# Patient Record
Sex: Female | Born: 1950 | Race: White | Hispanic: No | Smoking: Never smoker
Health system: Southern US, Community
[De-identification: ages and names within clinical notes are randomized; demographics above are authoritative.]

## PROBLEM LIST (undated history)

## (undated) DIAGNOSIS — I10 Essential (primary) hypertension: Secondary | ICD-10-CM

## (undated) DIAGNOSIS — F419 Anxiety disorder, unspecified: Secondary | ICD-10-CM

## (undated) DIAGNOSIS — I499 Cardiac arrhythmia, unspecified: Secondary | ICD-10-CM

## (undated) DIAGNOSIS — K219 Gastro-esophageal reflux disease without esophagitis: Secondary | ICD-10-CM

## (undated) DIAGNOSIS — M1990 Unspecified osteoarthritis, unspecified site: Secondary | ICD-10-CM

## (undated) DIAGNOSIS — E785 Hyperlipidemia, unspecified: Secondary | ICD-10-CM

## (undated) DIAGNOSIS — F32A Depression, unspecified: Secondary | ICD-10-CM

## (undated) DIAGNOSIS — Z974 Presence of external hearing-aid: Secondary | ICD-10-CM

## (undated) DIAGNOSIS — F329 Major depressive disorder, single episode, unspecified: Secondary | ICD-10-CM

## (undated) DIAGNOSIS — K760 Fatty (change of) liver, not elsewhere classified: Secondary | ICD-10-CM

## (undated) DIAGNOSIS — E039 Hypothyroidism, unspecified: Secondary | ICD-10-CM

## (undated) DIAGNOSIS — J42 Unspecified chronic bronchitis: Secondary | ICD-10-CM

## (undated) DIAGNOSIS — E079 Disorder of thyroid, unspecified: Secondary | ICD-10-CM

## (undated) HISTORY — PX: APPENDECTOMY: SHX54

## (undated) HISTORY — PX: LOOP RECORDER IMPLANT: SHX5954

## (undated) HISTORY — DX: Cardiac arrhythmia, unspecified: I49.9

## (undated) HISTORY — PX: ECTOPIC PREGNANCY SURGERY: SHX613

---

## 1975-10-15 HISTORY — PX: EXPLORATORY LAPAROTOMY: SUR591

## 1976-03-16 HISTORY — PX: LAPAROSCOPIC ABDOMINAL EXPLORATION: SHX6249

## 1978-03-16 HISTORY — PX: ECTOPIC PREGNANCY SURGERY: SHX613

## 1980-03-16 HISTORY — PX: CHOLECYSTECTOMY: SHX55

## 1993-03-16 HISTORY — PX: HERNIA REPAIR: SHX51

## 1998-05-20 ENCOUNTER — Other Ambulatory Visit: Admission: RE | Admit: 1998-05-20 | Discharge: 1998-05-20 | Payer: Self-pay | Admitting: Obstetrics and Gynecology

## 1999-06-11 ENCOUNTER — Other Ambulatory Visit: Admission: RE | Admit: 1999-06-11 | Discharge: 1999-06-11 | Payer: Self-pay | Admitting: *Deleted

## 2000-06-08 ENCOUNTER — Ambulatory Visit (HOSPITAL_COMMUNITY): Admission: RE | Admit: 2000-06-08 | Discharge: 2000-06-08 | Payer: Self-pay | Admitting: Gastroenterology

## 2000-06-14 ENCOUNTER — Other Ambulatory Visit: Admission: RE | Admit: 2000-06-14 | Discharge: 2000-06-14 | Payer: Self-pay | Admitting: *Deleted

## 2001-03-16 HISTORY — PX: DILATION AND CURETTAGE OF UTERUS: SHX78

## 2001-04-21 ENCOUNTER — Ambulatory Visit (HOSPITAL_COMMUNITY): Admission: RE | Admit: 2001-04-21 | Discharge: 2001-04-21 | Payer: Self-pay | Admitting: Obstetrics and Gynecology

## 2001-04-21 ENCOUNTER — Encounter (INDEPENDENT_AMBULATORY_CARE_PROVIDER_SITE_OTHER): Payer: Self-pay | Admitting: Specialist

## 2001-06-21 ENCOUNTER — Other Ambulatory Visit: Admission: RE | Admit: 2001-06-21 | Discharge: 2001-06-21 | Payer: Self-pay | Admitting: Obstetrics and Gynecology

## 2001-11-08 ENCOUNTER — Other Ambulatory Visit: Admission: RE | Admit: 2001-11-08 | Discharge: 2001-11-08 | Payer: Self-pay | Admitting: Obstetrics and Gynecology

## 2002-02-22 ENCOUNTER — Other Ambulatory Visit: Admission: RE | Admit: 2002-02-22 | Discharge: 2002-02-22 | Payer: Self-pay | Admitting: Obstetrics and Gynecology

## 2003-01-26 ENCOUNTER — Other Ambulatory Visit: Admission: RE | Admit: 2003-01-26 | Discharge: 2003-01-26 | Payer: Self-pay | Admitting: Obstetrics and Gynecology

## 2003-03-17 HISTORY — PX: SHOULDER ARTHROSCOPY: SHX128

## 2003-09-20 ENCOUNTER — Ambulatory Visit (HOSPITAL_COMMUNITY): Admission: RE | Admit: 2003-09-20 | Discharge: 2003-09-20 | Payer: Self-pay | Admitting: Orthopedic Surgery

## 2005-08-27 ENCOUNTER — Encounter: Payer: Self-pay | Admitting: Pulmonary Disease

## 2005-08-27 ENCOUNTER — Ambulatory Visit: Admission: RE | Admit: 2005-08-27 | Discharge: 2005-08-27 | Payer: Self-pay | Admitting: Family Medicine

## 2006-03-16 HISTORY — PX: REPLACEMENT UNICONDYLAR JOINT KNEE: SUR1227

## 2006-03-16 HISTORY — PX: KNEE ARTHROSCOPY: SUR90

## 2006-06-28 ENCOUNTER — Encounter: Admission: RE | Admit: 2006-06-28 | Discharge: 2006-06-28 | Payer: Self-pay | Admitting: Orthopedic Surgery

## 2007-04-08 ENCOUNTER — Encounter: Payer: Self-pay | Admitting: Pulmonary Disease

## 2007-06-24 ENCOUNTER — Encounter: Admission: RE | Admit: 2007-06-24 | Discharge: 2007-06-24 | Payer: Self-pay | Admitting: Occupational Therapy

## 2007-07-12 ENCOUNTER — Encounter: Admission: RE | Admit: 2007-07-12 | Discharge: 2007-07-12 | Payer: Self-pay | Admitting: Family Medicine

## 2008-03-16 HISTORY — PX: REPLACEMENT UNICONDYLAR JOINT KNEE: SUR1227

## 2008-03-16 HISTORY — PX: PARTIAL KNEE ARTHROPLASTY: SHX2174

## 2008-05-17 ENCOUNTER — Ambulatory Visit: Payer: Self-pay | Admitting: Pulmonary Disease

## 2008-05-17 DIAGNOSIS — R05 Cough: Secondary | ICD-10-CM

## 2008-05-17 DIAGNOSIS — R059 Cough, unspecified: Secondary | ICD-10-CM | POA: Insufficient documentation

## 2010-08-01 NOTE — Procedures (Signed)
[place]  Patient:    Amy Glenn, DELEO                    MRN: [id number] Proc. Date: 06/08/00 Attending:  Anselmo Rod, M.D. CC:         Sheronette A. Cherly Hensen, M.D.   Procedure Report  DATE OF BIRTH:  [birth year]  REFERRING PHYSICIAN:  Sheronette A. Cherly Hensen, M.D.  PROCEDURE PERFORMED:  Colonoscopy.  ENDOSCOPIST:  Anselmo Rod, M.D.  INSTRUMENT USED:  Olympus video colonoscope.  INDICATIONS FOR PROCEDURE:  Abdominal pain with gaseous bloating and guaiac positive stools in a 60 year old white female rule out polyps, masses, hemorrhoids, etc.  PREPROCEDURE PREPARATION:  Informed consent was procured from the patient. The patient was fasted for eight hours prior to the procedure and prepped with a bottle of magnesium citrate and a gallon of NuLytely the night prior to the procedure.  PREPROCEDURE PHYSICAL:  The patient had stable vital signs.  Neck supple. Chest clear to auscultation.  S1, S2 regular.  Abdomen soft with normal abdominal bowel sounds.  DESCRIPTION OF PROCEDURE:  The patient was placed in the left lateral decubitus position and sedated with 70 mg of Demerol and 7 mg of Versed intravenously.  Once the patient was adequately sedated and maintained on low-flow oxygen and continuous cardiac monitoring, the Olympus video colonoscope was advanced from the rectum to the cecum without difficulty. Except for small internal and external hemorrhoids seen on retroflexion and anal inspection, respectively, no other abnormalities were seen.  No masses, polyps, erosions or ulcerations were present.  The procedure was complete up to the terminal ileum.  IMPRESSION: 1. Normal-appearing terminal ileum and colon. 2. Small internal and external hemorrhoids.  RECOMMENDATIONS: 1. The patient is going to be started on Cipro 500 mg b.i.d. for the next    10 days along with Flagyl 250 mg t.i.d. for the next 10 days for  treatment of presumed bacterial overgrowth. 2. A high fiber diet has been recommended.  Gradual increase in the fiber    has been advised. 3. Follow-up in the office had been advised in the next four weeks. DD:  06/08/00 TD:  06/08/00 Job: [id number]

## 2010-08-01 NOTE — Op Note (Signed)
[facility]  Patient:    DORANNE, SCHMUTZ Visit Number: [id number] MRN: [id number]          Service Type: DSU Location: [facility] Attending Physician:  Maxie Better Dictated by:   Sheria Lang. Cherly Hensen, M.D. Proc. Date: 04/21/01 Admit Date:  04/21/2001                             Operative Report  PREOPERATIVE DIAGNOSES:       Dysfunctional uterine bleeding, endometrial mass, question submucosal fibroid.  PROCEDURE:                    Examination under anesthesia, diagnostic hysteroscopy, resection of submucosal fibroid, dilatation and curettage.  POSTOPERATIVE DIAGNOSES:      Dysfunctional uterine bleeding, submucosal fibroid.  ANESTHESIA:                   MAC, paracervical block.  PROCEDURE:                    Under adequate monitored anesthesia the patient was placed in a dorsal lithotomy position.  Examination under anesthesia revealed a small anteverted uterus.  No adnexal masses could be appreciated. The patient was sterilely prepped and draped in the usual fashion.  The bladder was not catheterized as the patient had voided prior to entering the room.  Bivalve speculum was placed in the vagina.  Single tooth tenaculum was placed on the anterior lip of the cervix.  The cervix was then serially dilated up to #25 [facility] dilator.  A 5 mm diagnostic hysteroscope was introduced into the uterine cavity.  Increased pressure was difficult to maintain.  Both tubal ostia were seen and were sclerosed.  The endometrium appeared thickened.  No defined endometrial polyp noted.  The hysteroscope was removed and the cervix further dilated up to #31 [facility] dilator and the resectoscope was then inserted.  It was noted at that time that the cervical length and the length of the internal os to the fundus was about equal distance and the hysteroscope was removed.  The cavity was then curetted.  On curettage there was a probably approximately 3 cm submucosal  fibroid on the anterior left aspect of the uterus.  The single loop was used to try to resect this.  However, in order to maintain adequate distention, the resectoscope needed to be pushed in further.  However, the fibroid was behind this instrument and therefore limited the ability to completely resect.  A portion of the fibroid was resected.  Cavity was curetted.  The procedure was terminated in order to prevent uterine perforation which the patient, I felt, was at risk given the confinement of the diameter length that I was dealing with.  The cavity was once again curetted.  All instruments were subsequently removed from the vagina, sent to pathology as endometrial curetting and submucosal fibroid resection.  Estimated blood loss was minimal.  Fluid deficit was 190 cc.  Complications were none.  The patient tolerated the procedure well and was transferred to the recovery room in stable condition. Dictated by:   Sheria Lang. Cherly Hensen, M.D. Attending Physician:  Maxie Better DD:  04/21/01 TD:  04/22/01 Job: [id number]

## 2012-07-04 ENCOUNTER — Other Ambulatory Visit: Payer: Self-pay | Admitting: Orthopaedic Surgery

## 2012-07-06 ENCOUNTER — Encounter (HOSPITAL_BASED_OUTPATIENT_CLINIC_OR_DEPARTMENT_OTHER): Payer: Self-pay | Admitting: *Deleted

## 2012-07-06 NOTE — Progress Notes (Signed)
No heart or resp problems Hx bronchitis but has done well the last few yrs.

## 2012-07-08 NOTE — H&P (Signed)
Amy Glenn is an 62 y.o. female.   Chief Complaint: Left knee pain HPI: Amy Glenn has increasing left knee pain after going to the gym.  This happened about a week or 2 ago.  She is having pain along the medial joint line worse with activity and better with rest.  She has had to take some pain medicine to sleep at nighttime.  Her exam shows a significant pain with palpation of the medial joint line and positive McMurray's.  Recent MRI scan is positive for torn medial meniscus.  We have discussed treatment options with her that being arthroscopy to take care of the meniscus tear and improve her function and decrease pain.  Past Medical History  Diagnosis Date  . Chronic bronchitis   . Depression   . GERD (gastroesophageal reflux disease)   . Arthritis   . Wears hearing aid     both ears    Past Surgical History  Procedure Laterality Date  . Exploratory laparotomy  8/77    for fertility  . Laparoscopic abdominal exploration  1978    repair fall tube scarring fertility  . Dilation and curettage of uterus  2003    fibroids  . Ectopic pregnancy surgery  1979x2    on rt  . Ectopic pregnancy surgery  1980    x2 left  . Knee arthroscopy  2008    rightx2  . Partial knee arthroplasty  2010    right  . Shoulder arthroscopy  2005    left  . Appendectomy    . Cholecystectomy  1982  . Hernia repair  1995    lt LA    History reviewed. No pertinent family history. Social History:  reports that she has never smoked. She does not have any smokeless tobacco history on file. She reports that  drinks alcohol. She reports that she does not use illicit drugs.  Allergies: No Known Allergies  No prescriptions prior to admission    No results found for this or any previous visit (from the past 48 hour(s)). No results found.  Review of Systems  Constitutional: Negative.   HENT: Negative.   Eyes: Negative.   Respiratory: Negative.   Cardiovascular: Negative.   Gastrointestinal:  Negative.   Genitourinary: Negative.   Musculoskeletal: Positive for joint pain.  Skin: Negative.   Neurological: Negative.   Endo/Heme/Allergies: Negative.   Psychiatric/Behavioral: Negative.     Height 5' 7.5" (1.715 m), weight 79.379 kg (175 lb). Physical Exam  Constitutional: She is oriented to person, place, and time. She appears well-nourished.  HENT:  Head: Atraumatic.  Eyes: Pupils are equal, round, and reactive to light.  Neck: Normal range of motion.  Cardiovascular: Regular rhythm.   Respiratory: Breath sounds normal.  GI: Bowel sounds are normal.  Musculoskeletal:  Left knee exam: Range of motion 0-1 20.  No fluid on her knee.  Pain along the medial joint line to palpation and a positive McMurray's medially.  Stable ligaments.  No lateral joint line pain and no crepitation.  Neurological: She is oriented to person, place, and time.  Skin: Skin is warm.  Psychiatric: She has a normal mood and affect.     Assessment/Plan Assessment: Left knee torn medial meniscus  Plan: We have discussed proceeding with a knee arthroscopy to relieve her pain and improve her function.  Have talked about the risk of anesthesia, infection and DVT associated with knee arthroscopy procedure.  Have also discussed with her the need for postoperative physical therapy to  optimize the results.  Edelmira Gallogly R 07/08/2012, 5:34 PM

## 2012-07-12 ENCOUNTER — Encounter (HOSPITAL_BASED_OUTPATIENT_CLINIC_OR_DEPARTMENT_OTHER)
Admission: RE | Disposition: A | Discharge status: Home / Self Care | Payer: Self-pay | Source: Ambulatory Visit | Attending: Orthopaedic Surgery

## 2012-07-12 ENCOUNTER — Encounter (HOSPITAL_BASED_OUTPATIENT_CLINIC_OR_DEPARTMENT_OTHER): Payer: Self-pay | Admitting: *Deleted

## 2012-07-12 ENCOUNTER — Encounter (HOSPITAL_BASED_OUTPATIENT_CLINIC_OR_DEPARTMENT_OTHER): Payer: Self-pay | Admitting: Anesthesiology

## 2012-07-12 ENCOUNTER — Ambulatory Visit (HOSPITAL_BASED_OUTPATIENT_CLINIC_OR_DEPARTMENT_OTHER): Payer: BC Managed Care – PPO | Admitting: Anesthesiology

## 2012-07-12 ENCOUNTER — Ambulatory Visit (HOSPITAL_BASED_OUTPATIENT_CLINIC_OR_DEPARTMENT_OTHER)
Admission: RE | Admit: 2012-07-12 | Discharge: 2012-07-12 | Disposition: A | Discharge status: Home / Self Care | Payer: BC Managed Care – PPO | Source: Ambulatory Visit | Attending: Orthopaedic Surgery | Admitting: Orthopaedic Surgery

## 2012-07-12 DIAGNOSIS — K219 Gastro-esophageal reflux disease without esophagitis: Secondary | ICD-10-CM | POA: Insufficient documentation

## 2012-07-12 DIAGNOSIS — S83242A Other tear of medial meniscus, current injury, left knee, initial encounter: Secondary | ICD-10-CM

## 2012-07-12 DIAGNOSIS — M129 Arthropathy, unspecified: Secondary | ICD-10-CM | POA: Insufficient documentation

## 2012-07-12 DIAGNOSIS — F3289 Other specified depressive episodes: Secondary | ICD-10-CM | POA: Insufficient documentation

## 2012-07-12 DIAGNOSIS — F329 Major depressive disorder, single episode, unspecified: Secondary | ICD-10-CM | POA: Insufficient documentation

## 2012-07-12 DIAGNOSIS — M23305 Other meniscus derangements, unspecified medial meniscus, unspecified knee: Secondary | ICD-10-CM | POA: Insufficient documentation

## 2012-07-12 HISTORY — DX: Unspecified chronic bronchitis: J42

## 2012-07-12 HISTORY — DX: Gastro-esophageal reflux disease without esophagitis: K21.9

## 2012-07-12 HISTORY — DX: Major depressive disorder, single episode, unspecified: F32.9

## 2012-07-12 HISTORY — DX: Presence of external hearing-aid: Z97.4

## 2012-07-12 HISTORY — DX: Unspecified osteoarthritis, unspecified site: M19.90

## 2012-07-12 HISTORY — DX: Depression, unspecified: F32.A

## 2012-07-12 HISTORY — PX: KNEE ARTHROSCOPY: SHX127

## 2012-07-12 LAB — POCT HEMOGLOBIN-HEMACUE: Hemoglobin: 12.6 g/dL (ref 12.0–15.0)

## 2012-07-12 SURGERY — ARTHROSCOPY, KNEE
Anesthesia: General | Site: Knee | Laterality: Left | Wound class: Clean

## 2012-07-12 MED ORDER — HYDROCODONE-ACETAMINOPHEN 5-325 MG PO TABS: 1.0000 | ORAL_TABLET | ORAL | Status: DC | PRN

## 2012-07-12 MED ORDER — PROMETHAZINE HCL 25 MG/ML IJ SOLN: 6.2500 mg | INTRAMUSCULAR | Status: DC | PRN

## 2012-07-12 MED ORDER — BUPIVACAINE-EPINEPHRINE 0.5% -1:200000 IJ SOLN
INTRAMUSCULAR | Status: DC | PRN
  Administered 2012-07-12: 20 mL

## 2012-07-12 MED ORDER — MIDAZOLAM HCL 5 MG/5ML IJ SOLN
INTRAMUSCULAR | Status: DC | PRN
  Administered 2012-07-12: 2 mg via INTRAVENOUS

## 2012-07-12 MED ORDER — FENTANYL CITRATE 0.05 MG/ML IJ SOLN
25.0000 ug | INTRAMUSCULAR | Status: DC | PRN
  Administered 2012-07-12 (×2): 50 ug via INTRAVENOUS

## 2012-07-12 MED ORDER — LIDOCAINE HCL (CARDIAC) 20 MG/ML IV SOLN
INTRAVENOUS | Status: DC | PRN
  Administered 2012-07-12: 100 mg via INTRAVENOUS

## 2012-07-12 MED ORDER — PROPOFOL 10 MG/ML IV BOLUS
INTRAVENOUS | Status: DC | PRN
  Administered 2012-07-12: 200 mg via INTRAVENOUS

## 2012-07-12 MED ORDER — DEXAMETHASONE SODIUM PHOSPHATE 4 MG/ML IJ SOLN
INTRAMUSCULAR | Status: DC | PRN
  Administered 2012-07-12: 10 mg via INTRAVENOUS

## 2012-07-12 MED ORDER — MIDAZOLAM HCL 2 MG/2ML IJ SOLN: 1.0000 mg | INTRAMUSCULAR | Status: DC | PRN

## 2012-07-12 MED ORDER — OXYCODONE HCL 5 MG PO TABS
5.0000 mg | ORAL_TABLET | Freq: Once | ORAL | Status: AC
  Administered 2012-07-12: 5 mg via ORAL

## 2012-07-12 MED ORDER — SODIUM CHLORIDE 0.9 % IR SOLN
Status: DC | PRN
  Administered 2012-07-12: 3000 mL

## 2012-07-12 MED ORDER — LACTATED RINGERS IV SOLN: INTRAVENOUS | Status: DC

## 2012-07-12 MED ORDER — FENTANYL CITRATE 0.05 MG/ML IJ SOLN
INTRAMUSCULAR | Status: DC | PRN
  Administered 2012-07-12: 100 ug via INTRAVENOUS

## 2012-07-12 MED ORDER — LACTATED RINGERS IV SOLN
INTRAVENOUS | Status: DC
  Administered 2012-07-12: 07:00:00 via INTRAVENOUS
  Administered 2012-07-12: 20 mL/h via INTRAVENOUS

## 2012-07-12 MED ORDER — CEFAZOLIN SODIUM-DEXTROSE 2-3 GM-% IV SOLR
2.0000 g | INTRAVENOUS | Status: AC
  Administered 2012-07-12: 2 g via INTRAVENOUS

## 2012-07-12 MED ORDER — MEPERIDINE HCL 25 MG/ML IJ SOLN: 6.2500 mg | INTRAMUSCULAR | Status: DC | PRN

## 2012-07-12 MED ORDER — FENTANYL CITRATE 0.05 MG/ML IJ SOLN: 50.0000 ug | INTRAMUSCULAR | Status: DC | PRN

## 2012-07-12 MED ORDER — CHLORHEXIDINE GLUCONATE 4 % EX LIQD: 60.0000 mL | Freq: Once | CUTANEOUS | Status: DC

## 2012-07-12 SURGICAL SUPPLY — 40 items
BANDAGE ELASTIC 6 VELCRO ST LF (GAUZE/BANDAGES/DRESSINGS) ×2 IMPLANT
BANDAGE GAUZE ELAST BULKY 4 IN (GAUZE/BANDAGES/DRESSINGS) ×2 IMPLANT
BLADE CUDA 5.5 (BLADE) IMPLANT
BLADE GREAT WHITE 4.2 (BLADE) ×2 IMPLANT
CANISTER OMNI JUG 16 LITER (MISCELLANEOUS) IMPLANT
CANISTER SUCTION 2500CC (MISCELLANEOUS) IMPLANT
DRAPE ARTHROSCOPY W/POUCH 114 (DRAPES) ×2 IMPLANT
DRAPE U-SHAPE 47X51 STRL (DRAPES) ×2 IMPLANT
DRSG EMULSION OIL 3X3 NADH (GAUZE/BANDAGES/DRESSINGS) ×2 IMPLANT
DURAPREP 26ML APPLICATOR (WOUND CARE) ×2 IMPLANT
ELECT MENISCUS 165MM 90D (ELECTRODE) IMPLANT
ELECT REM PT RETURN 9FT ADLT (ELECTROSURGICAL)
ELECTRODE REM PT RTRN 9FT ADLT (ELECTROSURGICAL) IMPLANT
GLOVE BIO SURGEON STRL SZ8.5 (GLOVE) ×2 IMPLANT
GLOVE BIOGEL PI IND STRL 6.5 (GLOVE) IMPLANT
GLOVE BIOGEL PI IND STRL 8 (GLOVE) ×1 IMPLANT
GLOVE BIOGEL PI IND STRL 8.5 (GLOVE) ×1 IMPLANT
GLOVE BIOGEL PI INDICATOR 6.5 (GLOVE) ×1
GLOVE BIOGEL PI INDICATOR 8 (GLOVE) ×1
GLOVE BIOGEL PI INDICATOR 8.5 (GLOVE) ×1
GLOVE ECLIPSE 6.5 STRL STRAW (GLOVE) ×1 IMPLANT
GLOVE SS BIOGEL STRL SZ 8 (GLOVE) ×1 IMPLANT
GLOVE SUPERSENSE BIOGEL SZ 8 (GLOVE) ×1
GOWN PREVENTION PLUS XLARGE (GOWN DISPOSABLE) ×3 IMPLANT
GOWN PREVENTION PLUS XXLARGE (GOWN DISPOSABLE) ×2 IMPLANT
IV NS IRRIG 3000ML ARTHROMATIC (IV SOLUTION) ×1 IMPLANT
KNEE WRAP E Z 3 GEL PACK (MISCELLANEOUS) ×2 IMPLANT
PACK ARTHROSCOPY DSU (CUSTOM PROCEDURE TRAY) ×2 IMPLANT
PACK BASIN DAY SURGERY FS (CUSTOM PROCEDURE TRAY) ×2 IMPLANT
PENCIL BUTTON HOLSTER BLD 10FT (ELECTRODE) IMPLANT
SET ARTHROSCOPY TUBING (MISCELLANEOUS) ×2
SET ARTHROSCOPY TUBING LN (MISCELLANEOUS) ×1 IMPLANT
SHEET MEDIUM DRAPE 40X70 STRL (DRAPES) ×2 IMPLANT
SPONGE GAUZE 4X4 12PLY (GAUZE/BANDAGES/DRESSINGS) ×2 IMPLANT
SYR 3ML 18GX1 1/2 (SYRINGE) IMPLANT
TOWEL OR 17X24 6PK STRL BLUE (TOWEL DISPOSABLE) ×2 IMPLANT
TOWEL OR NON WOVEN STRL DISP B (DISPOSABLE) ×1 IMPLANT
WAND 30 DEG SABER W/CORD (SURGICAL WAND) IMPLANT
WAND STAR VAC 90 (SURGICAL WAND) IMPLANT
WATER STERILE IRR 1000ML POUR (IV SOLUTION) ×2 IMPLANT

## 2012-07-12 NOTE — Transfer of Care (Signed)
Immediate Anesthesia Transfer of Care Note  Patient: Amy Glenn  Procedure(s) Performed: Procedure(s) with comments: LEFT KNEE ARTHROSCOPY  (Left) - Left knee arthroscopy with partial medial menisectomy and chondroplasty  Patient Location: PACU  Anesthesia Type:General  Level of Consciousness: awake  Airway & Oxygen Therapy: Patient Spontanous Breathing and Patient connected to face mask oxygen  Post-op Assessment: Report given to PACU RN and Post -op Vital signs reviewed and stable  Post vital signs: Reviewed and stable  Complications: No apparent anesthesia complications

## 2012-07-12 NOTE — Anesthesia Procedure Notes (Signed)
Procedure Name: LMA Insertion Date/Time: 07/12/2012 8:22 AM Performed by: Caren Macadam Pre-anesthesia Checklist: Patient identified, Emergency Drugs available, Suction available and Patient being monitored Patient Re-evaluated:Patient Re-evaluated prior to inductionOxygen Delivery Method: Circle System Utilized Preoxygenation: Pre-oxygenation with 100% oxygen Intubation Type: IV induction Ventilation: Mask ventilation without difficulty LMA: LMA inserted LMA Size: 4.0 Number of attempts: 1 Airway Equipment and Method: bite block Placement Confirmation: positive ETCO2 and breath sounds checked- equal and bilateral Tube secured with: Tape Dental Injury: Teeth and Oropharynx as per pre-operative assessment

## 2012-07-12 NOTE — Anesthesia Preprocedure Evaluation (Signed)
Anesthesia Evaluation  Patient identified by MRN, date of birth, ID band Patient awake    Reviewed: Allergy & Precautions, H&P , NPO status , Patient's Chart, lab work & pertinent test results  Airway Mallampati: II TM Distance: >3 FB Neck ROM: Full    Dental no notable dental hx.    Pulmonary neg pulmonary ROS,  breath sounds clear to auscultation  Pulmonary exam normal       Cardiovascular negative cardio ROS  Rhythm:Regular Rate:Normal     Neuro/Psych negative neurological ROS  negative psych ROS   GI/Hepatic negative GI ROS, Neg liver ROS, GERD-  Medicated and Controlled,  Endo/Other  negative endocrine ROS  Renal/GU negative Renal ROS  negative genitourinary   Musculoskeletal negative musculoskeletal ROS (+)   Abdominal   Peds negative pediatric ROS (+)  Hematology negative hematology ROS (+)   Anesthesia Other Findings   Reproductive/Obstetrics negative OB ROS                           Anesthesia Physical Anesthesia Plan  ASA: II  Anesthesia Plan: General   Post-op Pain Management:    Induction: Intravenous  Airway Management Planned: LMA  Additional Equipment:   Intra-op Plan:   Post-operative Plan:   Informed Consent: I have reviewed the patients History and Physical, chart, labs and discussed the procedure including the risks, benefits and alternatives for the proposed anesthesia with the patient or authorized representative who has indicated his/her understanding and acceptance.   Dental advisory given  Plan Discussed with: CRNA  Anesthesia Plan Comments:         Anesthesia Quick Evaluation

## 2012-07-12 NOTE — Interval H&P Note (Signed)
History and Physical Interval Note:  07/12/2012 8:14 AM  Amy Glenn  has presented today for surgery, with the diagnosis of LEFT KNEE MEDIAL MENISCUS TEAR CHONDROMALACIA   The various methods of treatment have been discussed with the patient and family. After consideration of risks, benefits and other options for treatment, the patient has consented to  Procedure(s): LEFT KNEE ARTHROSCOPY  (Left) as a surgical intervention .  The patient's history has been reviewed, patient examined, no change in status, stable for surgery.  I have reviewed the patient's chart and labs.  Questions were answered to the patient's satisfaction.     Romero Letizia G

## 2012-07-12 NOTE — Anesthesia Postprocedure Evaluation (Signed)
  Anesthesia Post-op Note  Patient: Amy Glenn  Procedure(s) Performed: Procedure(s) (LRB): LEFT KNEE ARTHROSCOPY  (Left)  Patient Location: PACU  Anesthesia Type: General  Level of Consciousness: awake and alert   Airway and Oxygen Therapy: Patient Spontanous Breathing  Post-op Pain: mild  Post-op Assessment: Post-op Vital signs reviewed, Patient's Cardiovascular Status Stable, Respiratory Function Stable, Patent Airway and No signs of Nausea or vomiting  Last Vitals:  Filed Vitals:   07/12/12 0901  BP: 126/84  Pulse: 96  Temp:   Resp:     Post-op Vital Signs: stable   Complications: No apparent anesthesia complications

## 2012-07-12 NOTE — Op Note (Signed)
#  [id number] 

## 2012-07-13 ENCOUNTER — Encounter (HOSPITAL_BASED_OUTPATIENT_CLINIC_OR_DEPARTMENT_OTHER): Payer: Self-pay | Admitting: Orthopaedic Surgery

## 2012-07-13 NOTE — Op Note (Signed)
[name], [name] NO.:  [id number]  MEDICAL RECORD NO.:  [id number]  LOCATION:                                 FACILITY:  PHYSICIAN:  Lubertha Basque. Tremond Shimabukuro, M.D.DATE OF BIRTH:  1951-03-01  DATE OF PROCEDURE:  07/12/2012 DATE OF DISCHARGE:                              OPERATIVE REPORT   PREOPERATIVE DIAGNOSES: 1. Left knee torn medial meniscus. 2. Left knee chondromalacia.  POSTOPERATIVE DIAGNOSES: 1. Left knee torn medial meniscus. 2. Left knee chondromalacia.  PROCEDURES: 1. Left knee partial medial meniscectomy. 2. Left knee abrasion chondroplasty, patellofemoral.  ANESTHESIA:  General.  ATTENDING SURGEON:  Lubertha Basque. Jerl Santos, M.D.  ASSISTANT:  Lindwood Qua, P.A.  INDICATION FOR PROCEDURE:  The patient is a 62 year old woman with a long history of left knee pain.  This has persisted despite oral anti- inflammatories and an injection, which did help for a month.  By MRI scan, she has an anterior horn of the medial meniscus tear.  She has pain, which limits her ability to rest and walk, and she has offered an arthroscopy.  Informed operative consent was obtained after discussion of possible complications including reaction to anesthesia and infection.  SUMMARY OF FINDINGS AND PROCEDURE:  Under general anesthesia, an arthroscopy of the left knee was performed.  The suprapatellar pouch was benign while the patellofemoral joint exhibited some focal breakdown in the intertrochlear groove addressed with a chondroplasty and abrasion to bleeding bone and some small areas.  The patella tracked normally.  In the medial compartment, she had a radial tear of the middle horn, which went out towards the periphery.  There were no degenerative changes in this compartment.  We performed about a 10% partial medial meniscectomy, smoothing off this area and contouring both sides of this tear to a smooth margin.  The ACL looked normal, and the lateral  compartment exhibited no evidence of meniscal or articular cartilage injury.  She was discharged home the same day.  DESCRIPTION OF PROCEDURE:  The patient was taken to the operating suite where general anesthetic was applied without difficulty.  She was positioned supine and prepped draped in the normal sterile fashion. After the administration of IV Kefzol, an appropriate time-out, an arthroscopy of the left knee was performed through total of 2 portals. Findings were as noted above and procedure consisted of the brief abrasion chondroplasty followed by the partial medial meniscectomy done with basket and shaver.  The knee was thoroughly irrigated at the end of the case followed by placement of Marcaine with epinephrine and morphine.  Adaptic was placed over the portals followed by dry gauze and loose Ace wrap.  ESTIMATED BLOOD LOSS AND FLUIDS:  Can be obtained from anesthesia records.  DISPOSITION:  The patient was extubated in the operating room and taken to recovery room in stable condition.  She had plans to go home same-day and follow up in the office closely.  I will contact her by phone tonight.     Lubertha Basque Jerl Santos, M.D.     PGD/MEDQ  D:  07/12/2012  T:  07/13/2012  Job:  [id number]

## 2013-04-28 DIAGNOSIS — K581 Irritable bowel syndrome with constipation: Secondary | ICD-10-CM | POA: Insufficient documentation

## 2013-04-28 DIAGNOSIS — F32A Depression, unspecified: Secondary | ICD-10-CM | POA: Insufficient documentation

## 2013-04-28 DIAGNOSIS — K219 Gastro-esophageal reflux disease without esophagitis: Secondary | ICD-10-CM | POA: Insufficient documentation

## 2014-02-01 ENCOUNTER — Other Ambulatory Visit: Payer: Self-pay | Admitting: Gastroenterology

## 2014-02-01 DIAGNOSIS — R1084 Generalized abdominal pain: Secondary | ICD-10-CM

## 2014-02-06 ENCOUNTER — Ambulatory Visit
Admission: RE | Admit: 2014-02-06 | Discharge: 2014-02-06 | Disposition: A | Discharge status: Home / Self Care | Payer: BC Managed Care – PPO | Source: Ambulatory Visit | Attending: Gastroenterology | Admitting: Gastroenterology

## 2014-02-06 DIAGNOSIS — R1084 Generalized abdominal pain: Secondary | ICD-10-CM

## 2014-02-06 MED ORDER — IOHEXOL 300 MG/ML  SOLN
100.0000 mL | Freq: Once | INTRAMUSCULAR | Status: AC | PRN
  Administered 2014-02-06: 100 mL via INTRAVENOUS

## 2017-01-24 ENCOUNTER — Other Ambulatory Visit: Payer: Self-pay

## 2017-01-24 ENCOUNTER — Encounter (HOSPITAL_BASED_OUTPATIENT_CLINIC_OR_DEPARTMENT_OTHER): Payer: Self-pay | Admitting: Emergency Medicine

## 2017-01-24 ENCOUNTER — Emergency Department (HOSPITAL_BASED_OUTPATIENT_CLINIC_OR_DEPARTMENT_OTHER)
Admission: EM | Admit: 2017-01-24 | Discharge: 2017-01-24 | Disposition: A | Discharge status: Home / Self Care | Payer: PPO | Attending: Emergency Medicine | Admitting: Emergency Medicine

## 2017-01-24 DIAGNOSIS — N23 Unspecified renal colic: Secondary | ICD-10-CM | POA: Diagnosis not present

## 2017-01-24 DIAGNOSIS — M545 Low back pain: Secondary | ICD-10-CM | POA: Diagnosis present

## 2017-01-24 DIAGNOSIS — Z79899 Other long term (current) drug therapy: Secondary | ICD-10-CM | POA: Diagnosis not present

## 2017-01-24 HISTORY — DX: Disorder of thyroid, unspecified: E07.9

## 2017-01-24 LAB — CBC WITH DIFFERENTIAL/PLATELET
Basophils Absolute: 0 10*3/uL (ref 0.0–0.1)
Basophils Relative: 1 %
EOS PCT: 4 %
Eosinophils Absolute: 0.3 10*3/uL (ref 0.0–0.7)
HCT: 42.3 % (ref 36.0–46.0)
Hemoglobin: 13.9 g/dL (ref 12.0–15.0)
LYMPHS ABS: 1.6 10*3/uL (ref 0.7–4.0)
Lymphocytes Relative: 26 %
MCH: 29.2 pg (ref 26.0–34.0)
MCHC: 32.9 g/dL (ref 30.0–36.0)
MCV: 88.9 fL (ref 78.0–100.0)
MONO ABS: 0.5 10*3/uL (ref 0.1–1.0)
Monocytes Relative: 9 %
Neutro Abs: 3.7 10*3/uL (ref 1.7–7.7)
Neutrophils Relative %: 60 %
PLATELETS: 271 10*3/uL (ref 150–400)
RBC: 4.76 MIL/uL (ref 3.87–5.11)
RDW: 13.6 % (ref 11.5–15.5)
WBC: 6 10*3/uL (ref 4.0–10.5)

## 2017-01-24 LAB — URINALYSIS, ROUTINE W REFLEX MICROSCOPIC
Bilirubin Urine: NEGATIVE
GLUCOSE, UA: NEGATIVE mg/dL
Ketones, ur: NEGATIVE mg/dL
Leukocytes, UA: NEGATIVE
Nitrite: NEGATIVE
PROTEIN: NEGATIVE mg/dL
SPECIFIC GRAVITY, URINE: 1.015 (ref 1.005–1.030)
pH: 7 (ref 5.0–8.0)

## 2017-01-24 LAB — URINALYSIS, MICROSCOPIC (REFLEX)

## 2017-01-24 LAB — COMPREHENSIVE METABOLIC PANEL
ALT: 75 U/L — ABNORMAL HIGH (ref 14–54)
AST: 45 U/L — ABNORMAL HIGH (ref 15–41)
Albumin: 3.8 g/dL (ref 3.5–5.0)
Alkaline Phosphatase: 51 U/L (ref 38–126)
Anion gap: 6 (ref 5–15)
BUN: 20 mg/dL (ref 6–20)
CHLORIDE: 107 mmol/L (ref 101–111)
CO2: 27 mmol/L (ref 22–32)
CREATININE: 0.75 mg/dL (ref 0.44–1.00)
Calcium: 8.8 mg/dL — ABNORMAL LOW (ref 8.9–10.3)
GFR calc Af Amer: >60 mL/min (ref >60)
Glucose, Bld: 124 mg/dL — ABNORMAL HIGH (ref 65–99)
POTASSIUM: 4.4 mmol/L (ref 3.5–5.1)
Sodium: 140 mmol/L (ref 135–145)
Total Bilirubin: 0.5 mg/dL (ref 0.3–1.2)
Total Protein: 6.3 g/dL — ABNORMAL LOW (ref 6.5–8.1)

## 2017-01-24 MED ORDER — NAPROXEN 500 MG PO TABS: 500.0000 mg | ORAL_TABLET | Freq: Two times a day (BID) | ORAL | 0 refills | Status: DC

## 2017-01-24 MED ORDER — OXYCODONE-ACETAMINOPHEN 5-325 MG PO TABS: 1.0000 | ORAL_TABLET | ORAL | 0 refills | Status: DC | PRN

## 2017-01-24 MED ORDER — TAMSULOSIN HCL 0.4 MG PO CAPS: 0.4000 mg | ORAL_CAPSULE | Freq: Two times a day (BID) | ORAL | 0 refills | Status: DC

## 2017-01-24 MED ORDER — HYDROMORPHONE HCL 1 MG/ML IJ SOLN
0.5000 mg | Freq: Once | INTRAMUSCULAR | Status: AC
  Administered 2017-01-24: 0.5 mg via INTRAVENOUS
  Filled 2017-01-24: qty 1

## 2017-01-24 MED ORDER — PROMETHAZINE HCL 25 MG PO TABS: 25.0000 mg | ORAL_TABLET | Freq: Four times a day (QID) | ORAL | 0 refills | Status: DC | PRN

## 2017-01-24 MED ORDER — KETOROLAC TROMETHAMINE 30 MG/ML IJ SOLN
30.0000 mg | Freq: Once | INTRAMUSCULAR | Status: AC
  Administered 2017-01-24: 30 mg via INTRAVENOUS
  Filled 2017-01-24: qty 1

## 2017-01-24 NOTE — ED Notes (Signed)
ED Provider at bedside. 

## 2017-01-24 NOTE — ED Notes (Signed)
Pt gave small urine sample, lab advised not enough urine obtained

## 2017-01-24 NOTE — ED Triage Notes (Signed)
R lower back pain radiating to lower abd with pressure in her bladder since 4 this morning. Pain described as sharp.

## 2017-01-24 NOTE — Discharge Instructions (Signed)
Your labs are reassuring You have blood in your urine which is expected with kidney stones This may take a few days to pass Take the medicines exactly as prescribed including:  Naprosyn twice daily Flomax twice daily Percocet for severe pain Phenergan for nausea  See the Urologist if no improvement by Wednesday

## 2017-01-24 NOTE — ED Provider Notes (Signed)
[facility] EMERGENCY DEPARTMENT Provider Note   CSN: [id number] Arrival date & time: 01/24/17  4098     History   Chief Complaint Chief Complaint  Patient presents with  . Back Pain    HPI [name] is a 66 y.o. female.  HPI  The patient is a 66 year old female, she has a history of multiple abdominal surgeries, appendectomy, cholecystectomy, multiple ectopic pregnancies, laparoscopic exploration and hernia repair.  She reports that 3 years ago she had a CT scan of the abdomen and pelvis that showed some nonobstructive nephrolithiasis.  She states that overnight last night she developed acute onset of abdominal pain which is her chief complaint today.  This flank and abdominal pain is on the right side, it is currently 7 out of 10 in intensity, it is sharp and stabbing and radiates from the right flank to the right groin.  She did become nauseated and vomited with this.  She has not noticed a change in her urine and did not become diaphoretic.  No fevers or chills, no dysuria hematuria or urinary frequency.  Denies any other symptoms at this time.  She did take ibuprofen several hours ago  Past Medical History:  Diagnosis Date  . Arthritis   . Chronic bronchitis ([place])   . Depression   . GERD (gastroesophageal reflux disease)   . Thyroid disease   . Wears hearing aid    both ears    Patient Active Problem List   Diagnosis Date Noted  . COUGH 05/17/2008    Past Surgical History:  Procedure Laterality Date  . APPENDECTOMY    . CHOLECYSTECTOMY  1982  . DILATION AND CURETTAGE OF UTERUS  2003   fibroids  . ECTOPIC PREGNANCY SURGERY  1979x2   on rt  . [place]   x2 left  . EXPLORATORY LAPAROTOMY  8/77   for fertility  . HERNIA REPAIR  1995   lt LA  . KNEE ARTHROSCOPY  2008   rightx2  . LAPAROSCOPIC ABDOMINAL EXPLORATION  1978   repair fall tube scarring fertility  . PARTIAL KNEE ARTHROPLASTY  2010   right  . SHOULDER  ARTHROSCOPY  2005   left    OB History    No data available       Home Medications    Prior to Admission medications   Medication Sig Start Date End Date Taking? Authorizing Provider  amitriptyline (ELAVIL) 10 MG tablet Take 10 mg by mouth at bedtime.   Yes [provider]  levothyroxine (SYNTHROID, LEVOTHROID) 75 MCG tablet Take 75 mcg daily before breakfast by mouth.   Yes [provider]  PARoxetine (PAXIL) 20 MG tablet Take 20 mg by mouth every morning.   Yes [provider]  albuterol (PROVENTIL HFA;VENTOLIN HFA) 108 (90 BASE) MCG/ACT inhaler Inhale 2 puffs into the lungs every 6 (six) hours as needed for wheezing.    [provider]  fish oil-omega-3 fatty acids 1000 MG capsule Take 2 g by mouth daily.    [provider]  Multiple Vitamins-Minerals (MULTIVITAMIN WITH MINERALS) tablet Take 1 tablet by mouth daily.    [provider]  naproxen (NAPROSYN) 500 MG tablet Take 1 tablet (500 mg total) 2 (two) times daily with a meal by mouth. 01/24/17   [name], MD  naproxen sodium (ANAPROX) 220 MG tablet Take 220 mg by mouth 2 (two) times daily with a meal.    [provider]  oxyCODONE-acetaminophen (PERCOCET)  5-325 MG tablet Take 1 tablet every 4 (four) hours as needed by mouth. 01/24/17   [name], MD  promethazine (PHENERGAN) 25 MG tablet Take 1 tablet (25 mg total) every 6 (six) hours as needed by mouth for nausea or vomiting. 01/24/17   [name], MD  tamsulosin (FLOMAX) 0.4 MG CAPS capsule Take 1 capsule (0.4 mg total) 2 (two) times daily by mouth. 01/24/17   [name], MD    Family History No family history on file.  Social History Social History   Tobacco Use  . Smoking status: Never Smoker  . Smokeless tobacco: Never Used  Substance Use Topics  . Alcohol use: Yes    Comment: occ  . Drug use: No     Allergies   Patient has no known allergies.   Review of Systems Review of Systems   All other systems reviewed and are negative.    Physical Exam Updated Vital Signs BP (!) 132/94 (BP Location: Right Arm)   Pulse 66   Temp 97.6 F (36.4 C) (Oral)   Resp 16   SpO2 99%   Physical Exam  Constitutional: She appears well-developed and well-nourished. No distress.  HENT:  Head: Normocephalic and atraumatic.  Mouth/Throat: Oropharynx is clear and moist. No oropharyngeal exudate.  Eyes: Conjunctivae and EOM are normal. Pupils are equal, round, and reactive to light. Right eye exhibits no discharge. Left eye exhibits no discharge. No scleral icterus.  Neck: Normal range of motion. Neck supple. No JVD present. No thyromegaly present.  Cardiovascular: Normal rate, regular rhythm, normal heart sounds and intact distal pulses. Exam reveals no gallop and no friction rub.  No murmur heard. Pulmonary/Chest: Effort normal and breath sounds normal. No respiratory distress. She has no wheezes. She has no rales.  Abdominal: Soft. Bowel sounds are normal. She exhibits no distension and no mass. There is no tenderness.  There is no abdominal tenderness, there is no flank tenderness or CVA tenderness.  No masses no guarding no peritoneal signs  Musculoskeletal: Normal range of motion. She exhibits no edema or tenderness.  Lymphadenopathy:    She has no cervical adenopathy.  Neurological: She is alert. Coordination normal.  The patient's gait is very stable speech is very clear  Skin: Skin is warm and dry. No rash noted. No erythema.  Psychiatric: She has a normal mood and affect. Her behavior is normal.  Nursing note and vitals reviewed.    ED Treatments / Results  Labs (all labs ordered are listed, but only abnormal results are displayed) Labs Reviewed  URINALYSIS, ROUTINE W REFLEX MICROSCOPIC - Abnormal; Notable for the following components:      Result Value   APPearance HAZY (*)    Hgb urine dipstick LARGE (*)    All other components within normal limits  COMPREHENSIVE  METABOLIC PANEL - Abnormal; Notable for the following components:   Glucose, Bld 124 (*)    Calcium 8.8 (*)    Total Protein 6.3 (*)    AST 45 (*)    ALT 75 (*)    All other components within normal limits  URINALYSIS, MICROSCOPIC (REFLEX) - Abnormal; Notable for the following components:   Bacteria, UA RARE (*)    Squamous Epithelial / LPF 6-30 (*)    All other components within normal limits  CBC WITH DIFFERENTIAL/PLATELET     Radiology No results found.  Procedures Procedures (including critical care time)  EMERGENCY DEPARTMENT US RENAL EXAM  "Study: Limited Retroperitoneal Ultrasound of Kidneys"  INDICATIONS:  Flank pain Long and short axis of both kidneys were obtained.   PERFORMED BY: Myself IMAGES ARCHIVED?: Yes LIMITATIONS: Body habitus VIEWS USED: Long axis, Short axis and Bladder  INTERPRETATION: Right Hydronephrosis mild, Left Renal cyst     Medications Ordered in ED Medications  ketorolac (TORADOL) 30 MG/ML injection 30 mg (30 mg Intravenous Given 01/24/17 0736)  HYDROmorphone (DILAUDID) injection 0.5 mg (0.5 mg Intravenous Given 01/24/17 0737)     Initial Impression / Assessment and Plan / ED Course  I have reviewed the triage vital signs and the nursing notes.  Pertinent labs & imaging results that were available during my care of the patient were reviewed by me and considered in my medical decision making (see chart for details).     The patient's symptoms match with that of nephrolithiasis or ureterolithiasis.  I will perform a quick bedside ultrasound.  It was noted on the prior CT scan that the stones were sub-5 mm and bilateral.  She does not appear acutely ill.  We will give some pain medication including an anti-inflammatory and check the urinalysis to rule out infection on top of possible kidney stone.  Due to the presence of hydronephrosis and the patient's prior history of kidney stones I do not feel that a CT scan is warranted.  The  diagnosis seems to be likely nephrolithiasis with a ureteral stone.  She will receive the medications, check urinalysis to rule out infection, anticipate discharge if symptoms well controlled.  Ultrasound confirms that the patient has hydronephrosis, hematuria in the urine without infection, no renal dysfunction, no leukocytosis, patient improved with medications given as above.  Stable for discharge on prescriptions as below.  Indications for return given, patient expressed understanding  Final Clinical Impressions(s) / ED Diagnoses   Final diagnoses:  Renal colic on right side    ED Discharge Orders        Ordered    tamsulosin (FLOMAX) 0.4 MG CAPS capsule  2 times daily     01/24/17 1154    naproxen (NAPROSYN) 500 MG tablet  2 times daily with meals     01/24/17 1154    oxyCODONE-acetaminophen (PERCOCET) 5-325 MG tablet  Every 4 hours PRN     01/24/17 1154    promethazine (PHENERGAN) 25 MG tablet  Every 6 hours PRN     01/24/17 1154       [name], MD 01/24/17 1157

## 2017-01-24 NOTE — ED Notes (Signed)
Pt states she drank all the water the RN gave, but still unable to void. RN made aware.

## 2017-01-24 NOTE — ED Notes (Signed)
Pt states unable to give urine sample at this time 

## 2017-01-26 DIAGNOSIS — N390 Urinary tract infection, site not specified: Secondary | ICD-10-CM | POA: Diagnosis not present

## 2017-01-26 DIAGNOSIS — N200 Calculus of kidney: Secondary | ICD-10-CM | POA: Diagnosis not present

## 2017-01-26 DIAGNOSIS — R309 Painful micturition, unspecified: Secondary | ICD-10-CM | POA: Diagnosis not present

## 2017-01-29 DIAGNOSIS — N23 Unspecified renal colic: Secondary | ICD-10-CM | POA: Diagnosis not present

## 2017-01-29 DIAGNOSIS — N201 Calculus of ureter: Secondary | ICD-10-CM | POA: Diagnosis not present

## 2017-03-23 DIAGNOSIS — E039 Hypothyroidism, unspecified: Secondary | ICD-10-CM | POA: Diagnosis not present

## 2017-04-23 DIAGNOSIS — M2021 Hallux rigidus, right foot: Secondary | ICD-10-CM | POA: Diagnosis not present

## 2017-04-23 DIAGNOSIS — M13871 Other specified arthritis, right ankle and foot: Secondary | ICD-10-CM | POA: Diagnosis not present

## 2017-04-23 DIAGNOSIS — M2012 Hallux valgus (acquired), left foot: Secondary | ICD-10-CM | POA: Diagnosis not present

## 2017-04-30 DIAGNOSIS — J069 Acute upper respiratory infection, unspecified: Secondary | ICD-10-CM | POA: Diagnosis not present

## 2017-04-30 DIAGNOSIS — J029 Acute pharyngitis, unspecified: Secondary | ICD-10-CM | POA: Diagnosis not present

## 2017-06-29 DIAGNOSIS — E039 Hypothyroidism, unspecified: Secondary | ICD-10-CM | POA: Diagnosis not present

## 2017-08-02 ENCOUNTER — Other Ambulatory Visit: Payer: Self-pay | Admitting: Family Medicine

## 2017-08-02 ENCOUNTER — Ambulatory Visit
Admission: RE | Admit: 2017-08-02 | Discharge: 2017-08-02 | Disposition: A | Discharge status: Home / Self Care | Payer: PPO | Source: Ambulatory Visit | Attending: Family Medicine | Admitting: Family Medicine

## 2017-08-02 DIAGNOSIS — R079 Chest pain, unspecified: Secondary | ICD-10-CM

## 2017-08-02 DIAGNOSIS — S299XXA Unspecified injury of thorax, initial encounter: Secondary | ICD-10-CM | POA: Diagnosis not present

## 2017-08-02 DIAGNOSIS — R0781 Pleurodynia: Secondary | ICD-10-CM | POA: Diagnosis not present

## 2017-11-19 DIAGNOSIS — H02831 Dermatochalasis of right upper eyelid: Secondary | ICD-10-CM | POA: Diagnosis not present

## 2017-11-19 DIAGNOSIS — H02834 Dermatochalasis of left upper eyelid: Secondary | ICD-10-CM | POA: Diagnosis not present

## 2017-11-19 DIAGNOSIS — H40003 Preglaucoma, unspecified, bilateral: Secondary | ICD-10-CM | POA: Diagnosis not present

## 2017-11-19 DIAGNOSIS — H43813 Vitreous degeneration, bilateral: Secondary | ICD-10-CM | POA: Diagnosis not present

## 2017-11-19 DIAGNOSIS — H26493 Other secondary cataract, bilateral: Secondary | ICD-10-CM | POA: Diagnosis not present

## 2017-11-19 DIAGNOSIS — H527 Unspecified disorder of refraction: Secondary | ICD-10-CM | POA: Diagnosis not present

## 2017-11-19 DIAGNOSIS — Z961 Presence of intraocular lens: Secondary | ICD-10-CM | POA: Diagnosis not present

## 2017-11-19 DIAGNOSIS — H338 Other retinal detachments: Secondary | ICD-10-CM | POA: Diagnosis not present

## 2017-12-10 DIAGNOSIS — Z1231 Encounter for screening mammogram for malignant neoplasm of breast: Secondary | ICD-10-CM | POA: Diagnosis not present

## 2017-12-16 DIAGNOSIS — R921 Mammographic calcification found on diagnostic imaging of breast: Secondary | ICD-10-CM | POA: Diagnosis not present

## 2017-12-16 DIAGNOSIS — N6012 Diffuse cystic mastopathy of left breast: Secondary | ICD-10-CM | POA: Diagnosis not present

## 2017-12-24 DIAGNOSIS — Z01419 Encounter for gynecological examination (general) (routine) without abnormal findings: Secondary | ICD-10-CM | POA: Diagnosis not present

## 2017-12-28 ENCOUNTER — Emergency Department (HOSPITAL_BASED_OUTPATIENT_CLINIC_OR_DEPARTMENT_OTHER): Payer: PPO

## 2017-12-28 ENCOUNTER — Other Ambulatory Visit: Payer: Self-pay

## 2017-12-28 ENCOUNTER — Emergency Department (HOSPITAL_BASED_OUTPATIENT_CLINIC_OR_DEPARTMENT_OTHER)
Admission: EM | Admit: 2017-12-28 | Discharge: 2017-12-28 | Disposition: A | Discharge status: Home / Self Care | Payer: PPO | Attending: Emergency Medicine | Admitting: Emergency Medicine

## 2017-12-28 ENCOUNTER — Encounter (HOSPITAL_BASED_OUTPATIENT_CLINIC_OR_DEPARTMENT_OTHER): Payer: Self-pay | Admitting: *Deleted

## 2017-12-28 DIAGNOSIS — Y939 Activity, unspecified: Secondary | ICD-10-CM | POA: Diagnosis not present

## 2017-12-28 DIAGNOSIS — S99921A Unspecified injury of right foot, initial encounter: Secondary | ICD-10-CM | POA: Diagnosis not present

## 2017-12-28 DIAGNOSIS — Z79899 Other long term (current) drug therapy: Secondary | ICD-10-CM | POA: Diagnosis not present

## 2017-12-28 DIAGNOSIS — J42 Unspecified chronic bronchitis: Secondary | ICD-10-CM | POA: Diagnosis not present

## 2017-12-28 DIAGNOSIS — S0031XA Abrasion of nose, initial encounter: Secondary | ICD-10-CM | POA: Insufficient documentation

## 2017-12-28 DIAGNOSIS — W0110XA Fall on same level from slipping, tripping and stumbling with subsequent striking against unspecified object, initial encounter: Secondary | ICD-10-CM | POA: Insufficient documentation

## 2017-12-28 DIAGNOSIS — S098XXA Other specified injuries of head, initial encounter: Secondary | ICD-10-CM | POA: Diagnosis not present

## 2017-12-28 DIAGNOSIS — S0990XA Unspecified injury of head, initial encounter: Secondary | ICD-10-CM | POA: Diagnosis not present

## 2017-12-28 DIAGNOSIS — M542 Cervicalgia: Secondary | ICD-10-CM | POA: Diagnosis not present

## 2017-12-28 DIAGNOSIS — Y929 Unspecified place or not applicable: Secondary | ICD-10-CM | POA: Insufficient documentation

## 2017-12-28 DIAGNOSIS — Y999 Unspecified external cause status: Secondary | ICD-10-CM | POA: Insufficient documentation

## 2017-12-28 DIAGNOSIS — S199XXA Unspecified injury of neck, initial encounter: Secondary | ICD-10-CM | POA: Diagnosis not present

## 2017-12-28 DIAGNOSIS — R51 Headache: Secondary | ICD-10-CM | POA: Diagnosis not present

## 2017-12-28 NOTE — ED Notes (Signed)
Tripped over a tree limb pta. Alert, c/o facial pain. Abrasion bridge of nose. C/o nausea after and current. No LOC. Pt states tripped and that is why she fell.

## 2017-12-28 NOTE — ED Triage Notes (Signed)
Pt c/o fall from standing landing on cement x 1 hr ago , lac to bridge of nose , no LOC

## 2017-12-28 NOTE — Discharge Instructions (Addendum)
You were found to have a right thyroid nodule, this will require non-emergent Thyroid Ultrasound follow-up. Please speak with your primary care physician about scheduling this

## 2017-12-29 NOTE — ED Provider Notes (Signed)
[facility] EMERGENCY DEPARTMENT Provider Note   CSN: [id number] Arrival date & time: 12/28/17  1304     History   Chief Complaint Chief Complaint  Patient presents with  . Head Injury    HPI Amy Glenn is a 67 y.o. female.  HPI Patient is a 67 year old female who reports tripping over a tree limb today while outside resulting in a fall forward and an abrasion on her nose.  She reports mild headache at this time.  She developed somewhat immediate nausea without vomiting.  No loss consciousness.  Mild headache at this time and mild neck pain without weakness or paresthesias of her upper or lower extremities.  No chest pain or shortness of breath.  No palpitations.  She does not have significant pain in her nose simply just an abrasion.  No other complaints.  Denies he is what concerned her the most and thus prompted her visit to the emergency department.   Past Medical History:  Diagnosis Date  . Arthritis   . Chronic bronchitis ([place])   . Depression   . GERD (gastroesophageal reflux disease)   . Thyroid disease   . Wears hearing aid    both ears    Patient Active Problem List   Diagnosis Date Noted  . COUGH 05/17/2008    Past Surgical History:  Procedure Laterality Date  . APPENDECTOMY    . CHOLECYSTECTOMY  1982  . DILATION AND CURETTAGE OF UTERUS  2003   fibroids  . ECTOPIC PREGNANCY SURGERY  1979x2   on rt  . [place]   x2 left  . EXPLORATORY LAPAROTOMY  8/77   for fertility  . HERNIA REPAIR  1995   lt LA  . KNEE ARTHROSCOPY  2008   rightx2  . KNEE ARTHROSCOPY Left 07/12/2012   Procedure: LEFT KNEE ARTHROSCOPY ;  Surgeon: Hessie Dibble, MD;  Location: [facility];  Service: Orthopedics;  Laterality: Left;  Left knee arthroscopy with partial medial menisectomy and chondroplasty  . LAPAROSCOPIC ABDOMINAL EXPLORATION  1978   repair fall tube scarring fertility  . PARTIAL KNEE ARTHROPLASTY  2010   right    . SHOULDER ARTHROSCOPY  2005   left     OB History   None      Home Medications    Prior to Admission medications   Medication Sig Start Date End Date Taking? Authorizing Provider  albuterol (PROVENTIL HFA;VENTOLIN HFA) 108 (90 BASE) MCG/ACT inhaler Inhale 2 puffs into the lungs every 6 (six) hours as needed for wheezing.    [provider]  amitriptyline (ELAVIL) 10 MG tablet Take 10 mg by mouth at bedtime.    [provider]  fish oil-omega-3 fatty acids 1000 MG capsule Take 2 g by mouth daily.    [provider]  levothyroxine (SYNTHROID, LEVOTHROID) 75 MCG tablet Take 75 mcg daily before breakfast by mouth.    [provider]  Multiple Vitamins-Minerals (MULTIVITAMIN WITH MINERALS) tablet Take 1 tablet by mouth daily.    [provider]  naproxen (NAPROSYN) 500 MG tablet Take 1 tablet (500 mg total) 2 (two) times daily with a meal by mouth. 01/24/17   [name], MD  naproxen sodium (ANAPROX) 220 MG tablet Take 220 mg by mouth 2 (two) times daily with a meal.    [provider]  oxyCODONE-acetaminophen (PERCOCET) 5-325 MG tablet Take 1 tablet every 4 (four) hours as needed by mouth. 01/24/17   [name],  MD  PARoxetine (PAXIL) 20 MG tablet Take 20 mg by mouth every morning.    [provider]  promethazine (PHENERGAN) 25 MG tablet Take 1 tablet (25 mg total) every 6 (six) hours as needed by mouth for nausea or vomiting. 01/24/17   [name], MD  tamsulosin (FLOMAX) 0.4 MG CAPS capsule Take 1 capsule (0.4 mg total) 2 (two) times daily by mouth. 01/24/17   [name], MD    Family History History reviewed. No pertinent family history.  Social History Social History   Tobacco Use  . Smoking status: Never Smoker  . Smokeless tobacco: Never Used  Substance Use Topics  . Alcohol use: Yes    Comment: occ  . Drug use: No     Allergies   Patient has no known allergies.   Review of Systems Review  of Systems  All other systems reviewed and are negative.    Physical Exam Updated Vital Signs BP 133/75 (BP Location: Right Arm)   Pulse 86   Temp 99 F (37.2 C)   Resp 18   Ht 5\' 8"  (1.727 m)   Wt 83.9 kg   SpO2 99%   BMI 28.13 kg/m   Physical Exam  Constitutional: She is oriented to person, place, and time. She appears well-developed and well-nourished. No distress.  HENT:  Head: Normocephalic.  Abrasion to the bridge of her nose without significant swelling or tenderness.  Eyes: Pupils are equal, round, and reactive to light. EOM are normal.  Neck: Normal range of motion. Neck supple.  Mild cervical and paracervical tenderness without cervical step-off.  Cardiovascular: Normal rate, regular rhythm and normal heart sounds.  Pulmonary/Chest: Effort normal and breath sounds normal.  Abdominal: Soft. She exhibits no distension. There is no tenderness.  Musculoskeletal: Normal range of motion.  Full range of motion of bilateral upper and lower extremity major joints.  Neurological: She is alert and oriented to person, place, and time.  5/5 strength in major muscle groups of  bilateral upper and lower extremities. Speech normal. No facial asymetry.   Skin: Skin is warm and dry.  Psychiatric: She has a normal mood and affect. Judgment normal.  Nursing note and vitals reviewed.    ED Treatments / Results  Labs (all labs ordered are listed, but only abnormal results are displayed) Labs Reviewed - No data to display  EKG None  Radiology Ct Head Wo Contrast  Result Date: 12/28/2017 CLINICAL DATA:  67 year old female status post fall from standing on cement. Bridge of nose laceration. Head pain. EXAM: CT HEAD WITHOUT CONTRAST CT CERVICAL SPINE WITHOUT CONTRAST TECHNIQUE: Multidetector CT imaging of the head and cervical spine was performed following the standard protocol without intravenous contrast. Multiplanar CT image reconstructions of the cervical spine were also  generated. COMPARISON:  Paranasal sinus CT 05/24/2007. FINDINGS: CT HEAD FINDINGS Brain: No midline shift, ventriculomegaly, mass effect, evidence of mass lesion, intracranial hemorrhage or evidence of cortically based acute infarction. Gray-white matter differentiation is within normal limits throughout the brain. Vascular: Mild Calcified atherosclerosis at the skull base. No suspicious intracranial vascular hyperdensity. Skull: Moderate to severe right TMJ degeneration right mandible condyle sclerosis and subchondral cysts. No skull fracture. Sinuses/Orbits: Visualized paranasal sinuses and mastoids are stable and well pneumatized. Incidental bilateral hearing aids. Other: Visualized scalp soft tissues are within normal limits. Postoperative changes to both globes otherwise negative visible orbits soft tissues. Visible nasal bridge appears unremarkable. CT CERVICAL SPINE FINDINGS Alignment: Straightening of cervical lordosis. Mild degenerative appearing anterolisthesis  of C4 on C5, associated with moderate right facet hypertrophy. Cervicothoracic junction alignment is within normal limits. Bilateral posterior element alignment is within normal limits. Skull base and vertebrae: Visualized skull base is intact. No atlanto-occipital dissociation. No cervical spine fracture. Soft tissues and spinal canal: No prevertebral fluid or swelling. No visible canal hematoma. There is a 2 centimeter probable exophytic hypodense right thyroid lobe nodule on series 7, image 55. Otherwise negative visible noncontrast neck soft tissues. Disc levels: Advanced chronic disc and endplate degeneration at C6-C7. No definite spinal stenosis. Multilevel cervical facet hypertrophy. Upper chest: Visible upper thoracic levels appear intact. Negative lung apices. IMPRESSION: 1. No acute traumatic injury identified in the head or cervical spine. 2.  Normal for age non contrast CT appearance of the brain. 3. Suspected 2 cm exophytic right  thyroid nodule meets consensus criteria for non-emergent Thyroid Ultrasound follow-up. Electronically Signed   By: Genevie Ann M.D.   On: 12/28/2017 15:24   Ct Cervical Spine Wo Contrast  Result Date: 12/28/2017 CLINICAL DATA:  67 year old female status post fall from standing on cement. Bridge of nose laceration. Head pain. EXAM: CT HEAD WITHOUT CONTRAST CT CERVICAL SPINE WITHOUT CONTRAST TECHNIQUE: Multidetector CT imaging of the head and cervical spine was performed following the standard protocol without intravenous contrast. Multiplanar CT image reconstructions of the cervical spine were also generated. COMPARISON:  Paranasal sinus CT 05/24/2007. FINDINGS: CT HEAD FINDINGS Brain: No midline shift, ventriculomegaly, mass effect, evidence of mass lesion, intracranial hemorrhage or evidence of cortically based acute infarction. Gray-white matter differentiation is within normal limits throughout the brain. Vascular: Mild Calcified atherosclerosis at the skull base. No suspicious intracranial vascular hyperdensity. Skull: Moderate to severe right TMJ degeneration right mandible condyle sclerosis and subchondral cysts. No skull fracture. Sinuses/Orbits: Visualized paranasal sinuses and mastoids are stable and well pneumatized. Incidental bilateral hearing aids. Other: Visualized scalp soft tissues are within normal limits. Postoperative changes to both globes otherwise negative visible orbits soft tissues. Visible nasal bridge appears unremarkable. CT CERVICAL SPINE FINDINGS Alignment: Straightening of cervical lordosis. Mild degenerative appearing anterolisthesis of C4 on C5, associated with moderate right facet hypertrophy. Cervicothoracic junction alignment is within normal limits. Bilateral posterior element alignment is within normal limits. Skull base and vertebrae: Visualized skull base is intact. No atlanto-occipital dissociation. No cervical spine fracture. Soft tissues and spinal canal: No prevertebral  fluid or swelling. No visible canal hematoma. There is a 2 centimeter probable exophytic hypodense right thyroid lobe nodule on series 7, image 55. Otherwise negative visible noncontrast neck soft tissues. Disc levels: Advanced chronic disc and endplate degeneration at C6-C7. No definite spinal stenosis. Multilevel cervical facet hypertrophy. Upper chest: Visible upper thoracic levels appear intact. Negative lung apices. IMPRESSION: 1. No acute traumatic injury identified in the head or cervical spine. 2.  Normal for age non contrast CT appearance of the brain. 3. Suspected 2 cm exophytic right thyroid nodule meets consensus criteria for non-emergent Thyroid Ultrasound follow-up. Electronically Signed   By: Genevie Ann M.D.   On: 12/28/2017 15:24   Dg Foot Complete Right  Result Date: 12/28/2017 CLINICAL DATA:  Fall EXAM: RIGHT FOOT COMPLETE - 3+ VIEW COMPARISON:  None. FINDINGS: Negative for fracture. Degenerative change at the first tarsal metatarsal joint with joint space narrowing and spurring. Mild degenerative change in spurring involving the medial midfoot. No erosion. Mild degenerative change in the first MTP. IMPRESSION: Mild degenerative changes first MTP and medial midfoot. No acute fracture. Electronically Signed   By: Franchot Gallo  M.D.   On: 12/28/2017 15:48    Procedures Procedures (including critical care time)  Medications Ordered in ED Medications - No data to display   Initial Impression / Assessment and Plan / ED Course  I have reviewed the triage vital signs and the nursing notes.  Pertinent labs & imaging results that were available during my care of the patient were reviewed by me and considered in my medical decision making (see chart for details).     Work-up in the emergency department without traumatic abnormality.  Abrasion to the nose does not need repair.  Recommended topical antibiotics.  Standard infection warnings given.  Standard head injury warnings given.   Patient encouraged to return to the emergency department for new or worsening symptoms.  Full range of motion of major joints.  Final Clinical Impressions(s) / ED Diagnoses   Final diagnoses:  Closed head injury, initial encounter  Abrasion of nose, initial encounter    ED Discharge Orders    None       Jola Schmidt, MD 12/29/17 [phone]

## 2018-01-12 DIAGNOSIS — N950 Postmenopausal bleeding: Secondary | ICD-10-CM | POA: Diagnosis not present

## 2018-01-15 DIAGNOSIS — R030 Elevated blood-pressure reading, without diagnosis of hypertension: Secondary | ICD-10-CM | POA: Diagnosis not present

## 2018-01-15 DIAGNOSIS — H66001 Acute suppurative otitis media without spontaneous rupture of ear drum, right ear: Secondary | ICD-10-CM | POA: Diagnosis not present

## 2018-01-21 DIAGNOSIS — H60333 Swimmer's ear, bilateral: Secondary | ICD-10-CM | POA: Diagnosis not present

## 2018-01-21 DIAGNOSIS — H6123 Impacted cerumen, bilateral: Secondary | ICD-10-CM | POA: Diagnosis not present

## 2018-02-04 DIAGNOSIS — K625 Hemorrhage of anus and rectum: Secondary | ICD-10-CM | POA: Diagnosis not present

## 2018-02-04 DIAGNOSIS — R945 Abnormal results of liver function studies: Secondary | ICD-10-CM | POA: Diagnosis not present

## 2018-02-04 DIAGNOSIS — E041 Nontoxic single thyroid nodule: Secondary | ICD-10-CM | POA: Diagnosis not present

## 2018-02-04 DIAGNOSIS — G4700 Insomnia, unspecified: Secondary | ICD-10-CM | POA: Diagnosis not present

## 2018-02-04 DIAGNOSIS — Z23 Encounter for immunization: Secondary | ICD-10-CM | POA: Diagnosis not present

## 2018-02-09 ENCOUNTER — Other Ambulatory Visit: Payer: Self-pay | Admitting: Family Medicine

## 2018-02-09 DIAGNOSIS — E041 Nontoxic single thyroid nodule: Secondary | ICD-10-CM

## 2018-02-16 ENCOUNTER — Other Ambulatory Visit: Payer: Self-pay | Admitting: Family Medicine

## 2018-02-16 DIAGNOSIS — R7989 Other specified abnormal findings of blood chemistry: Secondary | ICD-10-CM

## 2018-02-16 DIAGNOSIS — R945 Abnormal results of liver function studies: Principal | ICD-10-CM

## 2018-02-23 ENCOUNTER — Other Ambulatory Visit: Payer: PPO

## 2018-02-24 ENCOUNTER — Ambulatory Visit
Admission: RE | Admit: 2018-02-24 | Discharge: 2018-02-24 | Disposition: A | Discharge status: Home / Self Care | Payer: PPO | Source: Ambulatory Visit | Attending: Family Medicine | Admitting: Family Medicine

## 2018-02-24 DIAGNOSIS — R7989 Other specified abnormal findings of blood chemistry: Secondary | ICD-10-CM

## 2018-02-24 DIAGNOSIS — R945 Abnormal results of liver function studies: Principal | ICD-10-CM

## 2018-02-24 DIAGNOSIS — K760 Fatty (change of) liver, not elsewhere classified: Secondary | ICD-10-CM | POA: Diagnosis not present

## 2018-02-24 DIAGNOSIS — E041 Nontoxic single thyroid nodule: Secondary | ICD-10-CM

## 2018-03-02 ENCOUNTER — Other Ambulatory Visit: Payer: Self-pay | Admitting: Family Medicine

## 2018-03-02 DIAGNOSIS — E041 Nontoxic single thyroid nodule: Secondary | ICD-10-CM

## 2018-03-25 DIAGNOSIS — R945 Abnormal results of liver function studies: Secondary | ICD-10-CM | POA: Diagnosis not present

## 2018-03-25 DIAGNOSIS — R7989 Other specified abnormal findings of blood chemistry: Secondary | ICD-10-CM | POA: Insufficient documentation

## 2018-03-25 DIAGNOSIS — R932 Abnormal findings on diagnostic imaging of liver and biliary tract: Secondary | ICD-10-CM | POA: Diagnosis not present

## 2018-03-25 DIAGNOSIS — Z0000 Encounter for general adult medical examination without abnormal findings: Secondary | ICD-10-CM | POA: Insufficient documentation

## 2018-03-25 DIAGNOSIS — Z9889 Other specified postprocedural states: Secondary | ICD-10-CM | POA: Diagnosis not present

## 2018-03-30 ENCOUNTER — Other Ambulatory Visit: Payer: Self-pay | Admitting: Gastroenterology

## 2018-03-30 ENCOUNTER — Ambulatory Visit
Admission: RE | Admit: 2018-03-30 | Discharge: 2018-03-30 | Disposition: A | Discharge status: Home / Self Care | Payer: PPO | Source: Ambulatory Visit | Attending: Family Medicine | Admitting: Family Medicine

## 2018-03-30 ENCOUNTER — Other Ambulatory Visit (HOSPITAL_COMMUNITY)
Admission: RE | Admit: 2018-03-30 | Discharge: 2018-03-30 | Disposition: A | Discharge status: Home / Self Care | Payer: PPO | Source: Ambulatory Visit | Attending: Radiology | Admitting: Radiology

## 2018-03-30 DIAGNOSIS — E041 Nontoxic single thyroid nodule: Secondary | ICD-10-CM

## 2018-03-30 DIAGNOSIS — R932 Abnormal findings on diagnostic imaging of liver and biliary tract: Secondary | ICD-10-CM

## 2018-04-04 ENCOUNTER — Other Ambulatory Visit: Payer: PPO

## 2018-04-08 ENCOUNTER — Ambulatory Visit
Admission: RE | Admit: 2018-04-08 | Discharge: 2018-04-08 | Disposition: A | Discharge status: Home / Self Care | Payer: PPO | Source: Ambulatory Visit | Attending: Gastroenterology | Admitting: Gastroenterology

## 2018-04-08 DIAGNOSIS — K7689 Other specified diseases of liver: Secondary | ICD-10-CM | POA: Diagnosis not present

## 2018-04-08 DIAGNOSIS — R932 Abnormal findings on diagnostic imaging of liver and biliary tract: Secondary | ICD-10-CM

## 2018-04-22 DIAGNOSIS — Z0000 Encounter for general adult medical examination without abnormal findings: Secondary | ICD-10-CM | POA: Diagnosis not present

## 2018-04-22 DIAGNOSIS — R932 Abnormal findings on diagnostic imaging of liver and biliary tract: Secondary | ICD-10-CM | POA: Diagnosis not present

## 2018-04-22 DIAGNOSIS — R945 Abnormal results of liver function studies: Secondary | ICD-10-CM | POA: Diagnosis not present

## 2018-04-22 DIAGNOSIS — E041 Nontoxic single thyroid nodule: Secondary | ICD-10-CM | POA: Diagnosis not present

## 2018-05-24 ENCOUNTER — Other Ambulatory Visit: Payer: Self-pay | Admitting: Gastroenterology

## 2018-05-24 DIAGNOSIS — R945 Abnormal results of liver function studies: Principal | ICD-10-CM

## 2018-05-24 DIAGNOSIS — R7989 Other specified abnormal findings of blood chemistry: Secondary | ICD-10-CM

## 2018-06-16 DIAGNOSIS — M5126 Other intervertebral disc displacement, lumbar region: Secondary | ICD-10-CM | POA: Diagnosis not present

## 2018-06-16 DIAGNOSIS — I495 Sick sinus syndrome: Secondary | ICD-10-CM | POA: Diagnosis not present

## 2018-06-16 DIAGNOSIS — I10 Essential (primary) hypertension: Secondary | ICD-10-CM | POA: Diagnosis not present

## 2018-06-16 DIAGNOSIS — E1121 Type 2 diabetes mellitus with diabetic nephropathy: Secondary | ICD-10-CM | POA: Diagnosis not present

## 2018-06-16 DIAGNOSIS — C44519 Basal cell carcinoma of skin of other part of trunk: Secondary | ICD-10-CM | POA: Diagnosis not present

## 2018-06-16 DIAGNOSIS — M5440 Lumbago with sciatica, unspecified side: Secondary | ICD-10-CM | POA: Diagnosis not present

## 2018-06-16 DIAGNOSIS — L821 Other seborrheic keratosis: Secondary | ICD-10-CM | POA: Diagnosis not present

## 2018-06-16 DIAGNOSIS — E782 Mixed hyperlipidemia: Secondary | ICD-10-CM | POA: Diagnosis not present

## 2018-06-16 DIAGNOSIS — Z950 Presence of cardiac pacemaker: Secondary | ICD-10-CM | POA: Diagnosis not present

## 2018-06-16 DIAGNOSIS — D485 Neoplasm of uncertain behavior of skin: Secondary | ICD-10-CM | POA: Diagnosis not present

## 2018-06-16 DIAGNOSIS — I4821 Permanent atrial fibrillation: Secondary | ICD-10-CM | POA: Diagnosis not present

## 2018-07-06 DIAGNOSIS — C44519 Basal cell carcinoma of skin of other part of trunk: Secondary | ICD-10-CM | POA: Diagnosis not present

## 2018-08-10 ENCOUNTER — Other Ambulatory Visit: Payer: PPO

## 2018-08-10 ENCOUNTER — Ambulatory Visit
Admission: RE | Admit: 2018-08-10 | Discharge: 2018-08-10 | Disposition: A | Discharge status: Home / Self Care | Payer: PPO | Source: Ambulatory Visit | Attending: Gastroenterology | Admitting: Gastroenterology

## 2018-08-10 ENCOUNTER — Other Ambulatory Visit: Payer: Self-pay

## 2018-08-10 DIAGNOSIS — R945 Abnormal results of liver function studies: Secondary | ICD-10-CM | POA: Diagnosis not present

## 2018-08-10 DIAGNOSIS — R7989 Other specified abnormal findings of blood chemistry: Secondary | ICD-10-CM

## 2018-08-10 MED ORDER — GADOBENATE DIMEGLUMINE 529 MG/ML IV SOLN
17.0000 mL | Freq: Once | INTRAVENOUS | Status: AC | PRN
  Administered 2018-08-10: 17 mL via INTRAVENOUS

## 2018-08-24 ENCOUNTER — Ambulatory Visit (INDEPENDENT_AMBULATORY_CARE_PROVIDER_SITE_OTHER): Payer: PPO | Admitting: Orthopaedic Surgery

## 2018-08-24 ENCOUNTER — Ambulatory Visit (INDEPENDENT_AMBULATORY_CARE_PROVIDER_SITE_OTHER): Payer: PPO

## 2018-08-24 ENCOUNTER — Encounter: Payer: Self-pay | Admitting: Orthopaedic Surgery

## 2018-08-24 ENCOUNTER — Other Ambulatory Visit: Payer: Self-pay

## 2018-08-24 DIAGNOSIS — M25561 Pain in right knee: Secondary | ICD-10-CM | POA: Diagnosis not present

## 2018-08-24 NOTE — Progress Notes (Signed)
Office Visit Note   Patient: Amy Glenn           Date of Birth: Jan 29, 1951           MRN: [id number] Visit Date: 08/24/2018              Requested by: London Pepper, MD [address], [place] [address] PCP: London Pepper, MD   Assessment & Plan: Visit Diagnoses:  1. Right knee pain, unspecified chronicity     Plan: Likely she is just strained her knee and she should do well with time.  She did request an intra-articular injection of a steroid and I am agreeable to this trying this just one time.  I explained the risk and benefits of injections in light of having a partial knee replacement and how this can increase blood glucose.  She tolerated well.  All question concerns were answered and addressed.  Follow-up will be as needed.  I did tell her though that if her knee keeps bothering her and does not get better she should give [place] a call because and I would recommend an MRI of the right knee.  Follow-Up Instructions: Return if symptoms worsen or fail to improve.   Orders:  Orders Placed This Encounter  Procedures  . XR Knee 1-2 Views Right   No orders of the defined types were placed in this encounter.     Procedures: No procedures performed   Clinical Data: No additional findings.   Subjective: Chief Complaint  Patient presents with  . 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  These were done in [place].  The right knee was done between 10 and 11 years ago.  She has not had any issues with her knees at all until a month ago when she was doing housework and housekeeping activities she was down her knees and got up and felt something happen on the medial aspect of her knee as if she tore a medial meniscus.  It has been painful on that medial aspect of her knee since then.  She denies any locking  catching and is been able to bend her knee.  She denies any knee swelling.  It does hurt with pivoting activities on the medial side of her right knee.  She is asymptomatic with her left knee.  Prior to this injury a month ago she denies any kind of issues with her right knee at all.  HPI  Review of Systems She currently denies any headache, chest pain, shortness of breath, fever, chills, nausea, vomiting  Objective: Vital Signs: There were no vitals taken for this visit.  Physical Exam She is alert and oriented x3 and in no acute distress Ortho Exam Examination of both knees shows no effusion of either knee.  Her right painful knee has full range of motion.  There is no significant patellofemoral crepitation.  There is no instability with varus or valgus stressing and no instability with assessing her Lockman's exam.  She has tenderness along the medial collateral ligament. Specialty Comments:  No specialty comments available.  Imaging: Xr Knee 1-2 Views Right  Result Date: 08/24/2018 An AP and lateral of the right knee shows a medial compartment partial knee arthroplasty with no complicating features.  There is no evidence of loosening.  There is no joint effusion.  The lateral  compartment is well-maintained.  There is slight patellofemoral arthritic changes.    PMFS History: Patient Active Problem List   Diagnosis Date Noted  . COUGH 05/17/2008   Past Medical History:  Diagnosis Date  . Arthritis   . Chronic bronchitis ([place])   . Depression   . GERD (gastroesophageal reflux disease)   . Thyroid disease   . Wears hearing aid    both ears    No family history on file.  Past Surgical History:  Procedure Laterality Date  . APPENDECTOMY    . CHOLECYSTECTOMY  1982  . DILATION AND CURETTAGE OF UTERUS  2003   fibroids  . ECTOPIC PREGNANCY SURGERY  1979x2   on rt  . [place]   x2 left  . EXPLORATORY LAPAROTOMY  8/77   for fertility  . HERNIA REPAIR   1995   lt LA  . KNEE ARTHROSCOPY  2008   rightx2  . KNEE ARTHROSCOPY Left 07/12/2012   Procedure: LEFT KNEE ARTHROSCOPY ;  Surgeon: Hessie Dibble, MD;  Location: [facility];  Service: Orthopedics;  Laterality: Left;  Left knee arthroscopy with partial medial menisectomy and chondroplasty  . LAPAROSCOPIC ABDOMINAL EXPLORATION  1978   repair fall tube scarring fertility  . PARTIAL KNEE ARTHROPLASTY  2010   right  . SHOULDER ARTHROSCOPY  2005   left   Social History   Occupational History  . Not on file  Tobacco Use  . Smoking status: Never Smoker  . Smokeless tobacco: Never Used  Substance and Sexual Activity  . Alcohol use: Yes    Comment: occ  . Drug use: No  . Sexual activity: Not on file

## 2018-08-29 DIAGNOSIS — M13871 Other specified arthritis, right ankle and foot: Secondary | ICD-10-CM | POA: Diagnosis not present

## 2018-08-29 DIAGNOSIS — M2021 Hallux rigidus, right foot: Secondary | ICD-10-CM | POA: Diagnosis not present

## 2018-08-29 DIAGNOSIS — L03031 Cellulitis of right toe: Secondary | ICD-10-CM | POA: Diagnosis not present

## 2018-08-29 DIAGNOSIS — L02611 Cutaneous abscess of right foot: Secondary | ICD-10-CM | POA: Diagnosis not present

## 2018-08-29 DIAGNOSIS — M2012 Hallux valgus (acquired), left foot: Secondary | ICD-10-CM | POA: Diagnosis not present

## 2018-09-12 DIAGNOSIS — J029 Acute pharyngitis, unspecified: Secondary | ICD-10-CM | POA: Diagnosis not present

## 2018-09-23 DIAGNOSIS — F411 Generalized anxiety disorder: Secondary | ICD-10-CM | POA: Diagnosis not present

## 2018-09-23 DIAGNOSIS — R7989 Other specified abnormal findings of blood chemistry: Secondary | ICD-10-CM | POA: Diagnosis not present

## 2018-09-23 DIAGNOSIS — D242 Benign neoplasm of left breast: Secondary | ICD-10-CM | POA: Diagnosis not present

## 2018-09-23 DIAGNOSIS — Z8262 Family history of osteoporosis: Secondary | ICD-10-CM | POA: Diagnosis not present

## 2018-09-23 DIAGNOSIS — R29890 Loss of height: Secondary | ICD-10-CM | POA: Diagnosis not present

## 2018-09-23 DIAGNOSIS — Z96653 Presence of artificial knee joint, bilateral: Secondary | ICD-10-CM | POA: Diagnosis not present

## 2018-09-23 DIAGNOSIS — Z780 Asymptomatic menopausal state: Secondary | ICD-10-CM | POA: Diagnosis not present

## 2018-09-23 DIAGNOSIS — M8589 Other specified disorders of bone density and structure, multiple sites: Secondary | ICD-10-CM | POA: Diagnosis not present

## 2018-09-23 DIAGNOSIS — E039 Hypothyroidism, unspecified: Secondary | ICD-10-CM | POA: Diagnosis not present

## 2018-09-29 DIAGNOSIS — Z20828 Contact with and (suspected) exposure to other viral communicable diseases: Secondary | ICD-10-CM | POA: Diagnosis not present

## 2018-10-03 DIAGNOSIS — E785 Hyperlipidemia, unspecified: Secondary | ICD-10-CM | POA: Diagnosis not present

## 2018-10-03 DIAGNOSIS — E039 Hypothyroidism, unspecified: Secondary | ICD-10-CM | POA: Diagnosis not present

## 2018-10-03 DIAGNOSIS — R7309 Other abnormal glucose: Secondary | ICD-10-CM | POA: Diagnosis not present

## 2018-10-03 DIAGNOSIS — E559 Vitamin D deficiency, unspecified: Secondary | ICD-10-CM | POA: Diagnosis not present

## 2018-10-03 DIAGNOSIS — Z0000 Encounter for general adult medical examination without abnormal findings: Secondary | ICD-10-CM | POA: Diagnosis not present

## 2018-10-03 DIAGNOSIS — R932 Abnormal findings on diagnostic imaging of liver and biliary tract: Secondary | ICD-10-CM | POA: Diagnosis not present

## 2018-10-17 DIAGNOSIS — M25552 Pain in left hip: Secondary | ICD-10-CM | POA: Diagnosis not present

## 2018-12-23 DIAGNOSIS — Z1231 Encounter for screening mammogram for malignant neoplasm of breast: Secondary | ICD-10-CM | POA: Diagnosis not present

## 2018-12-30 DIAGNOSIS — H43813 Vitreous degeneration, bilateral: Secondary | ICD-10-CM | POA: Diagnosis not present

## 2018-12-30 DIAGNOSIS — H40003 Preglaucoma, unspecified, bilateral: Secondary | ICD-10-CM | POA: Diagnosis not present

## 2018-12-30 DIAGNOSIS — H26493 Other secondary cataract, bilateral: Secondary | ICD-10-CM | POA: Diagnosis not present

## 2018-12-30 DIAGNOSIS — H02831 Dermatochalasis of right upper eyelid: Secondary | ICD-10-CM | POA: Diagnosis not present

## 2018-12-30 DIAGNOSIS — H02834 Dermatochalasis of left upper eyelid: Secondary | ICD-10-CM | POA: Diagnosis not present

## 2018-12-30 DIAGNOSIS — H527 Unspecified disorder of refraction: Secondary | ICD-10-CM | POA: Diagnosis not present

## 2019-03-31 DIAGNOSIS — R7309 Other abnormal glucose: Secondary | ICD-10-CM | POA: Diagnosis not present

## 2019-03-31 DIAGNOSIS — Z0000 Encounter for general adult medical examination without abnormal findings: Secondary | ICD-10-CM | POA: Diagnosis not present

## 2019-03-31 DIAGNOSIS — G4700 Insomnia, unspecified: Secondary | ICD-10-CM | POA: Diagnosis not present

## 2019-03-31 DIAGNOSIS — Z1211 Encounter for screening for malignant neoplasm of colon: Secondary | ICD-10-CM | POA: Diagnosis not present

## 2019-03-31 DIAGNOSIS — E785 Hyperlipidemia, unspecified: Secondary | ICD-10-CM | POA: Diagnosis not present

## 2019-03-31 DIAGNOSIS — E039 Hypothyroidism, unspecified: Secondary | ICD-10-CM | POA: Diagnosis not present

## 2019-04-21 DIAGNOSIS — E785 Hyperlipidemia, unspecified: Secondary | ICD-10-CM | POA: Diagnosis not present

## 2019-04-21 DIAGNOSIS — R7309 Other abnormal glucose: Secondary | ICD-10-CM | POA: Diagnosis not present

## 2019-04-21 DIAGNOSIS — Z0000 Encounter for general adult medical examination without abnormal findings: Secondary | ICD-10-CM | POA: Diagnosis not present

## 2019-04-21 DIAGNOSIS — Z23 Encounter for immunization: Secondary | ICD-10-CM | POA: Diagnosis not present

## 2019-04-21 DIAGNOSIS — E039 Hypothyroidism, unspecified: Secondary | ICD-10-CM | POA: Diagnosis not present

## 2019-05-05 DIAGNOSIS — R7989 Other specified abnormal findings of blood chemistry: Secondary | ICD-10-CM | POA: Diagnosis not present

## 2019-05-05 DIAGNOSIS — Z0000 Encounter for general adult medical examination without abnormal findings: Secondary | ICD-10-CM | POA: Diagnosis not present

## 2019-05-05 DIAGNOSIS — R932 Abnormal findings on diagnostic imaging of liver and biliary tract: Secondary | ICD-10-CM | POA: Diagnosis not present

## 2019-05-05 DIAGNOSIS — Z1211 Encounter for screening for malignant neoplasm of colon: Secondary | ICD-10-CM | POA: Diagnosis not present

## 2019-06-07 DIAGNOSIS — Z20828 Contact with and (suspected) exposure to other viral communicable diseases: Secondary | ICD-10-CM | POA: Diagnosis not present

## 2019-07-14 DIAGNOSIS — M2021 Hallux rigidus, right foot: Secondary | ICD-10-CM | POA: Diagnosis not present

## 2019-07-14 DIAGNOSIS — M65872 Other synovitis and tenosynovitis, left ankle and foot: Secondary | ICD-10-CM | POA: Diagnosis not present

## 2019-07-14 DIAGNOSIS — M13871 Other specified arthritis, right ankle and foot: Secondary | ICD-10-CM | POA: Diagnosis not present

## 2019-07-14 DIAGNOSIS — M2012 Hallux valgus (acquired), left foot: Secondary | ICD-10-CM | POA: Diagnosis not present

## 2019-07-25 DIAGNOSIS — H527 Unspecified disorder of refraction: Secondary | ICD-10-CM | POA: Diagnosis not present

## 2019-07-25 DIAGNOSIS — H40003 Preglaucoma, unspecified, bilateral: Secondary | ICD-10-CM | POA: Diagnosis not present

## 2019-08-22 DIAGNOSIS — M2012 Hallux valgus (acquired), left foot: Secondary | ICD-10-CM | POA: Diagnosis not present

## 2019-08-22 DIAGNOSIS — M13871 Other specified arthritis, right ankle and foot: Secondary | ICD-10-CM | POA: Diagnosis not present

## 2019-08-22 DIAGNOSIS — M65872 Other synovitis and tenosynovitis, left ankle and foot: Secondary | ICD-10-CM | POA: Diagnosis not present

## 2019-08-22 DIAGNOSIS — M2021 Hallux rigidus, right foot: Secondary | ICD-10-CM | POA: Diagnosis not present

## 2019-10-10 IMAGING — MR MRI ABDOMEN WITH AND WITHOUT CONTRAST
17 series · 48 of 48 positions shown · IV contrast (Multihance 17ml)
Comparison: Ultrasound on 02/24/2018 and CT on 02/06/2014

CLINICAL DATA: Elevated liver function tests. Diffuse
hepatocellular disease on recent ultrasound.

Creatinine was obtained on site at [HOSPITAL] at [HOSPITAL].
Results: Creatinine 0.7 mg/dL.
EXAM:
MRI ABDOMEN WITHOUT AND WITH CONTRAST
TECHNIQUE: Multiplanar multisequence MR imaging of the abdomen was performed
both before and after the administration of intravenous contrast.
CONTRAST:  17mL MULTIHANCE GADOBENATE DIMEGLUMINE 529 MG/ML IV SOLN

[Series 2: T2 · coronal · 5.0mm · 1.56mm/px · 1 of 36 slices shown (1 of 3)]
[im 1/36]
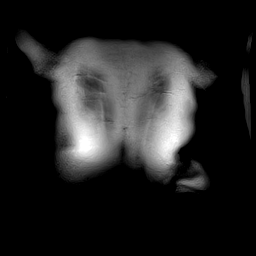

[Series 3: T2 · axial · 6.0mm · 1.19mm/px · 1 of 36 slices shown (2 of 3)]
[im 1/36]
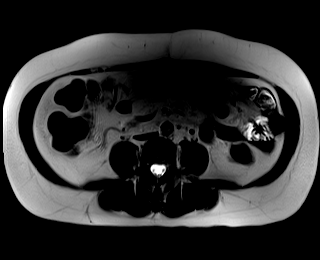

[Series 4: T1 · axial · 3.0mm · 1.19mm/px · z∈[-102,+159]mm · 5 of 176 slices shown]
[im 1/176]
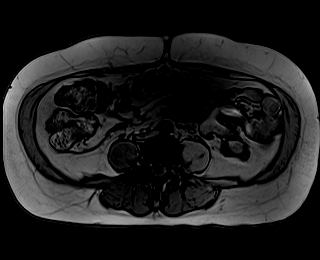
[im 44/176]
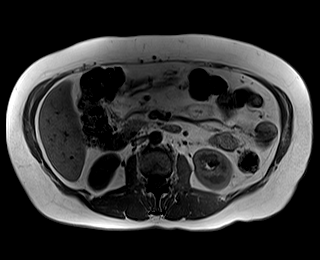
[im 88/176]
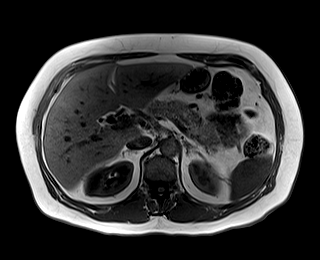
[im 132/176]
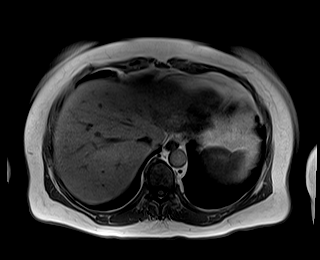
[im 176/176]
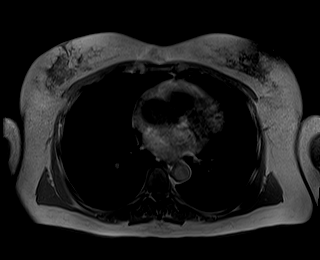

[Series 5: bSSFP · axial · 5.0mm · 1.25mm/px · z∈[-97,+155]mm · 2 of 43 slices shown]
[im 1/43]
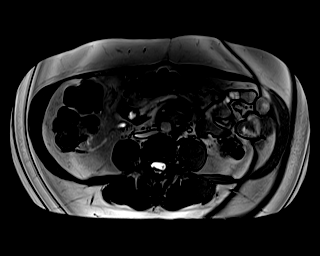
[im 43/43]
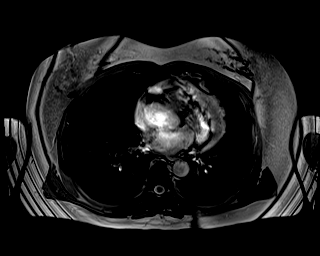

[Series 6: T2 · axial · 5.0mm · 1.56mm/px · z∈[-97,+155]mm · 2 of 43 slices shown (3 of 3)]
[im 1/43]
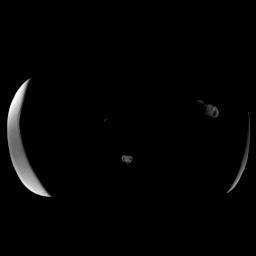
[im 43/43]
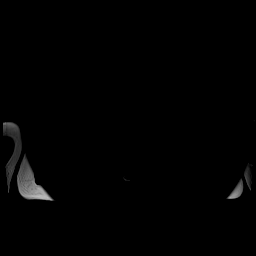

[Series 7: DWI · axial · 5.0mm · 1.49mm/px · z∈[-97,+155]mm · 5 of 129 slices shown (1 of 2)]
[im 1/129]
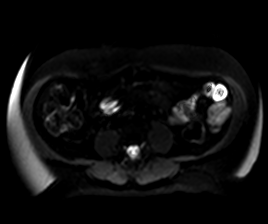
[im 33/129]
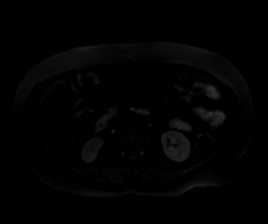
[im 65/129]
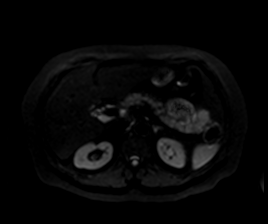
[im 97/129]
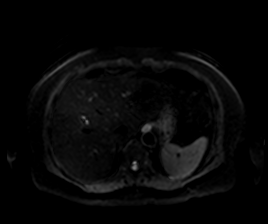
[im 129/129]
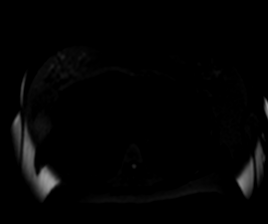

[Series 8: DWI · axial · 5.0mm · 1.49mm/px · z∈[-97,+155]mm · 2 of 43 slices shown (2 of 2)]
[im 1/43]
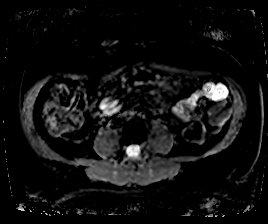
[im 43/43]
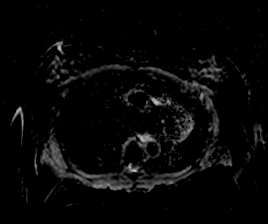

[Series 9: T1 dynamic · axial · non-contrast · 3.0mm · 1.25mm/px · z∈[-102,+159]mm · 3 of 88 slices shown]
[im 1/88]
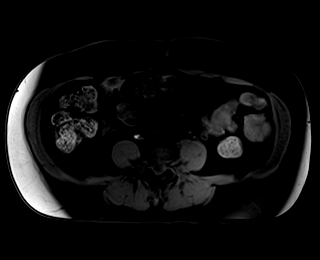
[im 44/88]
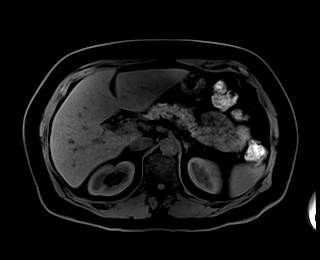
[im 88/88]
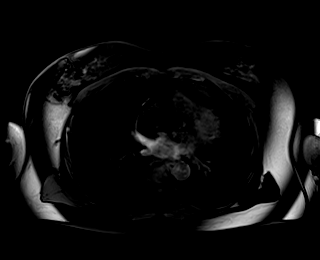

[Series 10: T1 dynamic post-contrast · axial · 3.0mm · 1.25mm/px · z∈[-102,+159]mm · 3 of 88 slices shown (1 of 9)]
[im 1/88]
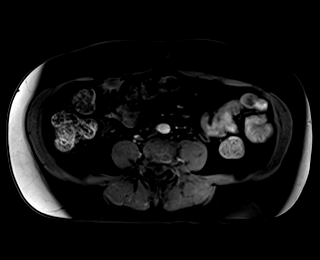
[im 44/88]
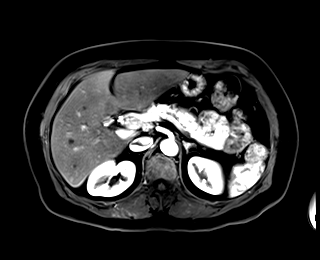
[im 88/88]
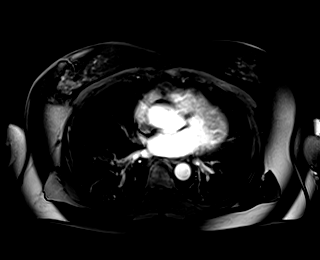

[Series 11: T1 dynamic post-contrast · axial · 3.0mm · 1.25mm/px · z∈[-102,+159]mm · 3 of 88 slices shown (2 of 9)]
[im 1/88]
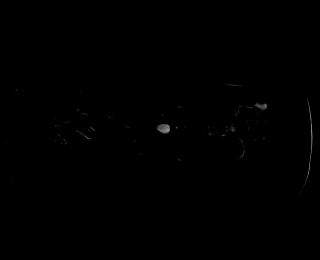
[im 44/88]
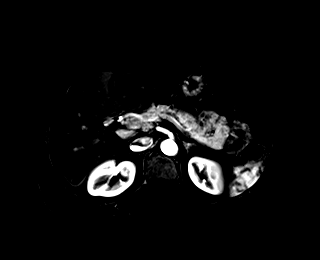
[im 88/88]
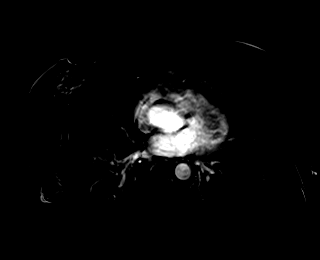

[Series 12: T1 dynamic post-contrast · axial · 3.0mm · 1.25mm/px · z∈[-102,+159]mm · 3 of 88 slices shown (3 of 9)]
[im 1/88]
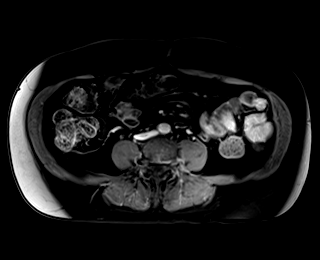
[im 44/88]
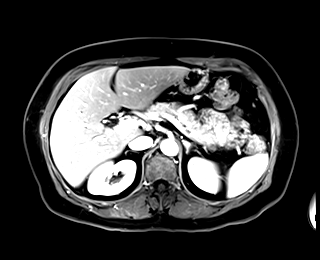
[im 88/88]
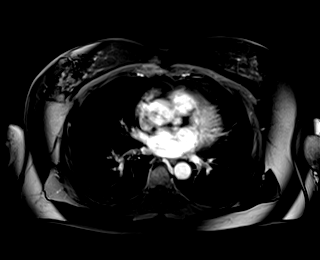

[Series 13: T1 dynamic post-contrast · axial · 3.0mm · 1.25mm/px · z∈[-102,+159]mm · 3 of 88 slices shown (4 of 9)]
[im 1/88]
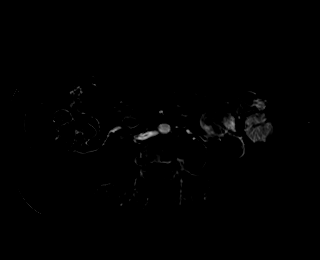
[im 44/88]
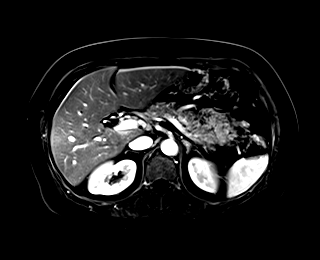
[im 88/88]
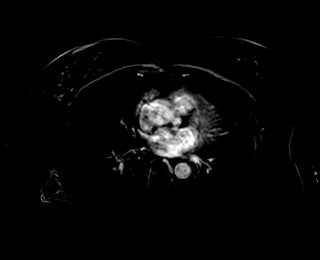

[Series 14: T1 dynamic post-contrast · axial · 3.0mm · 1.25mm/px · z∈[-102,+159]mm · 3 of 88 slices shown (5 of 9)]
[im 1/88]
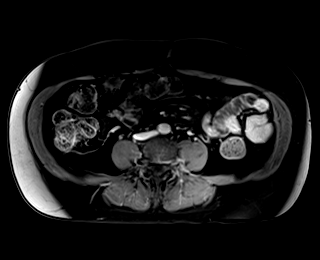
[im 44/88]
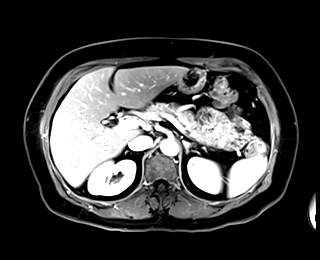
[im 88/88]
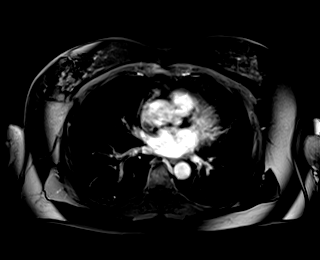

[Series 15: T1 dynamic post-contrast · axial · 3.0mm · 1.25mm/px · z∈[-102,+159]mm · 3 of 88 slices shown (6 of 9)]
[im 1/88]
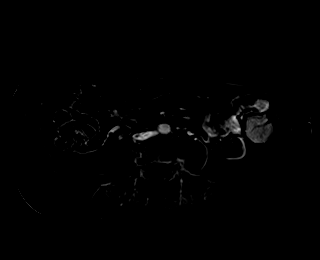
[im 44/88]
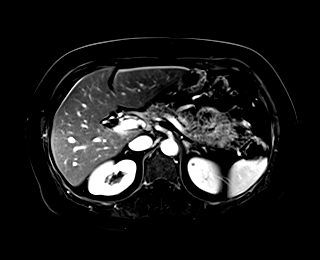
[im 88/88]
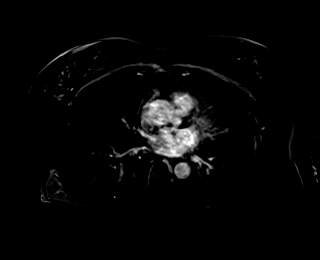

[Series 16: T1 dynamic post-contrast · coronal · 3.0mm · 1.25mm/px · 3 of 72 slices shown (7 of 9)]
[im 1/72]
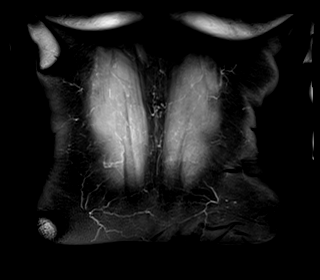
[im 36/72]
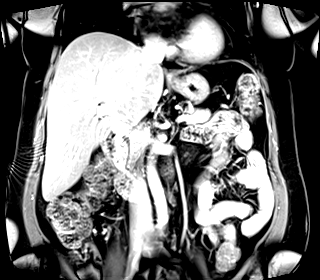
[im 72/72]
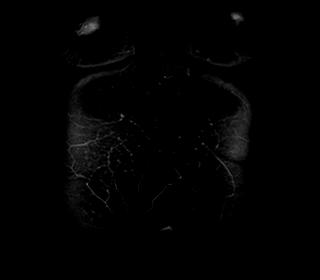

[Series 17: T1 dynamic post-contrast · axial · 3.0mm · 1.25mm/px · z∈[-102,+159]mm · 3 of 88 slices shown (8 of 9)]
[im 1/88]
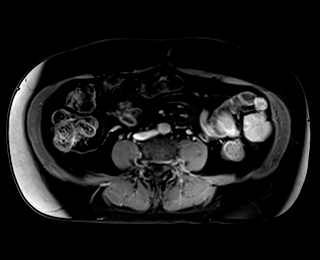
[im 44/88]
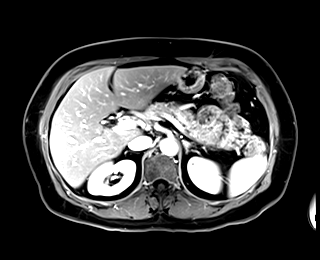
[im 88/88]
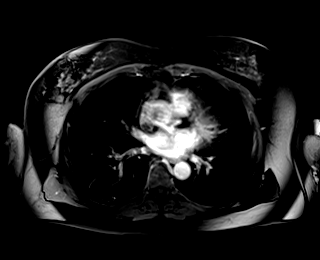

[Series 18: T1 dynamic post-contrast · axial · 3.0mm · 1.25mm/px · z∈[-102,+159]mm · 3 of 88 slices shown (9 of 9)]
[im 1/88]
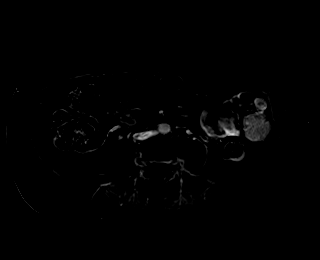
[im 44/88]
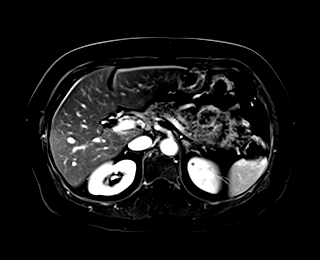
[im 88/88]
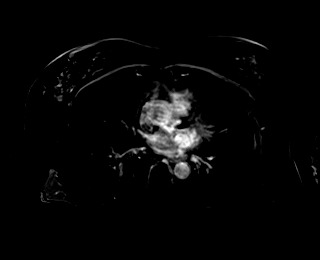

[48 of 48 positions shown; findings below may reference images not displayed]

FINDINGS: Lower chest: No acute findings.

Hepatobiliary: Mild hepatomegaly and mild-to-moderate diffuse
hepatic steatosis are demonstrated, similar to previous CT. No gross
morphologic changes of hepatic cirrhosis are seen. Prior
cholecystectomy. No evidence of biliary obstruction.

Pancreas:  No mass or inflammatory changes.

Spleen:  Within normal limits in size and appearance.

Adrenals/Urinary Tract: No masses identified. No evidence of
hydronephrosis.

Stomach/Bowel: Visualized portion unremarkable.

Vascular/Lymphatic: No pathologically enlarged lymph nodes
identified. No abdominal aortic aneurysm.

Other:  None.

Musculoskeletal:  No suspicious bone lesions identified.
IMPRESSION: Mild hepatomegaly and mild-to-moderate hepatic steatosis, without
significant change compared to prior CT.

No radiographic findings of cirrhosis, hepatic neoplasm, or other
significant abnormality.

## 2019-10-23 DIAGNOSIS — R112 Nausea with vomiting, unspecified: Secondary | ICD-10-CM | POA: Diagnosis not present

## 2019-10-23 DIAGNOSIS — K581 Irritable bowel syndrome with constipation: Secondary | ICD-10-CM | POA: Diagnosis not present

## 2019-10-23 DIAGNOSIS — R109 Unspecified abdominal pain: Secondary | ICD-10-CM | POA: Insufficient documentation

## 2019-11-07 DIAGNOSIS — M79644 Pain in right finger(s): Secondary | ICD-10-CM | POA: Diagnosis not present

## 2019-11-24 DIAGNOSIS — F411 Generalized anxiety disorder: Secondary | ICD-10-CM | POA: Diagnosis not present

## 2019-11-24 DIAGNOSIS — E785 Hyperlipidemia, unspecified: Secondary | ICD-10-CM | POA: Diagnosis not present

## 2019-11-24 DIAGNOSIS — E039 Hypothyroidism, unspecified: Secondary | ICD-10-CM | POA: Diagnosis not present

## 2019-11-24 DIAGNOSIS — R1030 Lower abdominal pain, unspecified: Secondary | ICD-10-CM | POA: Diagnosis not present

## 2019-12-01 ENCOUNTER — Other Ambulatory Visit: Payer: Self-pay | Admitting: Family Medicine

## 2019-12-01 DIAGNOSIS — R1030 Lower abdominal pain, unspecified: Secondary | ICD-10-CM

## 2019-12-08 ENCOUNTER — Ambulatory Visit
Admission: RE | Admit: 2019-12-08 | Discharge: 2019-12-08 | Disposition: A | Discharge status: Home / Self Care | Payer: PPO | Source: Ambulatory Visit | Attending: Family Medicine | Admitting: Family Medicine

## 2019-12-08 DIAGNOSIS — R1032 Left lower quadrant pain: Secondary | ICD-10-CM | POA: Diagnosis not present

## 2019-12-08 DIAGNOSIS — R1030 Lower abdominal pain, unspecified: Secondary | ICD-10-CM

## 2019-12-17 DIAGNOSIS — R519 Headache, unspecified: Secondary | ICD-10-CM | POA: Diagnosis not present

## 2019-12-17 DIAGNOSIS — Z20822 Contact with and (suspected) exposure to covid-19: Secondary | ICD-10-CM | POA: Diagnosis not present

## 2020-01-05 DIAGNOSIS — Z1231 Encounter for screening mammogram for malignant neoplasm of breast: Secondary | ICD-10-CM | POA: Diagnosis not present

## 2020-02-21 ENCOUNTER — Other Ambulatory Visit: Payer: Self-pay

## 2020-02-21 ENCOUNTER — Emergency Department (HOSPITAL_BASED_OUTPATIENT_CLINIC_OR_DEPARTMENT_OTHER): Payer: PPO

## 2020-02-21 ENCOUNTER — Encounter (HOSPITAL_BASED_OUTPATIENT_CLINIC_OR_DEPARTMENT_OTHER): Payer: Self-pay | Admitting: *Deleted

## 2020-02-21 ENCOUNTER — Emergency Department (HOSPITAL_BASED_OUTPATIENT_CLINIC_OR_DEPARTMENT_OTHER)
Admission: EM | Admit: 2020-02-21 | Discharge: 2020-02-21 | Disposition: A | Discharge status: Home / Self Care | Payer: PPO | Attending: Emergency Medicine | Admitting: Emergency Medicine

## 2020-02-21 DIAGNOSIS — Z86010 Personal history of colonic polyps: Secondary | ICD-10-CM | POA: Diagnosis not present

## 2020-02-21 DIAGNOSIS — K529 Noninfective gastroenteritis and colitis, unspecified: Secondary | ICD-10-CM | POA: Diagnosis not present

## 2020-02-21 DIAGNOSIS — K644 Residual hemorrhoidal skin tags: Secondary | ICD-10-CM | POA: Diagnosis not present

## 2020-02-21 DIAGNOSIS — Z79899 Other long term (current) drug therapy: Secondary | ICD-10-CM | POA: Insufficient documentation

## 2020-02-21 DIAGNOSIS — D122 Benign neoplasm of ascending colon: Secondary | ICD-10-CM | POA: Diagnosis not present

## 2020-02-21 DIAGNOSIS — R000 Tachycardia, unspecified: Secondary | ICD-10-CM | POA: Insufficient documentation

## 2020-02-21 DIAGNOSIS — K449 Diaphragmatic hernia without obstruction or gangrene: Secondary | ICD-10-CM | POA: Diagnosis not present

## 2020-02-21 DIAGNOSIS — K2970 Gastritis, unspecified, without bleeding: Secondary | ICD-10-CM | POA: Diagnosis not present

## 2020-02-21 DIAGNOSIS — K208 Other esophagitis without bleeding: Secondary | ICD-10-CM | POA: Diagnosis not present

## 2020-02-21 DIAGNOSIS — K2100 Gastro-esophageal reflux disease with esophagitis, without bleeding: Secondary | ICD-10-CM | POA: Diagnosis not present

## 2020-02-21 DIAGNOSIS — R112 Nausea with vomiting, unspecified: Secondary | ICD-10-CM | POA: Diagnosis not present

## 2020-02-21 DIAGNOSIS — J181 Lobar pneumonia, unspecified organism: Secondary | ICD-10-CM | POA: Diagnosis not present

## 2020-02-21 DIAGNOSIS — K635 Polyp of colon: Secondary | ICD-10-CM | POA: Diagnosis not present

## 2020-02-21 DIAGNOSIS — K5730 Diverticulosis of large intestine without perforation or abscess without bleeding: Secondary | ICD-10-CM | POA: Diagnosis not present

## 2020-02-21 DIAGNOSIS — Z1211 Encounter for screening for malignant neoplasm of colon: Secondary | ICD-10-CM | POA: Diagnosis not present

## 2020-02-21 DIAGNOSIS — R1110 Vomiting, unspecified: Secondary | ICD-10-CM | POA: Diagnosis present

## 2020-02-21 DIAGNOSIS — K2950 Unspecified chronic gastritis without bleeding: Secondary | ICD-10-CM | POA: Diagnosis not present

## 2020-02-21 DIAGNOSIS — J690 Pneumonitis due to inhalation of food and vomit: Secondary | ICD-10-CM | POA: Insufficient documentation

## 2020-02-21 DIAGNOSIS — K648 Other hemorrhoids: Secondary | ICD-10-CM | POA: Diagnosis not present

## 2020-02-21 DIAGNOSIS — Z96651 Presence of right artificial knee joint: Secondary | ICD-10-CM | POA: Insufficient documentation

## 2020-02-21 DIAGNOSIS — D123 Benign neoplasm of transverse colon: Secondary | ICD-10-CM | POA: Diagnosis not present

## 2020-02-21 DIAGNOSIS — D125 Benign neoplasm of sigmoid colon: Secondary | ICD-10-CM | POA: Diagnosis not present

## 2020-02-21 DIAGNOSIS — K317 Polyp of stomach and duodenum: Secondary | ICD-10-CM | POA: Diagnosis not present

## 2020-02-21 DIAGNOSIS — D120 Benign neoplasm of cecum: Secondary | ICD-10-CM | POA: Diagnosis not present

## 2020-02-21 DIAGNOSIS — Z8719 Personal history of other diseases of the digestive system: Secondary | ICD-10-CM | POA: Diagnosis not present

## 2020-02-21 DIAGNOSIS — K621 Rectal polyp: Secondary | ICD-10-CM | POA: Diagnosis not present

## 2020-02-21 LAB — CBC WITH DIFFERENTIAL/PLATELET
Abs Immature Granulocytes: 0.14 10*3/uL — ABNORMAL HIGH (ref 0.00–0.07)
Basophils Absolute: 0.1 10*3/uL (ref 0.0–0.1)
Basophils Relative: 0 %
Eosinophils Absolute: 0 10*3/uL (ref 0.0–0.5)
Eosinophils Relative: 0 %
HCT: 44.7 % (ref 36.0–46.0)
Hemoglobin: 15.1 g/dL — ABNORMAL HIGH (ref 12.0–15.0)
Immature Granulocytes: 1 %
Lymphocytes Relative: 5 %
Lymphs Abs: 1.1 10*3/uL (ref 0.7–4.0)
MCH: 29.6 pg (ref 26.0–34.0)
MCHC: 33.8 g/dL (ref 30.0–36.0)
MCV: 87.6 fL (ref 80.0–100.0)
Monocytes Absolute: 1.4 10*3/uL — ABNORMAL HIGH (ref 0.1–1.0)
Monocytes Relative: 7 %
Neutro Abs: 18.2 10*3/uL — ABNORMAL HIGH (ref 1.7–7.7)
Neutrophils Relative %: 87 %
Platelets: 300 10*3/uL (ref 150–400)
RBC: 5.1 MIL/uL (ref 3.87–5.11)
RDW: 13.2 % (ref 11.5–15.5)
WBC: 20.8 10*3/uL — ABNORMAL HIGH (ref 4.0–10.5)
nRBC: 0 % (ref 0.0–0.2)

## 2020-02-21 LAB — COMPREHENSIVE METABOLIC PANEL
ALT: 49 U/L — ABNORMAL HIGH (ref 0–44)
AST: 37 U/L (ref 15–41)
Albumin: 3.9 g/dL (ref 3.5–5.0)
Alkaline Phosphatase: 47 U/L (ref 38–126)
Anion gap: 10 (ref 5–15)
BUN: 12 mg/dL (ref 8–23)
CO2: 26 mmol/L (ref 22–32)
Calcium: 8.7 mg/dL — ABNORMAL LOW (ref 8.9–10.3)
Chloride: 102 mmol/L (ref 98–111)
Creatinine, Ser: 0.78 mg/dL (ref 0.44–1.00)
GFR, Estimated: >60 mL/min (ref >60)
Glucose, Bld: 118 mg/dL — ABNORMAL HIGH (ref 70–99)
Potassium: 3.6 mmol/L (ref 3.5–5.1)
Sodium: 138 mmol/L (ref 135–145)
Total Bilirubin: 0.7 mg/dL (ref 0.3–1.2)
Total Protein: 6.8 g/dL (ref 6.5–8.1)

## 2020-02-21 LAB — LACTIC ACID, PLASMA: Lactic Acid, Venous: 1.3 mmol/L (ref 0.5–1.9)

## 2020-02-21 LAB — RAPID URINE DRUG SCREEN, HOSP PERFORMED
Amphetamines: NOT DETECTED
Barbiturates: NOT DETECTED
Benzodiazepines: NOT DETECTED
Cocaine: NOT DETECTED
Opiates: NOT DETECTED
Tetrahydrocannabinol: NOT DETECTED

## 2020-02-21 LAB — URINALYSIS, ROUTINE W REFLEX MICROSCOPIC
Bilirubin Urine: NEGATIVE
Glucose, UA: NEGATIVE mg/dL
Hgb urine dipstick: NEGATIVE
Ketones, ur: NEGATIVE mg/dL
Leukocytes,Ua: NEGATIVE
Nitrite: NEGATIVE
Protein, ur: NEGATIVE mg/dL
Specific Gravity, Urine: 1.015 (ref 1.005–1.030)
pH: 6 (ref 5.0–8.0)

## 2020-02-21 LAB — ETHANOL: Alcohol, Ethyl (B): <10 mg/dL (ref <10)

## 2020-02-21 MED ORDER — AMOXICILLIN-POT CLAVULANATE 875-125 MG PO TABS: 1.0000 | ORAL_TABLET | Freq: Two times a day (BID) | ORAL | 0 refills | Status: DC

## 2020-02-21 MED ORDER — SODIUM CHLORIDE 0.9 % IV SOLN
500.0000 mg | INTRAVENOUS | Status: DC
  Administered 2020-02-21: 500 mg via INTRAVENOUS
  Filled 2020-02-21: qty 500

## 2020-02-21 MED ORDER — SODIUM CHLORIDE 0.9 % IV BOLUS
1000.0000 mL | Freq: Once | INTRAVENOUS | Status: AC
  Administered 2020-02-21: 1000 mL via INTRAVENOUS

## 2020-02-21 MED ORDER — SODIUM CHLORIDE 0.9 % IV SOLN
2.0000 g | INTRAVENOUS | Status: DC
  Administered 2020-02-21: 2 g via INTRAVENOUS
  Filled 2020-02-21: qty 20

## 2020-02-21 NOTE — ED Notes (Signed)
Pt in XRAY unable to start IV/medications at this time.

## 2020-02-21 NOTE — ED Notes (Signed)
Went into [facility] to obtain EKG and PT was in XR, Will place on monitor and obtain 12 lead immediatly upon return.

## 2020-02-21 NOTE — ED Notes (Signed)
Pt aware urine collection needed.

## 2020-02-21 NOTE — ED Triage Notes (Signed)
Pt reports colonoscopy this am , reports confusion after nap today

## 2020-02-21 NOTE — ED Provider Notes (Signed)
[facility] EMERGENCY DEPARTMENT Provider Note  CSN: [id number] Arrival date & time: 02/21/20 1749    History Chief Complaint  Patient presents with  . Altered Mental Status    HPI  Amy Glenn is a 69 y.o. female who reports she had a routine screening colonoscopy as well as an EGD for vomiting this morning, got home around 2pm per son. Patient reports she went home drank some water and then went to bed to take a nap. At around 5pm she woke up with chills and felt like her legs were very weak when she tried to get up to get an extra blanket. She was unable to stand or walk. She vomited several times then and was eventually able to get herself back into bed at which point she called her son who reports she seemed very confused on the phone, didn't know what day it was, thought she had been sleeping all night, etc. She has since regained much of her strength and seems less confused. She state she vomits often, including during her procedure, related to IBS.    Past Medical History:  Diagnosis Date  . Arthritis   . Chronic bronchitis ([place])   . Depression   . GERD (gastroesophageal reflux disease)   . Thyroid disease   . Wears hearing aid    both ears    Past Surgical History:  Procedure Laterality Date  . APPENDECTOMY    . CHOLECYSTECTOMY  1982  . DILATION AND CURETTAGE OF UTERUS  2003   fibroids  . ECTOPIC PREGNANCY SURGERY  1979x2   on rt  . [place]   x2 left  . EXPLORATORY LAPAROTOMY  8/77   for fertility  . HERNIA REPAIR  1995   lt LA  . KNEE ARTHROSCOPY  2008   rightx2  . KNEE ARTHROSCOPY Left 07/12/2012   Procedure: LEFT KNEE ARTHROSCOPY ;  Surgeon: Hessie Dibble, MD;  Location: [facility];  Service: Orthopedics;  Laterality: Left;  Left knee arthroscopy with partial medial menisectomy and chondroplasty  . LAPAROSCOPIC ABDOMINAL EXPLORATION  1978   repair fall tube scarring fertility  . PARTIAL KNEE  ARTHROPLASTY  2010   right  . SHOULDER ARTHROSCOPY  2005   left    No family history on file.  Social History   Tobacco Use  . Smoking status: Never Smoker  . Smokeless tobacco: Never Used  Vaping Use  . Vaping Use: Never used  Substance Use Topics  . Alcohol use: Yes    Comment: occ  . Drug use: No     Home Medications Prior to Admission medications   Medication Sig Start Date End Date Taking? Authorizing Provider  albuterol (PROVENTIL HFA;VENTOLIN HFA) 108 (90 BASE) MCG/ACT inhaler Inhale 2 puffs into the lungs every 6 (six) hours as needed for wheezing.    [provider]  amitriptyline (ELAVIL) 10 MG tablet Take 10 mg by mouth at bedtime.    [provider]  amoxicillin-clavulanate (AUGMENTIN) 875-125 MG tablet Take 1 tablet by mouth 2 (two) times daily. 02/21/20   Truddie Hidden, MD  dorzolamide-timolol (COSOPT) 22.3-6.8 MG/ML ophthalmic solution 1 drop 2 (two) times daily. 01/11/20   [provider]  EUTHYROX 50 MCG tablet Take 50 mcg by mouth every morning. 11/25/19   [provider]  fish oil-omega-3 fatty acids 1000 MG capsule Take 2 g by mouth daily.    [provider]  lansoprazole (PREVACID) 15 MG  capsule Take by mouth.    [provider]  levothyroxine (SYNTHROID, LEVOTHROID) 75 MCG tablet Take 75 mcg daily before breakfast by mouth.    [provider]  meloxicam (MOBIC) 15 MG tablet Take by mouth.    [provider]  Multiple Vitamins-Minerals (MULTIVITAMIN WITH MINERALS) tablet Take 1 tablet by mouth daily.    [provider]  naproxen (NAPROSYN) 500 MG tablet Take 1 tablet (500 mg total) 2 (two) times daily with a meal by mouth. 01/24/17   [name], MD  naproxen sodium (ANAPROX) 220 MG tablet Take 220 mg by mouth 2 (two) times daily with a meal.    [provider]  oxyCODONE-acetaminophen (PERCOCET) 5-325 MG tablet Take 1 tablet every 4 (four) hours as needed by  mouth. Patient not taking: Reported on 08/24/2018 01/24/17   [name], MD  PARoxetine (PAXIL) 20 MG tablet Take 20 mg by mouth every morning.    [provider]  promethazine (PHENERGAN) 25 MG tablet Take 1 tablet (25 mg total) every 6 (six) hours as needed by mouth for nausea or vomiting. Patient not taking: Reported on 08/24/2018 01/24/17   [name], MD  tamsulosin (FLOMAX) 0.4 MG CAPS capsule Take 1 capsule (0.4 mg total) 2 (two) times daily by mouth. Patient not taking: Reported on 08/24/2018 01/24/17   [name], MD  zolpidem (AMBIEN) 5 MG tablet Take by mouth.    [provider]     Allergies    Patient has no known allergies.   Review of Systems   Review of Systems A comprehensive review of systems was completed and negative except as noted in HPI.    Physical Exam BP 107/68 (BP Location: Left Arm)   Pulse (!) 102   Temp 98.3 F (36.8 C) (Oral)   Resp 18   Ht 5\' 7"  (1.702 m)   Wt 81.6 kg   SpO2 93%   BMI 28.19 kg/m   Physical Exam Vitals and nursing note reviewed.  Constitutional:      Appearance: Normal appearance.  HENT:     Head: Normocephalic and atraumatic.     Nose: Nose normal.     Mouth/Throat:     Mouth: Mucous membranes are dry.  Eyes:     Extraocular Movements: Extraocular movements intact.     Conjunctiva/sclera: Conjunctivae normal.  Cardiovascular:     Rate and Rhythm: Tachycardia present.  Pulmonary:     Effort: Pulmonary effort is normal.     Breath sounds: Normal breath sounds.  Abdominal:     General: Abdomen is flat.     Palpations: Abdomen is soft.     Tenderness: There is no abdominal tenderness. There is no guarding.  Musculoskeletal:        General: No swelling. Normal range of motion.     Cervical back: Neck supple.  Skin:    General: Skin is warm and dry.  Neurological:     General: No focal deficit present.     Mental Status: She is alert and oriented to person, place, and time.     Cranial  Nerves: No cranial nerve deficit.     Sensory: No sensory deficit.     Motor: No weakness.  Psychiatric:        Mood and Affect: Mood normal.      ED Results / Procedures / Treatments   Labs (all labs ordered are listed, but only abnormal results are displayed) Labs Reviewed  CBC WITH DIFFERENTIAL/PLATELET - Abnormal;  Notable for the following components:      Result Value   WBC 20.8 (*)    Hemoglobin 15.1 (*)    Neutro Abs 18.2 (*)    Monocytes Absolute 1.4 (*)    Abs Immature Granulocytes 0.14 (*)    All other components within normal limits  COMPREHENSIVE METABOLIC PANEL - Abnormal; Notable for the following components:   Glucose, Bld 118 (*)    Calcium 8.7 (*)    ALT 49 (*)    All other components within normal limits  CULTURE, BLOOD (ROUTINE X 2)  CULTURE, BLOOD (ROUTINE X 2)  URINALYSIS, ROUTINE W REFLEX MICROSCOPIC  RAPID URINE DRUG SCREEN, HOSP PERFORMED  ETHANOL  LACTIC ACID, PLASMA    EKG EKG Interpretation  Date/Time:  Wednesday February 21 2020 18:52:03 EST Ventricular Rate:  111 PR Interval:    QRS Duration: 94 QT Interval:  363 QTC Calculation: 494 R Axis:   46 Text Interpretation: Sinus tachycardia Probable left atrial enlargement RSR' in V1 or V2, right VCD or RVH Nonspecific T abnormalities, anterior leads Borderline prolonged QT interval No old tracing to compare Confirmed by Calvert Cantor [phone]) on 02/21/2020 7:05:02 PM    Radiology DG Chest 2 View  Result Date: 02/21/2020 CLINICAL DATA:  69 year old female with history of shortness of breath. Status post colonoscopy this morning. Patient vomited during the procedure. EXAM: CHEST - 2 VIEW COMPARISON:  Chest x-ray 08/02/2017. FINDINGS: Extensive airspace consolidation in the left upper lobe. Right lung is clear. No pleural effusions. No evidence of pulmonary edema. No pneumothorax. Heart size is normal. Upper mediastinal contours are within normal limits. IMPRESSION: 1. Left upper lobe  pneumonia. Given the history, findings are concerning for aspiration pneumonia. Electronically Signed   By: Vinnie Langton M.D.   On: 02/21/2020 19:11    Procedures Procedures  Medications Ordered in the ED Medications  cefTRIAXone (ROCEPHIN) 2 g in sodium chloride 0.9 % 100 mL IVPB ( Intravenous Stopped 02/21/20 2051)  azithromycin (ZITHROMAX) 500 mg in sodium chloride 0.9 % 250 mL IVPB ( Intravenous Stopped 02/21/20 2159)  sodium chloride 0.9 % bolus 1,000 mL ( Intravenous Stopped 02/21/20 2014)     MDM Rules/Calculators/A&P MDM Patient oriented x 3 with normal neuro exam now. She may have still had some anesthesia making her weak and confused or could be electrolyte abnormality from bowel prep. She does not have a history of drug or alcohol use. No recent opiate or benzo Rx filled. Not febrile in the ED.  ED Course  I have reviewed the triage vital signs and the nursing notes.  Pertinent labs & imaging results that were available during my care of the patient were reviewed by me and considered in my medical decision making (see chart for details).  Clinical Course as of Feb 21 2215  Wed Feb 21, 2020  1911 CBC with moderate leukocytosis. CXR appears to show a L sided infiltrate by my read.    [CS]  1915 Rads agrees with L infiltrate, possible aspiration given her history of vomiting. Will add blood cultures, Covid and lactic acid given chills at home, tachycardia and leukocytosis.    [CS]  1927 CMP is normal.    [CS]  1930 UA normal.   [CS]  2200 Patient is back to baseline mental status now. She would like to go home if possible. Will check ambulatory SpO2 and if she stays adequate oxygenation and feels well plan discharge with Rx for Augmentin and close PCP follow up.    [  CS]  2215 Patient has ambulated without difficulty or hypoxia. Requesting to go home.    [CS]    Clinical Course User Index [CS] Truddie Hidden, MD    Final Clinical Impression(s) / ED  Diagnoses Final diagnoses:  Aspiration pneumonia of left upper lobe due to gastric secretions [facility])    Rx / DC Orders ED Discharge Orders         Ordered    amoxicillin-clavulanate (AUGMENTIN) 875-125 MG tablet  2 times daily        02/21/20 2216           Truddie Hidden, MD 02/21/20 2216

## 2020-02-21 NOTE — ED Notes (Signed)
Patient maintained O2 saturation while ambulating down hallway. O2 93%.

## 2020-02-21 NOTE — ED Notes (Signed)
Pt self ambulated to restroom, pt denies pain at this time.

## 2020-02-21 NOTE — Sepsis Progress Note (Signed)
Following for code sepsis monitoring 

## 2020-02-26 LAB — CULTURE, BLOOD (ROUTINE X 2)
Culture: NO GROWTH
Culture: NO GROWTH
Specimen Description: ADEQUATE
Specimen Description: ADEQUATE

## 2020-02-28 DIAGNOSIS — J189 Pneumonia, unspecified organism: Secondary | ICD-10-CM | POA: Diagnosis not present

## 2020-02-28 DIAGNOSIS — R002 Palpitations: Secondary | ICD-10-CM | POA: Diagnosis not present

## 2020-02-28 DIAGNOSIS — Z23 Encounter for immunization: Secondary | ICD-10-CM | POA: Diagnosis not present

## 2020-03-04 ENCOUNTER — Telehealth (HOSPITAL_COMMUNITY): Payer: Self-pay | Admitting: Cardiology

## 2020-03-04 NOTE — Telephone Encounter (Signed)
Pt called to f/u w/referral  From Dr Marrow, pt can be  Reached @[phone].  Thanks

## 2020-03-04 NOTE — Telephone Encounter (Signed)
Will route to referral nurse and scheduler for further review and or update  

## 2020-03-07 NOTE — Telephone Encounter (Signed)
Ref was for ST and to general cards, does not need to be seen in HF clinic, per ref note [facility] has been calling to get her scheduled

## 2020-03-14 DIAGNOSIS — M778 Other enthesopathies, not elsewhere classified: Secondary | ICD-10-CM | POA: Diagnosis not present

## 2020-03-14 DIAGNOSIS — M65331 Trigger finger, right middle finger: Secondary | ICD-10-CM | POA: Diagnosis not present

## 2020-03-20 ENCOUNTER — Encounter: Payer: Self-pay | Admitting: General Practice

## 2020-04-26 DIAGNOSIS — Z20822 Contact with and (suspected) exposure to covid-19: Secondary | ICD-10-CM | POA: Diagnosis not present

## 2020-06-01 DIAGNOSIS — H66001 Acute suppurative otitis media without spontaneous rupture of ear drum, right ear: Secondary | ICD-10-CM | POA: Diagnosis not present

## 2020-06-13 DIAGNOSIS — E785 Hyperlipidemia, unspecified: Secondary | ICD-10-CM | POA: Diagnosis not present

## 2020-06-13 DIAGNOSIS — Z0000 Encounter for general adult medical examination without abnormal findings: Secondary | ICD-10-CM | POA: Diagnosis not present

## 2020-06-13 DIAGNOSIS — R7309 Other abnormal glucose: Secondary | ICD-10-CM | POA: Diagnosis not present

## 2020-06-13 DIAGNOSIS — E039 Hypothyroidism, unspecified: Secondary | ICD-10-CM | POA: Diagnosis not present

## 2020-06-13 DIAGNOSIS — F411 Generalized anxiety disorder: Secondary | ICD-10-CM | POA: Diagnosis not present

## 2020-06-13 DIAGNOSIS — E559 Vitamin D deficiency, unspecified: Secondary | ICD-10-CM | POA: Diagnosis not present

## 2020-06-13 DIAGNOSIS — Z23 Encounter for immunization: Secondary | ICD-10-CM | POA: Diagnosis not present

## 2020-06-13 DIAGNOSIS — R002 Palpitations: Secondary | ICD-10-CM | POA: Diagnosis not present

## 2020-07-04 DIAGNOSIS — Z0000 Encounter for general adult medical examination without abnormal findings: Secondary | ICD-10-CM | POA: Diagnosis not present

## 2020-07-04 DIAGNOSIS — E039 Hypothyroidism, unspecified: Secondary | ICD-10-CM | POA: Diagnosis not present

## 2020-07-04 DIAGNOSIS — R7309 Other abnormal glucose: Secondary | ICD-10-CM | POA: Diagnosis not present

## 2020-07-04 DIAGNOSIS — E785 Hyperlipidemia, unspecified: Secondary | ICD-10-CM | POA: Diagnosis not present

## 2020-07-04 DIAGNOSIS — E559 Vitamin D deficiency, unspecified: Secondary | ICD-10-CM | POA: Diagnosis not present

## 2020-07-10 DIAGNOSIS — R1032 Left lower quadrant pain: Secondary | ICD-10-CM | POA: Diagnosis not present

## 2020-07-11 ENCOUNTER — Other Ambulatory Visit: Payer: Self-pay | Admitting: Family Medicine

## 2020-07-11 ENCOUNTER — Ambulatory Visit
Admission: RE | Admit: 2020-07-11 | Discharge: 2020-07-11 | Disposition: A | Discharge status: Home / Self Care | Payer: PPO | Source: Ambulatory Visit | Attending: Family Medicine | Admitting: Family Medicine

## 2020-07-11 DIAGNOSIS — N200 Calculus of kidney: Secondary | ICD-10-CM | POA: Diagnosis not present

## 2020-07-11 DIAGNOSIS — K449 Diaphragmatic hernia without obstruction or gangrene: Secondary | ICD-10-CM | POA: Diagnosis not present

## 2020-07-11 DIAGNOSIS — K5732 Diverticulitis of large intestine without perforation or abscess without bleeding: Secondary | ICD-10-CM | POA: Diagnosis not present

## 2020-07-11 DIAGNOSIS — R1032 Left lower quadrant pain: Secondary | ICD-10-CM

## 2020-07-11 DIAGNOSIS — K6389 Other specified diseases of intestine: Secondary | ICD-10-CM | POA: Diagnosis not present

## 2020-07-11 MED ORDER — IOPAMIDOL (ISOVUE-300) INJECTION 61%
100.0000 mL | Freq: Once | INTRAVENOUS | Status: AC | PRN
  Administered 2020-07-11: 100 mL via INTRAVENOUS

## 2020-07-15 DIAGNOSIS — M65872 Other synovitis and tenosynovitis, left ankle and foot: Secondary | ICD-10-CM | POA: Diagnosis not present

## 2020-07-15 DIAGNOSIS — M13871 Other specified arthritis, right ankle and foot: Secondary | ICD-10-CM | POA: Diagnosis not present

## 2020-07-15 DIAGNOSIS — M2012 Hallux valgus (acquired), left foot: Secondary | ICD-10-CM | POA: Diagnosis not present

## 2020-07-15 DIAGNOSIS — M2021 Hallux rigidus, right foot: Secondary | ICD-10-CM | POA: Diagnosis not present

## 2020-07-23 DIAGNOSIS — M778 Other enthesopathies, not elsewhere classified: Secondary | ICD-10-CM | POA: Diagnosis not present

## 2020-07-23 DIAGNOSIS — M65331 Trigger finger, right middle finger: Secondary | ICD-10-CM | POA: Diagnosis not present

## 2020-07-29 DIAGNOSIS — R109 Unspecified abdominal pain: Secondary | ICD-10-CM | POA: Diagnosis not present

## 2020-07-29 DIAGNOSIS — K5792 Diverticulitis of intestine, part unspecified, without perforation or abscess without bleeding: Secondary | ICD-10-CM | POA: Diagnosis not present

## 2020-08-02 ENCOUNTER — Ambulatory Visit: Payer: PPO | Admitting: Interventional Cardiology

## 2020-08-14 DIAGNOSIS — K5732 Diverticulitis of large intestine without perforation or abscess without bleeding: Secondary | ICD-10-CM | POA: Diagnosis not present

## 2020-08-14 DIAGNOSIS — R1032 Left lower quadrant pain: Secondary | ICD-10-CM | POA: Diagnosis not present

## 2020-08-16 ENCOUNTER — Encounter: Payer: Self-pay | Admitting: Interventional Cardiology

## 2020-08-16 ENCOUNTER — Other Ambulatory Visit: Payer: Self-pay

## 2020-08-16 ENCOUNTER — Ambulatory Visit: Payer: PPO | Admitting: Interventional Cardiology

## 2020-08-16 VITALS — BP 130/80 | HR 89 | Ht 67.5 in | Wt 187.0 lb

## 2020-08-16 DIAGNOSIS — R002 Palpitations: Secondary | ICD-10-CM

## 2020-08-16 DIAGNOSIS — R42 Dizziness and giddiness: Secondary | ICD-10-CM

## 2020-08-16 DIAGNOSIS — I700 Atherosclerosis of aorta: Secondary | ICD-10-CM | POA: Diagnosis not present

## 2020-08-16 NOTE — Patient Instructions (Signed)
Medication Instructions:  Your provider recommends that you continue on your current medications as directed. Please refer to the Current Medication list given to you today.   *If you need a refill on your cardiac medications before your next appointment, please call your pharmacy*  Testing/Procedures: Dr. Irish Lack recommends you wear a ZIO PATCH for 2 weeks. This will be mailed to your home.  Follow-Up: Your provider recommends that you schedule a follow-up appointment AS NEEDED pending study results.   ZIO XT- Long Term Monitor Instructions  Your physician has requested you wear a ZIO patch monitor for 14 days.  This is a single patch monitor. [employer] supplies one patch monitor per enrollment. Additional stickers are not available. Please do not apply patch if you will be having a Nuclear Stress Test,  Echocardiogram, Cardiac CT, MRI, or Chest Xray during the period you would be wearing the  monitor. The patch cannot be worn during these tests. You cannot remove and re-apply the  ZIO XT patch monitor.  Your ZIO patch monitor will be mailed 3 day USPS to your address on file. It may take 3-5 days  to receive your monitor after you have been enrolled.  Once you have received your monitor, please review the enclosed instructions. Your monitor  has already been registered assigning a specific monitor serial # to you.  Billing and Patient Assistance Program Information  We have supplied [employer] with any of your insurance information on file for billing purposes. [employer] offers a sliding scale Patient Assistance Program for patients that do not have  insurance, or whose insurance does not completely cover the cost of the ZIO monitor.  You must apply for the Patient Assistance Program to qualify for this discounted rate.  To apply, please call [employer] at [phone], select option 4, select option 2, ask to apply for  Patient Assistance Program. Theodore Demark will ask your household income, and  how many people  are in your household. They will quote your out-of-pocket cost based on that information.  [employer] will also be able to set up a 14-month, interest-free payment plan if needed.  Applying the monitor   Shave hair from upper left chest.  Hold abrader disc by orange tab. Rub abrader in 40 strokes over the upper left chest as  indicated in your monitor instructions.  Clean area with 4 enclosed alcohol pads. Let dry.  Apply patch as indicated in monitor instructions. Patch will be placed under collarbone on left  side of chest with arrow pointing upward.  Rub patch adhesive wings for 2 minutes. Remove white label marked "1". Remove the white  label marked "2". Rub patch adhesive wings for 2 additional minutes.  While looking in a mirror, press and release button in center of patch. A small green light will  flash 3-4 times. This will be your only indicator that the monitor has been turned on.  Do not shower for the first 24 hours. You may shower after the first 24 hours.  Press the button if you feel a symptom. You will hear a small click. Record Date, Time and  Symptom in the Patient Logbook.  When you are ready to remove the patch, follow instructions on the last 2 pages of Patient  Logbook. Stick patch monitor onto the last page of Patient Logbook.  Place Patient Logbook in the blue and white box. Use locking tab on box and tape box closed  securely. The blue and white box has prepaid postage on it. Please  place it in the mailbox as  soon as possible. Your physician should have your test results approximately 7 days after the  monitor has been mailed back to [facility].  Call [place] at [phone] if you have questions regarding  your ZIO XT patch monitor. Call them immediately if you see an orange light blinking on your  monitor.  If your monitor falls off in less than 4 days, contact our Monitor department at [phone].  If your monitor  becomes loose or falls off after 4 days call [employer] at [phone] for  suggestions on securing your monitor

## 2020-08-16 NOTE — Progress Notes (Signed)
Cardiology Office Note   Date:  08/16/2020   ID:  Amy Glenn, Amy Glenn [birth year], MRN [id number]  PCP:  London Pepper, MD    No chief complaint on file.  palpitations  Wt Readings from Last 3 Encounters:  08/16/20 187 lb (84.8 kg)  02/21/20 180 lb (81.6 kg)  12/28/17 185 lb (83.9 kg)       History of Present Illness: Amy Glenn is a 70 y.o. female who is being seen today for the evaluation of palpitations at the request of London Pepper, MD.  She has sx a few times a week.  SHe has had PVCs in the past.  THese episodes feel different.  THey last 30 seconds at a time.  No trigger.  She has had some associated lightheadedness.  THey can occur at any time.   Denies : Chest pain. Leg edema. Nitroglycerin use. Orthopnea. Paroxysmal nocturnal dyspnea. Shortness of breath. Syncope.    Past Medical History:  Diagnosis Date  . Arthritis   . Chronic bronchitis ([place])   . Depression   . GERD (gastroesophageal reflux disease)   . Thyroid disease   . Wears hearing aid    both ears    Past Surgical History:  Procedure Laterality Date  . APPENDECTOMY    . CHOLECYSTECTOMY  1982  . DILATION AND CURETTAGE OF UTERUS  2003   fibroids  . ECTOPIC PREGNANCY SURGERY  1979x2   on rt  . [place]   x2 left  . EXPLORATORY LAPAROTOMY  8/77   for fertility  . HERNIA REPAIR  1995   lt LA  . KNEE ARTHROSCOPY  2008   rightx2  . KNEE ARTHROSCOPY Left 07/12/2012   Procedure: LEFT KNEE ARTHROSCOPY ;  Surgeon: Hessie Dibble, MD;  Location: [facility];  Service: Orthopedics;  Laterality: Left;  Left knee arthroscopy with partial medial menisectomy and chondroplasty  . LAPAROSCOPIC ABDOMINAL EXPLORATION  1978   repair fall tube scarring fertility  . PARTIAL KNEE ARTHROPLASTY  2010   right  . SHOULDER ARTHROSCOPY  2005   left     Current Outpatient Medications  Medication Sig Dispense Refill  . amitriptyline (ELAVIL) 10 MG tablet Take  10 mg by mouth at bedtime.    . dorzolamide-timolol (COSOPT) 22.3-6.8 MG/ML ophthalmic solution 1 drop 2 (two) times daily.    Arna Medici 50 MCG tablet Take 50 mcg by mouth every morning.    . fish oil-omega-3 fatty acids 1000 MG capsule Take 2 g by mouth daily.    . lansoprazole (PREVACID) 15 MG capsule Take by mouth.    . meloxicam (MOBIC) 15 MG tablet Take by mouth.    . meloxicam (MOBIC) 7.5 MG tablet Take 7.5 mg by mouth as needed.    . Multiple Vitamins-Minerals (MULTIVITAMIN WITH MINERALS) tablet Take 1 tablet by mouth daily.    Marland Kitchen PARoxetine (PAXIL) 20 MG tablet Take 20 mg by mouth every morning.    . zolpidem (AMBIEN) 5 MG tablet Take by mouth.     No current facility-administered medications for this visit.    Allergies:   Patient has no known allergies.    Social History:  The patient  reports that she has never smoked. She has never used smokeless tobacco. She reports current alcohol use. She reports that she does not use drugs.   Family History:  The patient's family history includes Hypertension in her sister.    ROS:  Please see  the history of present illness.   Otherwise, review of systems are positive for palpitations.   All other systems are reviewed and negative.    PHYSICAL EXAM: VS:  BP 130/80   Pulse 89   Ht 5' 7.5" (1.715 m)   Wt 187 lb (84.8 kg)   SpO2 96%   BMI 28.86 kg/m  , BMI Body mass index is 28.86 kg/m. GEN: Well nourished, well developed, in no acute distress  HEENT: normal  Neck: no JVD, carotid bruits, or masses Cardiac: RRR; no murmurs, rubs, or gallops,no edema  Respiratory:  clear to auscultation bilaterally, normal work of breathing GI: soft, nontender, nondistended, + BS MS: no deformity or atrophy  Skin: warm and dry, no rash Neuro:  Strength and sensation are intact Psych: euthymic mood, full affect   EKG:   The ekg ordered today demonstrates NSR   Recent Labs: 02/21/2020: ALT 49; BUN 12; Creatinine, Ser 0.78; Hemoglobin 15.1;  Platelets 300; Potassium 3.6; Sodium 138   Lipid Panel No results found for: CHOL, TRIG, HDL, CHOLHDL, VLDL, LDLCALC, LDLDIRECT   Other studies Reviewed: Additional studies/ records that were reviewed today with results demonstrating: prior ECG reviewed.   ASSESSMENT AND PLAN:  1. Palpitations: Plan 2 weeks Zio patch.  Does not sound like life-threatening arrhythmia.  Cardiac exam is normal.  No signs of heart failure or any significant murmurs. 2. Aortic atherosclerosis: Healthy diet.  Regular exercise will be important.  Would also consider statin therapy for this patient.  Her lab work is not available to me at this point. 3. Presyncope: Normal exam.  If symptoms persist, consider echocardiogram.  Stay well-hydrated.   Current medicines are reviewed at length with the patient today.  The patient concerns regarding her medicines were addressed.  The following changes have been made:  No change  Labs/ tests ordered today include:  No orders of the defined types were placed in this encounter.   Recommend 150 minutes/week of aerobic exercise Low fat, low carb, high fiber diet recommended  Disposition:   FU based on monitor   Signed, Larae Grooms, MD  08/16/2020 4:21 PM    [facility] [address], [place], [place]  [id number] Phone: [phone]; Fax: [phone]

## 2020-08-19 ENCOUNTER — Encounter (INDEPENDENT_AMBULATORY_CARE_PROVIDER_SITE_OTHER): Payer: PPO | Admitting: Ophthalmology

## 2020-08-19 ENCOUNTER — Ambulatory Visit (INDEPENDENT_AMBULATORY_CARE_PROVIDER_SITE_OTHER): Payer: PPO

## 2020-08-19 DIAGNOSIS — R002 Palpitations: Secondary | ICD-10-CM

## 2020-08-19 NOTE — Progress Notes (Unsigned)
Patient enrolled for [employer] to mail a 14 day ZIO XT monitor to her home. 

## 2020-08-22 DIAGNOSIS — R002 Palpitations: Secondary | ICD-10-CM

## 2020-08-23 ENCOUNTER — Other Ambulatory Visit: Payer: Self-pay

## 2020-08-23 ENCOUNTER — Encounter (INDEPENDENT_AMBULATORY_CARE_PROVIDER_SITE_OTHER): Payer: PPO | Admitting: Ophthalmology

## 2020-08-23 DIAGNOSIS — H33301 Unspecified retinal break, right eye: Secondary | ICD-10-CM | POA: Diagnosis not present

## 2020-08-23 DIAGNOSIS — H43813 Vitreous degeneration, bilateral: Secondary | ICD-10-CM

## 2020-08-26 DIAGNOSIS — K5732 Diverticulitis of large intestine without perforation or abscess without bleeding: Secondary | ICD-10-CM | POA: Diagnosis not present

## 2020-09-10 DIAGNOSIS — R002 Palpitations: Secondary | ICD-10-CM | POA: Diagnosis not present

## 2020-09-11 DIAGNOSIS — K632 Fistula of intestine: Secondary | ICD-10-CM | POA: Diagnosis not present

## 2020-09-11 DIAGNOSIS — R1032 Left lower quadrant pain: Secondary | ICD-10-CM | POA: Diagnosis not present

## 2020-09-11 DIAGNOSIS — K5792 Diverticulitis of intestine, part unspecified, without perforation or abscess without bleeding: Secondary | ICD-10-CM | POA: Diagnosis not present

## 2020-09-12 ENCOUNTER — Telehealth: Payer: Self-pay | Admitting: *Deleted

## 2020-09-12 DIAGNOSIS — Z79899 Other long term (current) drug therapy: Secondary | ICD-10-CM

## 2020-09-12 DIAGNOSIS — I700 Atherosclerosis of aorta: Secondary | ICD-10-CM

## 2020-09-12 MED ORDER — ROSUVASTATIN CALCIUM 10 MG PO TABS: 10.0000 mg | ORAL_TABLET | Freq: Every day | ORAL | 3 refills | Status: DC

## 2020-09-12 NOTE — Telephone Encounter (Signed)
Patient notified.  She would like to start Rosuvastatin.  Prescription sent to [facility] on [address].  She will come in for fasting lab work on September 30,2022.

## 2020-09-12 NOTE — Telephone Encounter (Signed)
-----   Message from Jettie Booze, MD sent at 09/12/2020  5:03 PM EDT -----  Normal sinus rhythm with rare PACs and PVCs.  Single brief, run of PACs lasting just a few seconds. No associated symptoms.  No atrial fibrillation.  Patient had symptoms with sinus tachycardia, HR 105.  No sustained pathologic arrhythmias.  Abdominal CT from 06/2020 showed aortic atherosclerosis.  Statin is recommended.  If she is willing, can start rosuvastatin 10 mg daily with lipids and liver in 3 months.  F/u in one year.

## 2020-10-07 ENCOUNTER — Ambulatory Visit: Payer: PPO | Admitting: Interventional Cardiology

## 2020-10-24 DIAGNOSIS — K581 Irritable bowel syndrome with constipation: Secondary | ICD-10-CM | POA: Diagnosis not present

## 2020-10-24 DIAGNOSIS — K5732 Diverticulitis of large intestine without perforation or abscess without bleeding: Secondary | ICD-10-CM | POA: Diagnosis not present

## 2020-11-13 DIAGNOSIS — Z86010 Personal history of colonic polyps: Secondary | ICD-10-CM | POA: Diagnosis not present

## 2020-11-13 DIAGNOSIS — K5792 Diverticulitis of intestine, part unspecified, without perforation or abscess without bleeding: Secondary | ICD-10-CM | POA: Diagnosis not present

## 2020-11-20 DIAGNOSIS — L700 Acne vulgaris: Secondary | ICD-10-CM | POA: Diagnosis not present

## 2020-11-20 DIAGNOSIS — L309 Dermatitis, unspecified: Secondary | ICD-10-CM | POA: Diagnosis not present

## 2020-11-20 DIAGNOSIS — D225 Melanocytic nevi of trunk: Secondary | ICD-10-CM | POA: Diagnosis not present

## 2020-11-20 DIAGNOSIS — Z85828 Personal history of other malignant neoplasm of skin: Secondary | ICD-10-CM | POA: Diagnosis not present

## 2020-11-20 DIAGNOSIS — L820 Inflamed seborrheic keratosis: Secondary | ICD-10-CM | POA: Diagnosis not present

## 2020-11-27 DIAGNOSIS — I8312 Varicose veins of left lower extremity with inflammation: Secondary | ICD-10-CM | POA: Diagnosis not present

## 2020-11-27 DIAGNOSIS — I8311 Varicose veins of right lower extremity with inflammation: Secondary | ICD-10-CM | POA: Diagnosis not present

## 2020-11-27 DIAGNOSIS — I83813 Varicose veins of bilateral lower extremities with pain: Secondary | ICD-10-CM | POA: Diagnosis not present

## 2020-11-27 DIAGNOSIS — I83893 Varicose veins of bilateral lower extremities with other complications: Secondary | ICD-10-CM | POA: Diagnosis not present

## 2020-11-28 DIAGNOSIS — M778 Other enthesopathies, not elsewhere classified: Secondary | ICD-10-CM | POA: Diagnosis not present

## 2020-11-28 DIAGNOSIS — M65331 Trigger finger, right middle finger: Secondary | ICD-10-CM | POA: Diagnosis not present

## 2020-12-13 ENCOUNTER — Other Ambulatory Visit: Payer: PPO

## 2020-12-17 DIAGNOSIS — R7309 Other abnormal glucose: Secondary | ICD-10-CM | POA: Diagnosis not present

## 2020-12-17 DIAGNOSIS — R945 Abnormal results of liver function studies: Secondary | ICD-10-CM | POA: Diagnosis not present

## 2020-12-17 DIAGNOSIS — R109 Unspecified abdominal pain: Secondary | ICD-10-CM | POA: Diagnosis not present

## 2020-12-17 DIAGNOSIS — E039 Hypothyroidism, unspecified: Secondary | ICD-10-CM | POA: Diagnosis not present

## 2020-12-17 DIAGNOSIS — Z23 Encounter for immunization: Secondary | ICD-10-CM | POA: Diagnosis not present

## 2020-12-17 DIAGNOSIS — G4700 Insomnia, unspecified: Secondary | ICD-10-CM | POA: Diagnosis not present

## 2020-12-27 DIAGNOSIS — I8311 Varicose veins of right lower extremity with inflammation: Secondary | ICD-10-CM | POA: Diagnosis not present

## 2020-12-27 DIAGNOSIS — I83893 Varicose veins of bilateral lower extremities with other complications: Secondary | ICD-10-CM | POA: Diagnosis not present

## 2020-12-27 DIAGNOSIS — I83813 Varicose veins of bilateral lower extremities with pain: Secondary | ICD-10-CM | POA: Diagnosis not present

## 2020-12-27 DIAGNOSIS — I8312 Varicose veins of left lower extremity with inflammation: Secondary | ICD-10-CM | POA: Diagnosis not present

## 2021-01-07 DIAGNOSIS — I83813 Varicose veins of bilateral lower extremities with pain: Secondary | ICD-10-CM | POA: Diagnosis not present

## 2021-01-07 DIAGNOSIS — I8312 Varicose veins of left lower extremity with inflammation: Secondary | ICD-10-CM | POA: Diagnosis not present

## 2021-01-07 DIAGNOSIS — I83893 Varicose veins of bilateral lower extremities with other complications: Secondary | ICD-10-CM | POA: Diagnosis not present

## 2021-01-07 DIAGNOSIS — I8311 Varicose veins of right lower extremity with inflammation: Secondary | ICD-10-CM | POA: Diagnosis not present

## 2021-01-29 ENCOUNTER — Emergency Department (HOSPITAL_BASED_OUTPATIENT_CLINIC_OR_DEPARTMENT_OTHER): Payer: PPO

## 2021-01-29 ENCOUNTER — Emergency Department (HOSPITAL_BASED_OUTPATIENT_CLINIC_OR_DEPARTMENT_OTHER)
Admission: EM | Admit: 2021-01-29 | Discharge: 2021-01-29 | Disposition: A | Discharge status: Home / Self Care | Payer: PPO | Attending: Emergency Medicine | Admitting: Emergency Medicine

## 2021-01-29 ENCOUNTER — Other Ambulatory Visit: Payer: Self-pay

## 2021-01-29 ENCOUNTER — Encounter (HOSPITAL_BASED_OUTPATIENT_CLINIC_OR_DEPARTMENT_OTHER): Payer: Self-pay

## 2021-01-29 DIAGNOSIS — T50905A Adverse effect of unspecified drugs, medicaments and biological substances, initial encounter: Secondary | ICD-10-CM | POA: Insufficient documentation

## 2021-01-29 DIAGNOSIS — Z96651 Presence of right artificial knee joint: Secondary | ICD-10-CM | POA: Insufficient documentation

## 2021-01-29 DIAGNOSIS — R42 Dizziness and giddiness: Secondary | ICD-10-CM | POA: Diagnosis not present

## 2021-01-29 DIAGNOSIS — R0602 Shortness of breath: Secondary | ICD-10-CM | POA: Diagnosis not present

## 2021-01-29 DIAGNOSIS — Y900 Blood alcohol level of less than 20 mg/100 ml: Secondary | ICD-10-CM | POA: Diagnosis not present

## 2021-01-29 DIAGNOSIS — R4182 Altered mental status, unspecified: Secondary | ICD-10-CM | POA: Insufficient documentation

## 2021-01-29 DIAGNOSIS — R000 Tachycardia, unspecified: Secondary | ICD-10-CM | POA: Insufficient documentation

## 2021-01-29 DIAGNOSIS — Z20822 Contact with and (suspected) exposure to covid-19: Secondary | ICD-10-CM | POA: Diagnosis not present

## 2021-01-29 DIAGNOSIS — R079 Chest pain, unspecified: Secondary | ICD-10-CM | POA: Diagnosis not present

## 2021-01-29 DIAGNOSIS — R7989 Other specified abnormal findings of blood chemistry: Secondary | ICD-10-CM | POA: Diagnosis not present

## 2021-01-29 DIAGNOSIS — E041 Nontoxic single thyroid nodule: Secondary | ICD-10-CM | POA: Diagnosis not present

## 2021-01-29 DIAGNOSIS — I951 Orthostatic hypotension: Secondary | ICD-10-CM | POA: Diagnosis not present

## 2021-01-29 DIAGNOSIS — J9811 Atelectasis: Secondary | ICD-10-CM | POA: Diagnosis not present

## 2021-01-29 LAB — ACETAMINOPHEN LEVEL: Acetaminophen (Tylenol), Serum: <10 ug/mL — ABNORMAL LOW (ref 10–30)

## 2021-01-29 LAB — CBC WITH DIFFERENTIAL/PLATELET
Abs Immature Granulocytes: 0.06 10*3/uL (ref 0.00–0.07)
Basophils Absolute: 0.1 10*3/uL (ref 0.0–0.1)
Basophils Relative: 1 %
Eosinophils Absolute: 0.1 10*3/uL (ref 0.0–0.5)
Eosinophils Relative: 1 %
HCT: 41.7 % (ref 36.0–46.0)
Hemoglobin: 14.2 g/dL (ref 12.0–15.0)
Immature Granulocytes: 1 %
Lymphocytes Relative: 16 %
Lymphs Abs: 2 10*3/uL (ref 0.7–4.0)
MCH: 29.9 pg (ref 26.0–34.0)
MCHC: 34.1 g/dL (ref 30.0–36.0)
MCV: 87.8 fL (ref 80.0–100.0)
Monocytes Absolute: 1 10*3/uL (ref 0.1–1.0)
Monocytes Relative: 8 %
Neutro Abs: 8.9 10*3/uL — ABNORMAL HIGH (ref 1.7–7.7)
Neutrophils Relative %: 73 %
Platelets: 285 10*3/uL (ref 150–400)
RBC: 4.75 MIL/uL (ref 3.87–5.11)
RDW: 13.7 % (ref 11.5–15.5)
WBC: 12.1 10*3/uL — ABNORMAL HIGH (ref 4.0–10.5)
nRBC: 0 % (ref 0.0–0.2)

## 2021-01-29 LAB — ETHANOL: Alcohol, Ethyl (B): <10 mg/dL (ref <10)

## 2021-01-29 LAB — RAPID URINE DRUG SCREEN, HOSP PERFORMED
Amphetamines: NOT DETECTED
Barbiturates: NOT DETECTED
Benzodiazepines: NOT DETECTED
Cocaine: NOT DETECTED
Opiates: POSITIVE — AB
Tetrahydrocannabinol: NOT DETECTED

## 2021-01-29 LAB — COMPREHENSIVE METABOLIC PANEL
ALT: 56 U/L — ABNORMAL HIGH (ref 0–44)
AST: 34 U/L (ref 15–41)
Albumin: 3.9 g/dL (ref 3.5–5.0)
Alkaline Phosphatase: 55 U/L (ref 38–126)
Anion gap: 9 (ref 5–15)
BUN: 14 mg/dL (ref 8–23)
CO2: 25 mmol/L (ref 22–32)
Calcium: 9.2 mg/dL (ref 8.9–10.3)
Chloride: 103 mmol/L (ref 98–111)
Creatinine, Ser: 0.74 mg/dL (ref 0.44–1.00)
GFR, Estimated: >60 mL/min (ref >60)
Glucose, Bld: 115 mg/dL — ABNORMAL HIGH (ref 70–99)
Potassium: 4 mmol/L (ref 3.5–5.1)
Sodium: 137 mmol/L (ref 135–145)
Total Bilirubin: 0.4 mg/dL (ref 0.3–1.2)
Total Protein: 6.8 g/dL (ref 6.5–8.1)

## 2021-01-29 LAB — URINALYSIS, ROUTINE W REFLEX MICROSCOPIC
Bilirubin Urine: NEGATIVE
Glucose, UA: NEGATIVE mg/dL
Hgb urine dipstick: NEGATIVE
Ketones, ur: NEGATIVE mg/dL
Leukocytes,Ua: NEGATIVE
Nitrite: NEGATIVE
Protein, ur: NEGATIVE mg/dL
Specific Gravity, Urine: 1.015 (ref 1.005–1.030)
pH: 6 (ref 5.0–8.0)

## 2021-01-29 LAB — RESP PANEL BY RT-PCR (FLU A&B, COVID) ARPGX2
Influenza A by PCR: NEGATIVE
Influenza B by PCR: NEGATIVE
SARS Coronavirus 2 by RT PCR: NEGATIVE

## 2021-01-29 LAB — TROPONIN I (HIGH SENSITIVITY)
Troponin I (High Sensitivity): 5 ng/L (ref <18)
Troponin I (High Sensitivity): 4 ng/L (ref <18)

## 2021-01-29 LAB — SALICYLATE LEVEL: Salicylate Lvl: <7 mg/dL — ABNORMAL LOW (ref 7.0–30.0)

## 2021-01-29 MED ORDER — SODIUM CHLORIDE 0.9 % IV BOLUS
1000.0000 mL | Freq: Once | INTRAVENOUS | Status: AC
  Administered 2021-01-29: 1000 mL via INTRAVENOUS

## 2021-01-29 MED ORDER — IOHEXOL 350 MG/ML SOLN
75.0000 mL | Freq: Once | INTRAVENOUS | Status: AC | PRN
  Administered 2021-01-29: 75 mL via INTRAVENOUS

## 2021-01-29 NOTE — ED Provider Notes (Signed)
[facility] Provider Note   CSN: [id number] Arrival date & time: 01/29/21  0248     History Chief Complaint  Patient presents with   Dizziness    Pt complaining of having dizziness today, feels weak, said the last time she felt like this she had an electrolyte imbalance    Amy Glenn is a 70 y.o. female.  The history is provided by the EMS personnel and the patient. The history is limited by the condition of the patient.  Dizziness Quality:  Lightheadedness Severity:  Moderate Onset quality:  Gradual Duration:  1 day Timing:  Constant Progression:  Unchanged Chronicity:  Recurrent Context: not with physical activity and not when urinating   Relieved by:  Nothing Worsened by:  Nothing Ineffective treatments:  None tried Associated symptoms: no blood in stool, no chest pain, no diarrhea, no headaches, no nausea, no palpitations, no shortness of breath, no syncope, no tinnitus, no vision changes, no vomiting and no weakness   Risk factors: no anemia and no hx of vertigo   States she is here for a "electrolyte imbalance".  States last time she was seen here was after a colonoscopy and she had a "electrolyte imbalance" .  She cannot say which electrolyte was the issue.  She denies chest pain, shortness of breath,  fevers, abdominal pain.  No f/c/r.  No n/v/d.  She reports feeling lightheaded and confused.  No focal weakness.  No changes in vision or speech.      Past Medical History:  Diagnosis Date   Arthritis    Chronic bronchitis ([place])    Depression    GERD (gastroesophageal reflux disease)    Thyroid disease    Wears hearing aid    both ears    Patient Active Problem List   Diagnosis Date Noted   COUGH 05/17/2008    Past Surgical History:  Procedure Laterality Date   APPENDECTOMY     CHOLECYSTECTOMY  1982   DILATION AND CURETTAGE OF UTERUS  2003   fibroids   ECTOPIC PREGNANCY SURGERY  1979x2   on rt   ECTOPIC PREGNANCY  SURGERY  1980   x2 left   EXPLORATORY LAPAROTOMY  8/77   for fertility   HERNIA REPAIR  1995   lt LA   KNEE ARTHROSCOPY  2008   rightx2   KNEE ARTHROSCOPY Left 07/12/2012   Procedure: LEFT KNEE ARTHROSCOPY ;  Surgeon: Hessie Dibble, MD;  Location: [facility];  Service: Orthopedics;  Laterality: Left;  Left knee arthroscopy with partial medial menisectomy and chondroplasty   LAPAROSCOPIC ABDOMINAL EXPLORATION  1978   repair fall tube scarring fertility   PARTIAL KNEE ARTHROPLASTY  2010   right   SHOULDER ARTHROSCOPY  2005   left     OB History   No obstetric history on file.     Family History  Problem Relation Age of Onset   Hypertension Sister     Social History   Tobacco Use   Smoking status: Never   Smokeless tobacco: Never  Vaping Use   Vaping Use: Never used  Substance Use Topics   Alcohol use: Yes    Comment: occ   Drug use: No    Home Medications Prior to Admission medications   Medication Sig Start Date End Date Taking? Authorizing Provider  amitriptyline (ELAVIL) 10 MG tablet Take 10 mg by mouth at bedtime.    [provider]  dorzolamide-timolol (COSOPT) 22.3-6.8 MG/ML ophthalmic solution  1 drop 2 (two) times daily. 01/11/20   [provider]  EUTHYROX 50 MCG tablet Take 50 mcg by mouth every morning. 11/25/19   [provider]  fish oil-omega-3 fatty acids 1000 MG capsule Take 2 g by mouth daily.    [provider]  lansoprazole (PREVACID) 15 MG capsule Take by mouth.    [provider]  meloxicam (MOBIC) 15 MG tablet Take by mouth.    [provider]  meloxicam (MOBIC) 7.5 MG tablet Take 7.5 mg by mouth as needed. 02/29/20   [provider]  Multiple Vitamins-Minerals (MULTIVITAMIN WITH MINERALS) tablet Take 1 tablet by mouth daily.    [provider]  PARoxetine (PAXIL) 20 MG tablet Take 20 mg by mouth every morning.    [provider]  rosuvastatin  (CRESTOR) 10 MG tablet Take 1 tablet (10 mg total) by mouth daily. 09/12/20   Jettie Booze, MD  zolpidem (AMBIEN) 5 MG tablet Take by mouth.    [provider]    Allergies    Patient has no known allergies.  Review of Systems   Review of Systems  Constitutional:  Negative for fever.  HENT:  Negative for facial swelling and tinnitus.   Eyes:  Negative for redness and visual disturbance.  Respiratory:  Negative for shortness of breath, wheezing and stridor.   Cardiovascular:  Negative for chest pain, palpitations, leg swelling and syncope.  Gastrointestinal:  Negative for blood in stool, diarrhea, nausea and vomiting.  Genitourinary:  Negative for dysuria.  Musculoskeletal:  Negative for neck pain and neck stiffness.  Skin:  Negative for rash.  Neurological:  Positive for light-headedness. Negative for facial asymmetry, speech difficulty, weakness, numbness and headaches.  Psychiatric/Behavioral:  Positive for confusion.    Physical Exam Updated Vital Signs BP 134/88   Pulse (!) 130   Temp 98.1 F (36.7 C) (Oral)   Resp 15   Ht 5' 7.5" (1.715 m)   Wt 86.2 kg   SpO2 95%   BMI 29.32 kg/m   Physical Exam Vitals and nursing note reviewed.  Constitutional:      General: She is not in acute distress.    Appearance: Normal appearance.  HENT:     Head: Normocephalic and atraumatic.     Nose: Nose normal.     Mouth/Throat:     Mouth: Mucous membranes are moist.  Eyes:     Extraocular Movements: Extraocular movements intact.     Conjunctiva/sclera: Conjunctivae normal.     Pupils: Pupils are equal, round, and reactive to light.     Comments: Pupils are dilated in the light   Cardiovascular:     Rate and Rhythm: Normal rate and regular rhythm.     Pulses: Normal pulses.     Heart sounds: Normal heart sounds.  Pulmonary:     Effort: Pulmonary effort is normal.     Breath sounds: Normal breath sounds.  Abdominal:     General: Abdomen is flat. Bowel sounds  are normal.     Palpations: Abdomen is soft.     Tenderness: There is no abdominal tenderness. There is no guarding.  Musculoskeletal:        General: No tenderness. Normal range of motion.     Cervical back: Normal range of motion and neck supple.     Right lower leg: No edema.     Left lower leg: No edema.  Skin:    General: Skin is warm and dry.  Neurological:  General: No focal deficit present.     Mental Status: She is alert.     Cranial Nerves: No cranial nerve deficit.     Gait: Gait normal.     Deep Tendon Reflexes: Reflexes normal.  Psychiatric:        Mood and Affect: Mood normal.        Behavior: Behavior normal.    ED Results / Procedures / Treatments   Labs (all labs ordered are listed, but only abnormal results are displayed) Results for orders placed or performed during the hospital encounter of 01/29/21  CBC with Differential/Platelet  Result Value Ref Range   WBC 12.1 (H) 4.0 - 10.5 K/uL   RBC 4.75 3.87 - 5.11 MIL/uL   Hemoglobin 14.2 12.0 - 15.0 g/dL   HCT 41.7 36.0 - 46.0 %   MCV 87.8 80.0 - 100.0 fL   MCH 29.9 26.0 - 34.0 pg   MCHC 34.1 30.0 - 36.0 g/dL   RDW 13.7 11.5 - 15.5 %   Platelets 285 150 - 400 K/uL   nRBC 0.0 0.0 - 0.2 %   Neutrophils Relative % 73 %   Neutro Abs 8.9 (H) 1.7 - 7.7 K/uL   Lymphocytes Relative 16 %   Lymphs Abs 2.0 0.7 - 4.0 K/uL   Monocytes Relative 8 %   Monocytes Absolute 1.0 0.1 - 1.0 K/uL   Eosinophils Relative 1 %   Eosinophils Absolute 0.1 0.0 - 0.5 K/uL   Basophils Relative 1 %   Basophils Absolute 0.1 0.0 - 0.1 K/uL   Immature Granulocytes 1 %   Abs Immature Granulocytes 0.06 0.00 - 0.07 K/uL  Comprehensive metabolic panel  Result Value Ref Range   Sodium 137 135 - 145 mmol/L   Potassium 4.0 3.5 - 5.1 mmol/L   Chloride 103 98 - 111 mmol/L   CO2 25 22 - 32 mmol/L   Glucose, Bld 115 (H) 70 - 99 mg/dL   BUN 14 8 - 23 mg/dL   Creatinine, Ser 0.74 0.44 - 1.00 mg/dL   Calcium 9.2 8.9 - 10.3 mg/dL   Total  Protein 6.8 6.5 - 8.1 g/dL   Albumin 3.9 3.5 - 5.0 g/dL   AST 34 15 - 41 U/L   ALT 56 (H) 0 - 44 U/L   Alkaline Phosphatase 55 38 - 126 U/L   Total Bilirubin 0.4 0.3 - 1.2 mg/dL   GFR, Estimated >60 >60 mL/min   Anion gap 9 5 - 15  Urinalysis, Routine w reflex microscopic Urine, Clean Catch  Result Value Ref Range   Color, Urine YELLOW YELLOW   APPearance HAZY (A) CLEAR   Specific Gravity, Urine 1.015 1.005 - 1.030   pH 6.0 5.0 - 8.0   Glucose, UA NEGATIVE NEGATIVE mg/dL   Hgb urine dipstick NEGATIVE NEGATIVE   Bilirubin Urine NEGATIVE NEGATIVE   Ketones, ur NEGATIVE NEGATIVE mg/dL   Protein, ur NEGATIVE NEGATIVE mg/dL   Nitrite NEGATIVE NEGATIVE   Leukocytes,Ua NEGATIVE NEGATIVE  Rapid urine drug screen (hospital performed)  Result Value Ref Range   Opiates POSITIVE (A) NONE DETECTED   Cocaine NONE DETECTED NONE DETECTED   Benzodiazepines NONE DETECTED NONE DETECTED   Amphetamines NONE DETECTED NONE DETECTED   Tetrahydrocannabinol NONE DETECTED NONE DETECTED   Barbiturates NONE DETECTED NONE DETECTED  Acetaminophen level  Result Value Ref Range   Acetaminophen (Tylenol), Serum <10 (L) 10 - 30 ug/mL  Salicylate level  Result Value Ref Range   Salicylate Lvl <6.5 (L) 7.0 -  30.0 mg/dL   CT Head Wo Contrast  Result Date: 01/29/2021 CLINICAL DATA:  Dizziness EXAM: CT HEAD WITHOUT CONTRAST TECHNIQUE: Contiguous axial images were obtained from the base of the skull through the vertex without intravenous contrast. COMPARISON:  12/28/2017 FINDINGS: Brain: Mild atrophic changes are noted. No findings to suggest acute hemorrhage, acute infarction or space-occupying mass lesion are noted. Vascular: No hyperdense vessel or unexpected calcification. Skull: Normal. Negative for fracture or focal lesion. Sinuses/Orbits: No acute finding. Other: None. IMPRESSION: Mild atrophic changes without acute abnormality. Electronically Signed   By: Inez Catalina M.D.   On: 01/29/2021 03:33   DG Chest  Portable 1 View  Result Date: 01/29/2021 CLINICAL DATA:  Dizziness EXAM: PORTABLE CHEST 1 VIEW COMPARISON:  02/21/2019 FINDINGS: Cardiac shadow is within normal limits. Previously seen left-sided infiltrate has resolved. No focal confluent infiltrate is seen. No effusion or pneumothorax is noted. No bony abnormality is seen. IMPRESSION: No acute abnormality noted. Electronically Signed   By: Inez Catalina M.D.   On: 01/29/2021 03:51     EKG EKG Interpretation  Date/Time:  Wednesday January 29 2021 03:22:00 EST Ventricular Rate:  109 PR Interval:  135 QRS Duration: 92 QT Interval:  327 QTC Calculation: 441 R Axis:   49 Text Interpretation: Sinus tachycardia Multiple ventricular premature complexes RSR' in V1 or V2, right VCD or RVH Confirmed by Randal Buba, Graelyn Bihl ([id number]) on 01/29/2021 3:59:04 AM  Radiology CT Head Wo Contrast  Result Date: 01/29/2021 CLINICAL DATA:  Dizziness EXAM: CT HEAD WITHOUT CONTRAST TECHNIQUE: Contiguous axial images were obtained from the base of the skull through the vertex without intravenous contrast. COMPARISON:  12/28/2017 FINDINGS: Brain: Mild atrophic changes are noted. No findings to suggest acute hemorrhage, acute infarction or space-occupying mass lesion are noted. Vascular: No hyperdense vessel or unexpected calcification. Skull: Normal. Negative for fracture or focal lesion. Sinuses/Orbits: No acute finding. Other: None. IMPRESSION: Mild atrophic changes without acute abnormality. Electronically Signed   By: Inez Catalina M.D.   On: 01/29/2021 03:33   DG Chest Portable 1 View  Result Date: 01/29/2021 CLINICAL DATA:  Dizziness EXAM: PORTABLE CHEST 1 VIEW COMPARISON:  02/21/2019 FINDINGS: Cardiac shadow is within normal limits. Previously seen left-sided infiltrate has resolved. No focal confluent infiltrate is seen. No effusion or pneumothorax is noted. No bony abnormality is seen. IMPRESSION: No acute abnormality noted. Electronically Signed   By: Inez Catalina M.D.   On: 01/29/2021 03:51    Procedures Procedures   Medications Ordered in ED Medications  sodium chloride 0.9 % bolus 1,000 mL (1,000 mLs Intravenous New Bag/Given 01/29/21 [id number])    ED Course  I have reviewed the triage vital signs and the nursing notes.  Pertinent labs & imaging results that were available during my care of the patient were reviewed by me and considered in my medical decision making (see chart for details).  No signs of stroke on exam or CT scan given orthostasis, CTA looking for PNA or aspiration.  This was negative for PNA and PE, also ruled out for MI in the ED.     Patient is known to have a mildly elevated AST so this is not new.  What is unexpected is the positive UDS.  Patient denies being on any pain medication or ingesting someone's medication as she lives alone.  She does not hold a prescription for opioids according to the [facility] but this could certainly make the patient lightheaded and confused.  Moreover she did not have an  electrolyte abnormality last time she was seen she had an aspiration pneumonia.    I am not sure where the patient got an opioid from, but this makes sense with her clinical picture.  She is AO3 at this time.  No SI and has been ruled out for all life threatening illnesses.  Do not use opioids.     Final Clinical Impression(s) / ED Diagnoses Final diagnoses:  Altered mental status, unspecified altered mental status type  Elevated LFTs  Adverse drug effect, initial encounter  Orthostasis   Return for intractable cough, coughing up blood, fevers > 100.4 unrelieved by medication, shortness of breath, intractable vomiting, chest pain, shortness of breath, weakness, numbness, changes in speech, facial asymmetry, abdominal pain, passing out, Inability to tolerate liquids or food, cough, altered mental status or any concerns. No signs of systemic illness or infection. The patient is nontoxic-appearing on exam and vital signs  are within normal limits.  I have reviewed the triage vital signs and the nursing notes. Pertinent labs & imaging results that were available during my care of the patient were reviewed by me and considered in my medical decision making (see chart for details). After history, exam, and medical workup I feel the patient has been appropriately medically screened and is safe for discharge home. Pertinent diagnoses were discussed with the patient. Patient was given return precautions. Rx / DC Orders ED Discharge Orders     None        Frans Valente, MD 01/29/21 [phone]

## 2021-01-29 NOTE — ED Notes (Signed)
Pt is calling her ride to come and get her

## 2021-01-29 NOTE — ED Triage Notes (Addendum)
Pt A&O x 4, said that she is dizzy. Dizziness started not last night but the night before.

## 2021-01-29 NOTE — Progress Notes (Signed)
Second IV placed in right forearm due to IV in left AC pressure limiting during injection. Scan repeated per Dr Tery Sanfilippo and estimated contrast extravasation of less than 10 mls contrast toward end of injection. Heat pack applied and nursing staff notified.

## 2021-01-30 DIAGNOSIS — M6281 Muscle weakness (generalized): Secondary | ICD-10-CM | POA: Diagnosis not present

## 2021-01-30 DIAGNOSIS — R42 Dizziness and giddiness: Secondary | ICD-10-CM | POA: Diagnosis not present

## 2021-01-30 DIAGNOSIS — R1032 Left lower quadrant pain: Secondary | ICD-10-CM | POA: Diagnosis not present

## 2021-01-30 DIAGNOSIS — R410 Disorientation, unspecified: Secondary | ICD-10-CM | POA: Diagnosis not present

## 2021-01-30 LAB — URINE CULTURE: Special Requests: NORMAL

## 2021-02-11 ENCOUNTER — Other Ambulatory Visit (HOSPITAL_COMMUNITY): Payer: Self-pay | Admitting: Surgery

## 2021-02-11 ENCOUNTER — Other Ambulatory Visit: Payer: Self-pay | Admitting: Surgery

## 2021-02-11 DIAGNOSIS — R1032 Left lower quadrant pain: Secondary | ICD-10-CM | POA: Diagnosis not present

## 2021-02-11 DIAGNOSIS — R109 Unspecified abdominal pain: Secondary | ICD-10-CM | POA: Diagnosis not present

## 2021-02-14 ENCOUNTER — Other Ambulatory Visit: Payer: Self-pay

## 2021-02-14 ENCOUNTER — Ambulatory Visit (HOSPITAL_BASED_OUTPATIENT_CLINIC_OR_DEPARTMENT_OTHER)
Admission: RE | Admit: 2021-02-14 | Discharge: 2021-02-14 | Disposition: A | Discharge status: Home / Self Care | Payer: PPO | Source: Ambulatory Visit | Attending: Surgery | Admitting: Surgery

## 2021-02-14 DIAGNOSIS — R1032 Left lower quadrant pain: Secondary | ICD-10-CM | POA: Insufficient documentation

## 2021-02-14 DIAGNOSIS — R109 Unspecified abdominal pain: Secondary | ICD-10-CM | POA: Diagnosis not present

## 2021-02-14 DIAGNOSIS — N200 Calculus of kidney: Secondary | ICD-10-CM | POA: Diagnosis not present

## 2021-02-14 MED ORDER — IOHEXOL 300 MG/ML  SOLN
100.0000 mL | Freq: Once | INTRAMUSCULAR | Status: AC | PRN
  Administered 2021-02-14: 100 mL via INTRAVENOUS

## 2021-02-28 ENCOUNTER — Encounter (HOSPITAL_COMMUNITY): Payer: Self-pay | Admitting: Radiology

## 2021-03-20 DIAGNOSIS — M79604 Pain in right leg: Secondary | ICD-10-CM | POA: Diagnosis not present

## 2021-03-20 DIAGNOSIS — Z09 Encounter for follow-up examination after completed treatment for conditions other than malignant neoplasm: Secondary | ICD-10-CM | POA: Diagnosis not present

## 2021-03-20 DIAGNOSIS — I83891 Varicose veins of right lower extremities with other complications: Secondary | ICD-10-CM | POA: Diagnosis not present

## 2021-04-02 DIAGNOSIS — Z1231 Encounter for screening mammogram for malignant neoplasm of breast: Secondary | ICD-10-CM | POA: Diagnosis not present

## 2021-04-07 DIAGNOSIS — E039 Hypothyroidism, unspecified: Secondary | ICD-10-CM | POA: Diagnosis not present

## 2021-04-11 DIAGNOSIS — R928 Other abnormal and inconclusive findings on diagnostic imaging of breast: Secondary | ICD-10-CM | POA: Diagnosis not present

## 2021-04-11 DIAGNOSIS — M19072 Primary osteoarthritis, left ankle and foot: Secondary | ICD-10-CM | POA: Diagnosis not present

## 2021-04-11 DIAGNOSIS — N6489 Other specified disorders of breast: Secondary | ICD-10-CM | POA: Diagnosis not present

## 2021-04-11 DIAGNOSIS — R922 Inconclusive mammogram: Secondary | ICD-10-CM | POA: Diagnosis not present

## 2021-04-11 DIAGNOSIS — M7732 Calcaneal spur, left foot: Secondary | ICD-10-CM | POA: Diagnosis not present

## 2021-04-15 ENCOUNTER — Encounter (HOSPITAL_BASED_OUTPATIENT_CLINIC_OR_DEPARTMENT_OTHER): Payer: Self-pay

## 2021-04-15 ENCOUNTER — Other Ambulatory Visit: Payer: Self-pay

## 2021-04-15 ENCOUNTER — Emergency Department (HOSPITAL_BASED_OUTPATIENT_CLINIC_OR_DEPARTMENT_OTHER)
Admission: EM | Admit: 2021-04-15 | Discharge: 2021-04-16 | Disposition: A | Discharge status: Home / Self Care | Payer: PPO | Attending: Emergency Medicine | Admitting: Emergency Medicine

## 2021-04-15 DIAGNOSIS — R109 Unspecified abdominal pain: Secondary | ICD-10-CM | POA: Diagnosis not present

## 2021-04-15 DIAGNOSIS — N200 Calculus of kidney: Secondary | ICD-10-CM | POA: Diagnosis not present

## 2021-04-15 DIAGNOSIS — E860 Dehydration: Secondary | ICD-10-CM | POA: Diagnosis not present

## 2021-04-15 DIAGNOSIS — K5792 Diverticulitis of intestine, part unspecified, without perforation or abscess without bleeding: Secondary | ICD-10-CM | POA: Diagnosis not present

## 2021-04-15 DIAGNOSIS — Z79899 Other long term (current) drug therapy: Secondary | ICD-10-CM | POA: Diagnosis not present

## 2021-04-15 DIAGNOSIS — K5732 Diverticulitis of large intestine without perforation or abscess without bleeding: Secondary | ICD-10-CM | POA: Diagnosis not present

## 2021-04-15 DIAGNOSIS — R112 Nausea with vomiting, unspecified: Secondary | ICD-10-CM | POA: Diagnosis not present

## 2021-04-15 LAB — CBC
HCT: 41.4 % (ref 36.0–46.0)
Hemoglobin: 14 g/dL (ref 12.0–15.0)
MCH: 29.6 pg (ref 26.0–34.0)
MCHC: 33.8 g/dL (ref 30.0–36.0)
MCV: 87.5 fL (ref 80.0–100.0)
Platelets: 339 10*3/uL (ref 150–400)
RBC: 4.73 MIL/uL (ref 3.87–5.11)
RDW: 13 % (ref 11.5–15.5)
WBC: 12.5 10*3/uL — ABNORMAL HIGH (ref 4.0–10.5)
nRBC: 0 % (ref 0.0–0.2)

## 2021-04-15 LAB — COMPREHENSIVE METABOLIC PANEL
ALT: 42 U/L (ref 0–44)
AST: 28 U/L (ref 15–41)
Albumin: 3.5 g/dL (ref 3.5–5.0)
Alkaline Phosphatase: 52 U/L (ref 38–126)
Anion gap: 8 (ref 5–15)
BUN: 16 mg/dL (ref 8–23)
CO2: 27 mmol/L (ref 22–32)
Calcium: 8.4 mg/dL — ABNORMAL LOW (ref 8.9–10.3)
Chloride: 103 mmol/L (ref 98–111)
Creatinine, Ser: 0.89 mg/dL (ref 0.44–1.00)
GFR, Estimated: >60 mL/min (ref >60)
Glucose, Bld: 115 mg/dL — ABNORMAL HIGH (ref 70–99)
Potassium: 3.9 mmol/L (ref 3.5–5.1)
Sodium: 138 mmol/L (ref 135–145)
Total Bilirubin: 0.5 mg/dL (ref 0.3–1.2)
Total Protein: 6.9 g/dL (ref 6.5–8.1)

## 2021-04-15 LAB — LIPASE, BLOOD: Lipase: 31 U/L (ref 11–51)

## 2021-04-15 MED ORDER — ONDANSETRON HCL 4 MG/2ML IJ SOLN
4.0000 mg | Freq: Once | INTRAMUSCULAR | Status: AC
  Administered 2021-04-16: 4 mg via INTRAVENOUS
  Filled 2021-04-15: qty 2

## 2021-04-15 MED ORDER — SODIUM CHLORIDE 0.9 % IV BOLUS
1000.0000 mL | Freq: Once | INTRAVENOUS | Status: AC
Start: 2021-04-16 — End: 2021-04-16
  Administered 2021-04-16: 1000 mL via INTRAVENOUS

## 2021-04-15 MED ORDER — FENTANYL CITRATE PF 50 MCG/ML IJ SOSY
100.0000 ug | PREFILLED_SYRINGE | Freq: Once | INTRAMUSCULAR | Status: AC
  Administered 2021-04-16: 100 ug via INTRAVENOUS
  Filled 2021-04-15: qty 2

## 2021-04-15 NOTE — ED Triage Notes (Signed)
Pt c/o lower abd pain, n/v/d x 3 days-states she has hx of IBS-sx are worse-NAD-steady gait

## 2021-04-16 ENCOUNTER — Emergency Department (HOSPITAL_BASED_OUTPATIENT_CLINIC_OR_DEPARTMENT_OTHER): Payer: PPO

## 2021-04-16 ENCOUNTER — Other Ambulatory Visit: Payer: Self-pay

## 2021-04-16 ENCOUNTER — Emergency Department (HOSPITAL_BASED_OUTPATIENT_CLINIC_OR_DEPARTMENT_OTHER)
Admission: EM | Admit: 2021-04-16 | Discharge: 2021-04-16 | Disposition: A | Discharge status: Home / Self Care | Payer: PPO | Source: Home / Self Care | Attending: Emergency Medicine | Admitting: Emergency Medicine

## 2021-04-16 ENCOUNTER — Encounter (HOSPITAL_BASED_OUTPATIENT_CLINIC_OR_DEPARTMENT_OTHER): Payer: Self-pay | Admitting: *Deleted

## 2021-04-16 DIAGNOSIS — E860 Dehydration: Secondary | ICD-10-CM | POA: Diagnosis not present

## 2021-04-16 DIAGNOSIS — K5732 Diverticulitis of large intestine without perforation or abscess without bleeding: Secondary | ICD-10-CM | POA: Insufficient documentation

## 2021-04-16 DIAGNOSIS — K5792 Diverticulitis of intestine, part unspecified, without perforation or abscess without bleeding: Secondary | ICD-10-CM

## 2021-04-16 DIAGNOSIS — R112 Nausea with vomiting, unspecified: Secondary | ICD-10-CM

## 2021-04-16 DIAGNOSIS — N200 Calculus of kidney: Secondary | ICD-10-CM | POA: Diagnosis not present

## 2021-04-16 DIAGNOSIS — R109 Unspecified abdominal pain: Secondary | ICD-10-CM | POA: Diagnosis not present

## 2021-04-16 LAB — CBC WITH DIFFERENTIAL/PLATELET
Abs Immature Granulocytes: 0.06 10*3/uL (ref 0.00–0.07)
Basophils Absolute: 0.1 10*3/uL (ref 0.0–0.1)
Basophils Relative: 0 %
Eosinophils Absolute: 0.1 10*3/uL (ref 0.0–0.5)
Eosinophils Relative: 1 %
HCT: 38.8 % (ref 36.0–46.0)
Hemoglobin: 13.1 g/dL (ref 12.0–15.0)
Immature Granulocytes: 0 %
Lymphocytes Relative: 11 %
Lymphs Abs: 1.5 10*3/uL (ref 0.7–4.0)
MCH: 29.4 pg (ref 26.0–34.0)
MCHC: 33.8 g/dL (ref 30.0–36.0)
MCV: 87.2 fL (ref 80.0–100.0)
Monocytes Absolute: 0.8 10*3/uL (ref 0.1–1.0)
Monocytes Relative: 6 %
Neutro Abs: 10.9 10*3/uL — ABNORMAL HIGH (ref 1.7–7.7)
Neutrophils Relative %: 82 %
Platelets: 317 10*3/uL (ref 150–400)
RBC: 4.45 MIL/uL (ref 3.87–5.11)
RDW: 13.1 % (ref 11.5–15.5)
WBC: 13.4 10*3/uL — ABNORMAL HIGH (ref 4.0–10.5)
nRBC: 0 % (ref 0.0–0.2)

## 2021-04-16 LAB — BASIC METABOLIC PANEL
Anion gap: 9 (ref 5–15)
BUN: 11 mg/dL (ref 8–23)
CO2: 24 mmol/L (ref 22–32)
Calcium: 8.4 mg/dL — ABNORMAL LOW (ref 8.9–10.3)
Chloride: 103 mmol/L (ref 98–111)
Creatinine, Ser: 0.77 mg/dL (ref 0.44–1.00)
GFR, Estimated: >60 mL/min (ref >60)
Glucose, Bld: 123 mg/dL — ABNORMAL HIGH (ref 70–99)
Potassium: 4.2 mmol/L (ref 3.5–5.1)
Sodium: 136 mmol/L (ref 135–145)

## 2021-04-16 MED ORDER — ONDANSETRON HCL 4 MG/2ML IJ SOLN
4.0000 mg | Freq: Once | INTRAMUSCULAR | Status: AC
  Administered 2021-04-16: 4 mg via INTRAVENOUS
  Filled 2021-04-16: qty 2

## 2021-04-16 MED ORDER — METRONIDAZOLE 500 MG PO TABS
500.0000 mg | ORAL_TABLET | Freq: Once | ORAL | Status: AC
  Administered 2021-04-16: 500 mg via ORAL
  Filled 2021-04-16: qty 1

## 2021-04-16 MED ORDER — CIPROFLOXACIN HCL 500 MG PO TABS: 500.0000 mg | ORAL_TABLET | Freq: Two times a day (BID) | ORAL | 0 refills | Status: DC

## 2021-04-16 MED ORDER — HYDROCODONE-ACETAMINOPHEN 5-325 MG PO TABS: 1.0000 | ORAL_TABLET | Freq: Four times a day (QID) | ORAL | 0 refills | Status: DC | PRN

## 2021-04-16 MED ORDER — CIPROFLOXACIN HCL 500 MG PO TABS
500.0000 mg | ORAL_TABLET | Freq: Once | ORAL | Status: AC
  Administered 2021-04-16: 500 mg via ORAL
  Filled 2021-04-16: qty 1

## 2021-04-16 MED ORDER — LACTATED RINGERS IV BOLUS
1000.0000 mL | Freq: Once | INTRAVENOUS | Status: AC
  Administered 2021-04-16: 1000 mL via INTRAVENOUS

## 2021-04-16 MED ORDER — ACETAMINOPHEN 500 MG PO TABS
1000.0000 mg | ORAL_TABLET | Freq: Once | ORAL | Status: AC
  Administered 2021-04-16: 1000 mg via ORAL
  Filled 2021-04-16: qty 2

## 2021-04-16 MED ORDER — DIPHENHYDRAMINE HCL 50 MG/ML IJ SOLN
25.0000 mg | Freq: Once | INTRAMUSCULAR | Status: AC
  Administered 2021-04-16: 25 mg via INTRAVENOUS
  Filled 2021-04-16: qty 1

## 2021-04-16 MED ORDER — METRONIDAZOLE 500 MG PO TABS: 500.0000 mg | ORAL_TABLET | Freq: Two times a day (BID) | ORAL | 0 refills | Status: DC

## 2021-04-16 MED ORDER — SODIUM CHLORIDE 0.9 % IV BOLUS
1000.0000 mL | Freq: Once | INTRAVENOUS | Status: AC
  Administered 2021-04-16: 1000 mL via INTRAVENOUS

## 2021-04-16 MED ORDER — METOCLOPRAMIDE HCL 10 MG PO TABS: 10.0000 mg | ORAL_TABLET | Freq: Four times a day (QID) | ORAL | 0 refills | Status: DC

## 2021-04-16 MED ORDER — IOHEXOL 300 MG/ML  SOLN
100.0000 mL | Freq: Once | INTRAMUSCULAR | Status: AC | PRN
  Administered 2021-04-16: 100 mL via INTRAVENOUS

## 2021-04-16 NOTE — ED Notes (Signed)
Dr. Plunkett at bedside.  

## 2021-04-16 NOTE — ED Notes (Signed)
Pt reports she is feeling better. Pt was given a cup of ice water per fluid challenge.

## 2021-04-16 NOTE — ED Notes (Signed)
Patient transported to CT 

## 2021-04-16 NOTE — ED Triage Notes (Signed)
C/o Dx diverticulitis x 1 day ago , n/v today pt states no meds for n/v

## 2021-04-16 NOTE — ED Provider Notes (Signed)
[facility] Provider Note   CSN: [id number] Arrival date & time: 04/16/21  1930     History  Chief Complaint  Patient presents with   Vomiting    Amy Glenn is a 71 y.o. female.  Patient is a 71 year old female with a history of GERD, recurrent diverticulitis, bronchitis who was seen in the emergency room yesterday and found to have diverticulitis without complicating features and leukocytosis of 12.  Patient was started on Cipro and Flagyl.  She has taken 2 doses of antibiotic but reports that this morning when she woke up she was feeling nauseated and she has vomited throughout the day.  She has had approximately 5 episodes of emesis and reports feeling a little bit confused as well.  She has had no appetite but reports that the pain in her abdomen seems to be a little bit improved.  She is unaware if she has been running a fever but reports just feels generally unwell.  She has had history of recurrent diverticulitis and reports planning on seeing her surgeon next week for discussion of possible partial colectomy.  She denies cough shortness of breath.       Home Medications Prior to Admission medications   Medication Sig Start Date End Date Taking? Authorizing Provider  amitriptyline (ELAVIL) 10 MG tablet Take 10 mg by mouth at bedtime.    [provider]  ciprofloxacin (CIPRO) 500 MG tablet Take 1 tablet (500 mg total) by mouth 2 (two) times daily. 04/16/21   Ripley Fraise, MD  dorzolamide-timolol (COSOPT) 22.3-6.8 MG/ML ophthalmic solution 1 drop 2 (two) times daily. 01/11/20   [provider]  EUTHYROX 50 MCG tablet Take 50 mcg by mouth every morning. 11/25/19   [provider]  fish oil-omega-3 fatty acids 1000 MG capsule Take 2 g by mouth daily.    [provider]  HYDROcodone-acetaminophen (NORCO/VICODIN) 5-325 MG tablet Take 1 tablet by mouth every 6 (six) hours as needed for severe pain. 04/16/21    Ripley Fraise, MD  lansoprazole (PREVACID) 15 MG capsule Take by mouth.    [provider]  meloxicam (MOBIC) 15 MG tablet Take by mouth.    [provider]  meloxicam (MOBIC) 7.5 MG tablet Take 7.5 mg by mouth as needed. 02/29/20   [provider]  metroNIDAZOLE (FLAGYL) 500 MG tablet Take 1 tablet (500 mg total) by mouth 2 (two) times daily. 04/16/21   Ripley Fraise, MD  Multiple Vitamins-Minerals (MULTIVITAMIN WITH MINERALS) tablet Take 1 tablet by mouth daily.    [provider]  PARoxetine (PAXIL) 20 MG tablet Take 20 mg by mouth every morning.    [provider]  rosuvastatin (CRESTOR) 10 MG tablet Take 1 tablet (10 mg total) by mouth daily. 09/12/20   Jettie Booze, MD  zolpidem (AMBIEN) 5 MG tablet Take by mouth.    [provider]      Allergies    Patient has no known allergies.    Review of Systems   Review of Systems  Physical Exam Updated Vital Signs BP 128/83 (BP Location: Left Arm)    Pulse (!) 106    Temp 100.2 F (37.9 C) (Oral)    Resp 16    Ht 5' 7.5" (1.715 m)    Wt 83.9 kg    SpO2 96%    BMI 28.55 kg/m  Physical Exam Vitals and nursing note reviewed.  Constitutional:      General: She is not in  acute distress.    Appearance: She is well-developed.  HENT:     Head: Normocephalic and atraumatic.     Mouth/Throat:     Mouth: Mucous membranes are dry.  Eyes:     Pupils: Pupils are equal, round, and reactive to light.  Cardiovascular:     Rate and Rhythm: Regular rhythm. Tachycardia present.     Heart sounds: Normal heart sounds. No murmur heard.   No friction rub.  Pulmonary:     Effort: Pulmonary effort is normal.     Breath sounds: Normal breath sounds. No wheezing or rales.  Abdominal:     General: Bowel sounds are normal. There is no distension.     Palpations: Abdomen is soft.     Tenderness: There is abdominal tenderness in the left lower quadrant. There is no guarding or rebound.   Musculoskeletal:        General: No tenderness. Normal range of motion.     Right lower leg: No edema.     Left lower leg: No edema.     Comments: No edema  Skin:    General: Skin is warm and dry.     Findings: No rash.  Neurological:     Mental Status: She is alert and oriented to person, place, and time. Mental status is at baseline.     Cranial Nerves: No cranial nerve deficit.     Sensory: No sensory deficit.     Motor: No weakness.  Psychiatric:        Mood and Affect: Mood normal.        Behavior: Behavior normal.    ED Results / Procedures / Treatments   Labs (all labs ordered are listed, but only abnormal results are displayed) Labs Reviewed  BASIC METABOLIC PANEL  CBC WITH DIFFERENTIAL/PLATELET    EKG None  Radiology CT ABDOMEN PELVIS W CONTRAST  Result Date: 04/16/2021 CLINICAL DATA:  Lower abdominal pain, nausea/vomiting/diarrhea. Prior cholecystectomy and appendectomy. EXAM: CT ABDOMEN AND PELVIS WITH CONTRAST TECHNIQUE: Multidetector CT imaging of the abdomen and pelvis was performed using the standard protocol following bolus administration of intravenous contrast. RADIATION DOSE REDUCTION: This exam was performed according to the departmental dose-optimization program which includes automated exposure control, adjustment of the mA and/or kV according to patient size and/or use of iterative reconstruction technique. CONTRAST:  124mL OMNIPAQUE IOHEXOL 300 MG/ML  SOLN COMPARISON:  02/14/2021 FINDINGS: Lower chest: Lung bases are essentially clear, with minimal subpleural atelectasis. Hepatobiliary: Liver is within normal limits. Status post cholecystectomy. No intrahepatic or extrahepatic ductal dilatation. Pancreas: Within normal limits. Spleen: Within normal limits. Adrenals/Urinary Tract: Adrenal glands are within normal limits. 5 mm nonobstructing left upper pole renal calculus (series 2/image 28). Right kidney is within normal limits. No hydronephrosis. Bladder is  within normal limits. Stomach/Bowel: Stomach is notable for a tiny hiatal hernia. No evidence of bowel obstruction. Status post appendectomy with normal appendiceal stump (series 2/image 63). Extensive left colonic diverticulosis with rectosigmoid diverticulitis (series 2/image 31). Trace pericolonic fluid without drainable fluid/abscess. No free air to suggest macroscopic perforation. Vascular/Lymphatic: No evidence of abdominal aortic aneurysm. Atherosclerotic calcifications of the abdominal aorta and branch vessels. No suspicious abdominopelvic lymphadenopathy. Reproductive: Uterus is within normal limits. Bilateral ovaries are within normal limits. Other: Trace pelvic ascites. Musculoskeletal: Degenerative changes of the visualized thoracolumbar spine. IMPRESSION: Rectosigmoid diverticulitis, without evidence of complication. 5 mm nonobstructing left upper pole renal calculus. No hydronephrosis. Electronically Signed   By: Julian Hy M.D.   On: 04/16/2021  00:38    Procedures Procedures    Medications Ordered in ED Medications  acetaminophen (TYLENOL) tablet 1,000 mg (has no administration in time range)  ondansetron (ZOFRAN) injection 4 mg (has no administration in time range)  lactated ringers bolus 1,000 mL (has no administration in time range)    ED Course/ Medical Decision Making/ A&P                           Medical Decision Making Amount and/or Complexity of Data Reviewed External Data Reviewed: notes. Labs: ordered. Decision-making details documented in ED Course.  Risk OTC drugs. Prescription drug management.   Patient is a 71 year old female returning today to the emergency room due to persistent vomiting and some mild confusion.  Patient has had 5 episodes of emesis throughout the day and has not been able to hold anything down.  She reported feeling confused earlier but is mentating normally here.  Patient is talked to the touch and temperature of 100.2.  She is  mildly tachycardic but has a normal blood pressure.  Patient's CAT scan from yesterday showed that she had rectosigmoid diverticulitis without complicating features.  She has some mild tenderness in her lower abdomen but no peritoneal signs and low suspicion for abrupt perforation.  She had no abscess present yesterday.  Patient reports this is occurred sometimes when her electrolytes get out of whack and also nausea may be be coming from the Cipro and Flagyl.  Patient given IV fluids and antiemetic.  We will recheck labs and reevaluate.  Patient given Tylenol for fever.  11:14 PM I independently interpreted patient's labs and leukocytosis is the same, BMP without new electrolyte abnormalities and normal creatinine.  On repeat evaluation after fluids and antiemetics patient reports she feels much better.  Blood pressures have been slightly soft but normal maps.  She is now drinking fluids without any difficulty and is requesting to go home.  She has follow-up with her surgeon.  All the findings were discussed with the patient.  She has no further questions at this time.  No social determinants affecting her discharge.  Considered admission but at this time patient is tolerating p.o.'s.  Is improved and does not meet admission criteria.  She was given strict return precautions.        Final Clinical Impression(s) / ED Diagnoses Final diagnoses:  Nausea and vomiting, unspecified vomiting type  Dehydration  Diverticulitis    Rx / DC Orders ED Discharge Orders     None         Blanchie Dessert, MD 04/16/21 2315

## 2021-04-16 NOTE — Discharge Instructions (Signed)
Your electrolytes today look fine you did have a fever which may have added into some of the symptoms you are having.  Continue taking the antibiotics.  Her prescription for nausea medication was sent to your pharmacy.  If your symptoms get worse again or the pain gets worse and you continue to have fever you should return for evaluation.

## 2021-04-16 NOTE — ED Provider Notes (Signed)
[facility] Provider Note   CSN: [id number] Arrival date & time: 04/15/21  2158     History  Chief Complaint  Patient presents with   Abdominal Pain    Amy Glenn is a 71 y.o. female.  The history is provided by the patient.  Abdominal Pain Pain quality: cramping   Pain quality: not aching   Pain radiates to:  Does not radiate Pain severity:  Moderate Onset quality:  Gradual Duration:  3 days Timing:  Constant Progression:  Worsening Chronicity:  New Relieved by:  Nothing Worsened by:  Palpation and movement Associated symptoms: chills   Associated symptoms: no chest pain and no dysuria   Patient with history of IBS presents with abdominal pain.  She reports about 3 days ago she had episodes of nonbloody emesis and diarrhea.  She reports that began to improve, but since has been having lower abdominal pain.  She reports this is severe and worse than what she typically has. She has had multiple abdominal surgeries in the past including appendectomy and cholecystectomy   Past Medical History:  Diagnosis Date   Arthritis    Chronic bronchitis ([place])    Depression    GERD (gastroesophageal reflux disease)    Thyroid disease    Wears hearing aid    both ears    Home Medications Prior to Admission medications   Medication Sig Start Date End Date Taking? Authorizing Provider  amitriptyline (ELAVIL) 10 MG tablet Take 10 mg by mouth at bedtime.    [provider]  dorzolamide-timolol (COSOPT) 22.3-6.8 MG/ML ophthalmic solution 1 drop 2 (two) times daily. 01/11/20   [provider]  EUTHYROX 50 MCG tablet Take 50 mcg by mouth every morning. 11/25/19   [provider]  fish oil-omega-3 fatty acids 1000 MG capsule Take 2 g by mouth daily.    [provider]  lansoprazole (PREVACID) 15 MG capsule Take by mouth.    [provider]  meloxicam (MOBIC) 15 MG tablet Take by mouth.    [provider]  meloxicam (MOBIC) 7.5 MG tablet Take 7.5 mg by mouth as needed. 02/29/20   [provider]  Multiple Vitamins-Minerals (MULTIVITAMIN WITH MINERALS) tablet Take 1 tablet by mouth daily.    [provider]  PARoxetine (PAXIL) 20 MG tablet Take 20 mg by mouth every morning.    [provider]  rosuvastatin (CRESTOR) 10 MG tablet Take 1 tablet (10 mg total) by mouth daily. 09/12/20   Jettie Booze, MD  zolpidem (AMBIEN) 5 MG tablet Take by mouth.    [provider]      Allergies    Patient has no known allergies.    Review of Systems   Review of Systems  Constitutional:  Positive for chills.  Cardiovascular:  Negative for chest pain.  Gastrointestinal:  Positive for abdominal pain. Negative for blood in stool.  Genitourinary:  Negative for dysuria.  All other systems reviewed and are negative.  Physical Exam Updated Vital Signs BP 129/73    Pulse (!) 110    Temp 99.6 F (37.6 C) (Oral)    Resp 18    Ht 1.702 m (5\' 7" )    Wt 83.9 kg    SpO2 95%    BMI 28.98 kg/m  Physical Exam CONSTITUTIONAL: Well developed/well nourished, uncomfortable appearing HEAD: Normocephalic/atraumatic EYES: EOMI/PERRL, no icterus ENMT: Mask in place NECK: supple no meningeal signs SPINE/BACK:entire spine nontender CV: S1/S2 noted, tachycardic LUNGS: Lungs  are clear to auscultation bilaterally, no apparent distress ABDOMEN: soft, mild diffuse lower abdominal tenderness, no rebound or guarding, bowel sounds noted throughout abdomen GU:no cva tenderness NEURO: Pt is awake/alert/appropriate, moves all extremitiesx4.  No facial droop.   EXTREMITIES: pulses normal/equal, full ROM SKIN: warm, color normal PSYCH: no abnormalities of mood noted, alert and oriented to situation  ED Results / Procedures / Treatments   Labs (all labs ordered are listed, but only abnormal results are displayed) Labs Reviewed  COMPREHENSIVE METABOLIC PANEL - Abnormal;  Notable for the following components:      Result Value   Glucose, Bld 115 (*)    Calcium 8.4 (*)    All other components within normal limits  CBC - Abnormal; Notable for the following components:   WBC 12.5 (*)    All other components within normal limits  LIPASE, BLOOD    EKG None  Radiology CT ABDOMEN PELVIS W CONTRAST  Result Date: 04/16/2021 CLINICAL DATA:  Lower abdominal pain, nausea/vomiting/diarrhea. Prior cholecystectomy and appendectomy. EXAM: CT ABDOMEN AND PELVIS WITH CONTRAST TECHNIQUE: Multidetector CT imaging of the abdomen and pelvis was performed using the standard protocol following bolus administration of intravenous contrast. RADIATION DOSE REDUCTION: This exam was performed according to the departmental dose-optimization program which includes automated exposure control, adjustment of the mA and/or kV according to patient size and/or use of iterative reconstruction technique. CONTRAST:  149mL OMNIPAQUE IOHEXOL 300 MG/ML  SOLN COMPARISON:  02/14/2021 FINDINGS: Lower chest: Lung bases are essentially clear, with minimal subpleural atelectasis. Hepatobiliary: Liver is within normal limits. Status post cholecystectomy. No intrahepatic or extrahepatic ductal dilatation. Pancreas: Within normal limits. Spleen: Within normal limits. Adrenals/Urinary Tract: Adrenal glands are within normal limits. 5 mm nonobstructing left upper pole renal calculus (series 2/image 28). Right kidney is within normal limits. No hydronephrosis. Bladder is within normal limits. Stomach/Bowel: Stomach is notable for a tiny hiatal hernia. No evidence of bowel obstruction. Status post appendectomy with normal appendiceal stump (series 2/image 63). Extensive left colonic diverticulosis with rectosigmoid diverticulitis (series 2/image 31). Trace pericolonic fluid without drainable fluid/abscess. No free air to suggest macroscopic perforation. Vascular/Lymphatic: No evidence of abdominal aortic aneurysm.  Atherosclerotic calcifications of the abdominal aorta and branch vessels. No suspicious abdominopelvic lymphadenopathy. Reproductive: Uterus is within normal limits. Bilateral ovaries are within normal limits. Other: Trace pelvic ascites. Musculoskeletal: Degenerative changes of the visualized thoracolumbar spine. IMPRESSION: Rectosigmoid diverticulitis, without evidence of complication. 5 mm nonobstructing left upper pole renal calculus. No hydronephrosis. Electronically Signed   By: Julian Hy M.D.   On: 04/16/2021 00:38    Procedures Procedures    Medications Ordered in ED Medications  sodium chloride 0.9 % bolus 1,000 mL (0 mLs Intravenous Stopped 04/16/21 0148)  ondansetron (ZOFRAN) injection 4 mg (4 mg Intravenous Given 04/16/21 0002)  fentaNYL (SUBLIMAZE) injection 100 mcg (100 mcg Intravenous Given 04/16/21 0002)  iohexol (OMNIPAQUE) 300 MG/ML solution 100 mL (100 mLs Intravenous Contrast Given 04/16/21 0014)  diphenhydrAMINE (BENADRYL) injection 25 mg (25 mg Intravenous Given 04/16/21 0025)  sodium chloride 0.9 % bolus 1,000 mL (0 mLs Intravenous Stopped 04/16/21 0310)  metroNIDAZOLE (FLAGYL) tablet 500 mg (500 mg Oral Given 04/16/21 0117)  ciprofloxacin (CIPRO) tablet 500 mg (500 mg Oral Given 04/16/21 0117)    ED Course/ Medical Decision Making/ A&P Clinical Course as of 04/16/21 0423  Wed Apr 16, 2021  0103 Patient found to have diverticulitis without complicating features.  I personally reviewed imaging.  Patient is had this previously is already seen  general surgery.  We will start oral antibiotics.  Patient was also given Benadryl she reports she does have itching at times after CT contrast, but no signs of any allergic reaction [DW]  0237 WBC(!): 12.5 Leukocytosis noted [DW]  0237 Patient resting comfortably, continue to monitor but anticipate discharge [DW]  0422 Patient has been resting comfortably and feels improved.  She will be discharged home with general surgery follow-up.  We  discussed strict return precaution [DW]    Clinical Course User Index [DW] Ripley Fraise, MD                           Medical Decision Making Amount and/or Complexity of Data Reviewed Labs: ordered. Decision-making details documented in ED Course. Radiology: ordered.  Risk Prescription drug management.   This patient presents to the ED for concern of abdominal pain, this involves an extensive number of treatment options, and is a complaint that carries with it a high risk of complications and morbidity.  The differential diagnosis includes diverticulitis, perforated bowel, bowel obstruction, pancreatitis  Comorbidities that complicate the patient evaluation: Patients presentation is complicated by their history of irritable bowel syndrome  Social Determinants of Health: Patients age increases the complexity of managing their presentation  Additional history obtained:  Records reviewed consultant notes reviewed  Lab Tests: I Ordered, and personally interpreted labs.  The pertinent results include: Leukocytosis  Imaging Studies ordered: I ordered imaging studies including CT scan abdomen/pelvis I independently visualized and interpreted imaging which showed diverticulitis I agree with the radiologist interpretation  Cardiac Monitoring: The patient was maintained on a cardiac monitor.  I personally viewed and interpreted the cardiac monitor which showed an underlying rhythm of:  sinus tachycardia  Medicines ordered and prescription drug management: I ordered medication including IV fentanyl for pain Reevaluation of the patient after these medicines showed that the patient    improved   Critical Interventions:       IV fluids and antibiotics   Reevaluation: After the interventions noted above, I reevaluated the patient and found that they have :improved  Complexity of problems addressed: Patients presentation is most consistent with  acute complicated  illness/injury requiring diagnostic workup      Disposition: After consideration of the diagnostic results and the patients response to treatment,  I feel that the patent would benefit from discharge .            Final Clinical Impression(s) / ED Diagnoses Final diagnoses:  Diverticulitis    Rx / DC Orders ED Discharge Orders          Ordered    HYDROcodone-acetaminophen (NORCO/VICODIN) 5-325 MG tablet  Every 6 hours PRN        04/16/21 0423    ciprofloxacin (CIPRO) 500 MG tablet  2 times daily        04/16/21 0423    metroNIDAZOLE (FLAGYL) 500 MG tablet  2 times daily        04/16/21 0423              Ripley Fraise, MD 04/16/21 [phone]

## 2021-04-16 NOTE — ED Notes (Signed)
Rx x 1 given  Written and verbal inst to pt  Verbalized an understanding  To home with family  

## 2021-04-18 DIAGNOSIS — G4700 Insomnia, unspecified: Secondary | ICD-10-CM | POA: Diagnosis not present

## 2021-04-18 DIAGNOSIS — E041 Nontoxic single thyroid nodule: Secondary | ICD-10-CM | POA: Diagnosis not present

## 2021-04-23 ENCOUNTER — Ambulatory Visit: Payer: Self-pay | Admitting: Surgery

## 2021-04-23 DIAGNOSIS — R1032 Left lower quadrant pain: Secondary | ICD-10-CM | POA: Diagnosis not present

## 2021-04-23 DIAGNOSIS — K581 Irritable bowel syndrome with constipation: Secondary | ICD-10-CM | POA: Diagnosis not present

## 2021-04-23 DIAGNOSIS — K5732 Diverticulitis of large intestine without perforation or abscess without bleeding: Secondary | ICD-10-CM | POA: Diagnosis not present

## 2021-04-29 ENCOUNTER — Other Ambulatory Visit: Payer: Self-pay | Admitting: Family Medicine

## 2021-04-29 DIAGNOSIS — H527 Unspecified disorder of refraction: Secondary | ICD-10-CM | POA: Diagnosis not present

## 2021-04-29 DIAGNOSIS — H04123 Dry eye syndrome of bilateral lacrimal glands: Secondary | ICD-10-CM | POA: Diagnosis not present

## 2021-04-29 DIAGNOSIS — H02834 Dermatochalasis of left upper eyelid: Secondary | ICD-10-CM | POA: Diagnosis not present

## 2021-04-29 DIAGNOSIS — H26493 Other secondary cataract, bilateral: Secondary | ICD-10-CM | POA: Diagnosis not present

## 2021-04-29 DIAGNOSIS — H02831 Dermatochalasis of right upper eyelid: Secondary | ICD-10-CM | POA: Diagnosis not present

## 2021-04-29 DIAGNOSIS — H338 Other retinal detachments: Secondary | ICD-10-CM | POA: Diagnosis not present

## 2021-04-29 DIAGNOSIS — Z961 Presence of intraocular lens: Secondary | ICD-10-CM | POA: Diagnosis not present

## 2021-04-29 DIAGNOSIS — H40003 Preglaucoma, unspecified, bilateral: Secondary | ICD-10-CM | POA: Diagnosis not present

## 2021-04-29 DIAGNOSIS — E041 Nontoxic single thyroid nodule: Secondary | ICD-10-CM

## 2021-04-29 DIAGNOSIS — H43813 Vitreous degeneration, bilateral: Secondary | ICD-10-CM | POA: Diagnosis not present

## 2021-05-02 ENCOUNTER — Other Ambulatory Visit: Payer: PPO

## 2021-05-05 DIAGNOSIS — Z124 Encounter for screening for malignant neoplasm of cervix: Secondary | ICD-10-CM | POA: Diagnosis not present

## 2021-05-05 DIAGNOSIS — Z6829 Body mass index (BMI) 29.0-29.9, adult: Secondary | ICD-10-CM | POA: Diagnosis not present

## 2021-05-05 DIAGNOSIS — Z1151 Encounter for screening for human papillomavirus (HPV): Secondary | ICD-10-CM | POA: Diagnosis not present

## 2021-05-09 ENCOUNTER — Other Ambulatory Visit: Payer: Self-pay | Admitting: Urology

## 2021-05-09 DIAGNOSIS — M7989 Other specified soft tissue disorders: Secondary | ICD-10-CM | POA: Diagnosis not present

## 2021-05-09 DIAGNOSIS — I83891 Varicose veins of right lower extremities with other complications: Secondary | ICD-10-CM | POA: Diagnosis not present

## 2021-05-09 DIAGNOSIS — I83811 Varicose veins of right lower extremities with pain: Secondary | ICD-10-CM | POA: Diagnosis not present

## 2021-05-12 DIAGNOSIS — Z09 Encounter for follow-up examination after completed treatment for conditions other than malignant neoplasm: Secondary | ICD-10-CM | POA: Diagnosis not present

## 2021-05-12 DIAGNOSIS — I83892 Varicose veins of left lower extremities with other complications: Secondary | ICD-10-CM | POA: Diagnosis not present

## 2021-05-12 DIAGNOSIS — I872 Venous insufficiency (chronic) (peripheral): Secondary | ICD-10-CM | POA: Diagnosis not present

## 2021-05-13 ENCOUNTER — Ambulatory Visit
Admission: RE | Admit: 2021-05-13 | Discharge: 2021-05-13 | Disposition: A | Discharge status: Home / Self Care | Payer: PPO | Source: Ambulatory Visit | Attending: Family Medicine | Admitting: Family Medicine

## 2021-05-13 DIAGNOSIS — E041 Nontoxic single thyroid nodule: Secondary | ICD-10-CM | POA: Diagnosis not present

## 2021-05-14 DIAGNOSIS — I87392 Chronic venous hypertension (idiopathic) with other complications of left lower extremity: Secondary | ICD-10-CM | POA: Diagnosis not present

## 2021-05-14 DIAGNOSIS — Z09 Encounter for follow-up examination after completed treatment for conditions other than malignant neoplasm: Secondary | ICD-10-CM | POA: Diagnosis not present

## 2021-05-14 DIAGNOSIS — R1011 Right upper quadrant pain: Secondary | ICD-10-CM | POA: Diagnosis not present

## 2021-05-15 DIAGNOSIS — H01134 Eczematous dermatitis of left upper eyelid: Secondary | ICD-10-CM | POA: Diagnosis not present

## 2021-05-15 DIAGNOSIS — H01131 Eczematous dermatitis of right upper eyelid: Secondary | ICD-10-CM | POA: Diagnosis not present

## 2021-05-15 DIAGNOSIS — C44612 Basal cell carcinoma of skin of right upper limb, including shoulder: Secondary | ICD-10-CM | POA: Diagnosis not present

## 2021-05-15 DIAGNOSIS — D485 Neoplasm of uncertain behavior of skin: Secondary | ICD-10-CM | POA: Diagnosis not present

## 2021-05-15 DIAGNOSIS — Z23 Encounter for immunization: Secondary | ICD-10-CM | POA: Diagnosis not present

## 2021-05-16 ENCOUNTER — Other Ambulatory Visit: Payer: Self-pay

## 2021-05-16 DIAGNOSIS — N62 Hypertrophy of breast: Secondary | ICD-10-CM | POA: Diagnosis not present

## 2021-05-16 DIAGNOSIS — N6315 Unspecified lump in the right breast, overlapping quadrants: Secondary | ICD-10-CM | POA: Diagnosis not present

## 2021-05-20 ENCOUNTER — Ambulatory Visit: Payer: Self-pay | Admitting: Surgery

## 2021-05-20 DIAGNOSIS — R739 Hyperglycemia, unspecified: Secondary | ICD-10-CM

## 2021-05-20 NOTE — Progress Notes (Signed)
Sent message, via epic in basket, requesting orders in epic from surgeon.  

## 2021-05-21 ENCOUNTER — Other Ambulatory Visit (HOSPITAL_COMMUNITY): Payer: Self-pay

## 2021-05-22 NOTE — Patient Instructions (Addendum)
DUE TO COVID-19 ONLY ONE VISITOR  (aged 70 and older)  IS ALLOWED TO COME WITH YOU AND STAY IN THE WAITING ROOM ONLY DURING PRE OP AND PROCEDURE.   **NO VISITORS ARE ALLOWED IN THE SHORT STAY AREA OR RECOVERY ROOM!!**  IF YOU WILL BE ADMITTED INTO THE HOSPITAL YOU ARE ALLOWED ONLY TWO SUPPORT PEOPLE DURING VISITATION HOURS ONLY (7 AM -8PM)   The support person(s) must pass our screening, gel in and out, and wear a mask at all times, including in the patients room. Patients must also wear a mask when staff or their support person are in the room. Visitors GUEST BADGE MUST BE WORN VISIBLY  One adult visitor may remain with you overnight and MUST be in the room by 8 P.M.     COVID SWAB TESTING MUST BE COMPLETED ON:  05/28/21 at 12:15 pm    (*ARRIVE AT YOUR APPOINTMENT TIME STAFF IS NOT HERE BEFORE 8AM!!!*)    Site: [facility] [address] [place] Enter: Main Entrance have a seat in the waiting area to the right of main entrance (DO NOT [place]!!!!!) Dial: [phone] to alert staff you have arrived  You are not required to quarantine, however you are required to wear a well-fitted mask when you are out and around people not in your household.  Hand Hygiene often Do NOT share personal items Notify your provider if you are in close contact with someone who has COVID or you develop fever 100.4 or greater, new onset of sneezing, cough, sore throat, shortness of breath or body aches.  [place], [address], must go inside of the hospital, NOT A DRIVE THRU!  (Must self quarantine after testing. Follow instructions on handout.)         Your procedure is scheduled on: 05/30/21   Report to [facility] Main Entrance    Report to admitting at  10:45 AM   Call this number if you have problems the morning of surgery [phone]   Do not eat food :After Midnight.   After Midnight you  may have the following liquids until __10:00____ AM DAY OF SURGERY  Water Black Coffee (sugar ok, NO MILK/CREAM OR CREAMERS)  Tea (sugar ok, NO MILK/CREAM OR CREAMERS) regular and decaf                             Plain Jell-O (NO RED)                                           Fruit ices (not with fruit pulp, NO RED)                                     Popsicles (NO RED)                                                                  Juice: apple, WHITE grape, WHITE cranberry Sports drinks like Gatorade (NO  RED) Clear broth(vegetable,chicken,beef)               Drink 2 Ensure/G2 drinks AT 10:00 PM the night before surgery.        The day of surgery:  Drink ONE (1) Pre-Surgery Clear Ensure  at  9:45 AM the morning of surgery. Drink in one sitting. Do not sip.  This drink was given to you during your hospital  pre-op appointment visit. Nothing else to drink after completing the  Pre-Surgery Clear Ensure AT 10:00 am          If you have questions, please contact your surgeons office.   FOLLOW BOWEL PREP AND ANY ADDITIONAL PRE OP INSTRUCTIONS YOU RECEIVED FROM YOUR SURGEON'S OFFICE!!!  Drink plenty of fluids on prep day to prevent dehydration    Oral Hygiene is also important to reduce your risk of infection.                                    Remember - BRUSH YOUR TEETH THE MORNING OF SURGERY WITH YOUR REGULAR TOOTHPASTE   Do NOT smoke after Midnight   Take these medicines the morning of surgery with A SIP OF WATER: PAXIL, EUTHYROX, LANSOPRAZOLE   Bring CPAP mask and tubing day of surgery.                              You may not have any metal on your body including hair pins, jewelry, and body piercing             Do not wear make-up, lotions, powders, perfumes/cologne, or deodorant  Do not wear nail polish including gel and S&S, artificial/acrylic nails, or any other type of covering on natural nails including finger and toenails. If you have artificial nails, gel  coating, etc. that needs to be removed by a nail salon please have this removed prior to surgery or surgery may need to be canceled/ delayed if the surgeon/ anesthesia feels like they are unable to be safely monitored.   Do not shave  48 hours prior to surgery.     Do not bring valuables to the hospital. [place].   Contacts, dentures or bridgework may not be worn into surgery.   Bring small overnight bag day of surgery.    Patients discharged on the day of surgery will not be allowed to drive home.  Someone NEEDS to stay with you for the first 24 hours after anesthesia.   Special Instructions: Bring a copy of your healthcare power of attorney and living will documents the day of surgery if you haven't scanned them before.              Please read over the following fact sheets you were given: IF YOU HAVE QUESTIONS ABOUT YOUR PRE-OP INSTRUCTIONS PLEASE CALL [phone]     [facility] - Preparing for Surgery Before surgery, you can play an important role.  Because skin is not sterile, your skin needs to be as free of germs as possible.  You can reduce the number of germs on your skin by washing with CHG (chlorahexidine gluconate) soap before surgery.  CHG is an antiseptic cleaner which kills germs and bonds with the skin to continue killing germs even after  washing. Please DO NOT use if you have an allergy to CHG or antibacterial soaps.  If your skin becomes reddened/irritated stop using the CHG and inform your nurse when you arrive at Short Stay. Do not shave (including legs and underarms) for at least 48 hours prior to the first CHG shower.   Please follow these instructions carefully:  1.  Shower with CHG Soap the night before surgery and the  morning of Surgery.  2.  If you choose to wash your hair, wash your hair first as usual with your  normal  shampoo.  3.  After you shampoo, rinse your hair and body thoroughly to remove the   shampoo.                            4.  Use CHG as you would any other liquid soap.  You can apply chg directly  to the skin and wash                       Gently with a scrungie or clean washcloth.  5.  Apply the CHG Soap to your body ONLY FROM THE NECK DOWN.   Do not use on face/ open                           Wound or open sores. Avoid contact with eyes, ears mouth and genitals (private parts).                       Wash face,  Genitals (private parts) with your normal soap.             6.  Wash thoroughly, paying special attention to the area where your surgery  will be performed.  7.  Thoroughly rinse your body with warm water from the neck down.  8.  DO NOT shower/wash with your normal soap after using and rinsing off  the CHG Soap.                9.  Pat yourself dry with a clean towel.            10.  Wear clean pajamas.            11.  Place clean sheets on your bed the night of your first shower and do not  sleep with pets. Day of Surgery : Do not apply any lotions/deodorants the morning of surgery.  Please wear clean clothes to the hospital/surgery center.  FAILURE TO FOLLOW THESE INSTRUCTIONS MAY RESULT IN THE CANCELLATION OF YOUR SURGERY   ________________________________________________________________________   Incentive Spirometer  An incentive spirometer is a tool that can help keep your lungs clear and active. This tool measures how well you are filling your lungs with each breath. Taking long deep breaths may help reverse or decrease the chance of developing breathing (pulmonary) problems (especially infection) following: A long period of time when you are unable to move or be active. BEFORE THE PROCEDURE  If the spirometer includes an indicator to show your best effort, your nurse or respiratory therapist will set it to a desired goal. If possible, sit up straight or lean slightly forward. Try not to slouch. Hold the incentive spirometer in an upright  position. INSTRUCTIONS FOR USE  Sit on the edge of your bed if possible, or sit up as far as you can  in bed or on a chair. Hold the incentive spirometer in an upright position. Breathe out normally. Place the mouthpiece in your mouth and seal your lips tightly around it. Breathe in slowly and as deeply as possible, raising the piston or the ball toward the top of the column. Hold your breath for 3-5 seconds or for as long as possible. Allow the piston or ball to fall to the bottom of the column. Remove the mouthpiece from your mouth and breathe out normally. Rest for a few seconds and repeat Steps 1 through 7 at least 10 times every 1-2 hours when you are awake. Take your time and take a few normal breaths between deep breaths. The spirometer may include an indicator to show your best effort. Use the indicator as a goal to work toward during each repetition. After each set of 10 deep breaths, practice coughing to be sure your lungs are clear. If you have an incision (the cut made at the time of surgery), support your incision when coughing by placing a pillow or rolled up towels firmly against it. Once you are able to get out of bed, walk around indoors and cough well. You may stop using the incentive spirometer when instructed by your caregiver.  RISKS AND COMPLICATIONS Take your time so you do not get dizzy or light-headed. If you are in pain, you may need to take or ask for pain medication before doing incentive spirometry. It is harder to take a deep breath if you are having pain. AFTER USE Rest and breathe slowly and easily. It can be helpful to keep track of a log of your progress. Your caregiver can provide you with a simple table to help with this. If you are using the spirometer at home, follow these instructions: [place] IF:  You are having difficultly using the spirometer. You have trouble using the spirometer as often as instructed. Your pain medication is not giving  enough relief while using the spirometer. You develop fever of 100.5 F (38.1 C) or higher. SEEK IMMEDIATE MEDICAL CARE IF:  You cough up bloody sputum that had not been present before. You develop fever of 102 F (38.9 C) or greater. You develop worsening pain at or near the incision site. MAKE SURE YOU:  Understand these instructions. Will watch your condition. Will get help right away if you are not doing well or get worse. Document Released: 07/13/2006 Document Revised: 05/25/2011 Document Reviewed: 09/13/2006 [facility] Patient Information 2014 [place], [place].   ________________________________________________________________________

## 2021-05-23 ENCOUNTER — Encounter (HOSPITAL_COMMUNITY): Payer: Self-pay

## 2021-05-23 ENCOUNTER — Encounter (HOSPITAL_COMMUNITY)
Admission: RE | Admit: 2021-05-23 | Discharge: 2021-05-23 | Disposition: A | Discharge status: Home / Self Care | Payer: PPO | Source: Ambulatory Visit | Attending: Surgery | Admitting: Surgery

## 2021-05-23 ENCOUNTER — Other Ambulatory Visit: Payer: Self-pay

## 2021-05-23 VITALS — BP 127/80 | HR 94 | Temp 98.5°F | Resp 18 | Ht 67.0 in | Wt 185.0 lb

## 2021-05-23 DIAGNOSIS — R739 Hyperglycemia, unspecified: Secondary | ICD-10-CM | POA: Diagnosis not present

## 2021-05-23 DIAGNOSIS — Z01812 Encounter for preprocedural laboratory examination: Secondary | ICD-10-CM | POA: Insufficient documentation

## 2021-05-23 DIAGNOSIS — Z01818 Encounter for other preprocedural examination: Secondary | ICD-10-CM

## 2021-05-23 HISTORY — DX: Hypothyroidism, unspecified: E03.9

## 2021-05-23 LAB — CBC
HCT: 44.1 % (ref 36.0–46.0)
Hemoglobin: 14.5 g/dL (ref 12.0–15.0)
MCH: 29.2 pg (ref 26.0–34.0)
MCHC: 32.9 g/dL (ref 30.0–36.0)
MCV: 88.7 fL (ref 80.0–100.0)
Platelets: 350 10*3/uL (ref 150–400)
RBC: 4.97 MIL/uL (ref 3.87–5.11)
RDW: 13.7 % (ref 11.5–15.5)
WBC: 6.3 10*3/uL (ref 4.0–10.5)
nRBC: 0 % (ref 0.0–0.2)

## 2021-05-23 NOTE — Progress Notes (Signed)
COVID test-05/28/21 at 12:15 PM ? ? ?Bowel prep reminder:yes ? ?PCP - Dr. Leia Alf ?Cardiologist - none ? ?Chest x-ray - no ?EKG - no ?Stress Test - no ?ECHO - no ?Cardiac Cath - no ?Pacemaker/ICD device last checked:NA ? ?Sleep Study - no ?CPAP -  ? ?Fasting Blood Sugar - NA ?Checks Blood Sugar _____ times a day ? ?Blood Thinner Instructions:NA ?Aspirin Instructions: ?Last Dose: ? ?Anesthesia review: no ? ?Patient denies shortness of breath, fever, cough and chest pain at PAT appointment ?Pt has no SOB with any activities ? ?Patient verbalized understanding of instructions that were given to them at the PAT appointment. Patient was also instructed that they will need to review over the PAT instructions again at home before surgery. yes ?

## 2021-05-23 NOTE — Consult Note (Addendum)
[place] Nurse requested for preoperative stoma site marking ? ?Discussed surgical procedure and possible stoma creation with patient.  Explained role of the [place] nurse team.  Provided the patient with educational booklet and provided samples of pouching options. Answered patient's questions.  ? ?Examined patient sitting and standing in order to place the marking in the patient's visual field, away from any creases or abdominal contour issues and within the rectus muscle.  Attempted to mark below the patient's belt line.  A significant crease occurs lower on the abd when the patient leans forward which should be avoided if possible.  ? ?Marked for colostomy in the LLQ  ___7_ cm to the left of the umbilicus and __5__WS below the umbilicus. ? ?Marked for ileostomy in the RLQ  __7__cm to the right of the umbilicus and  ___5_ cm below the umbilicus. ? ?Patient's abdomen cleansed with CHG wipes at site markings, allowed to air dry prior to marking. Covered mark with thin film transparent dressing to preserve marks until date of surgery.  Provided patient with marking pen and instructed her to re-color in the locations if they begin to fade prior to surgery.  ? ?[place] Nurse team will follow up with patient after surgery for continued ostomy care and teaching if she receives an ostomy.  ?Julien Girt MSN, RN, [place], [place], CNS ?[phone]  ?

## 2021-05-24 LAB — HEMOGLOBIN A1C
Hgb A1c MFr Bld: 5.7 % — ABNORMAL HIGH (ref 4.8–5.6)
Mean Plasma Glucose: 117 mg/dL

## 2021-05-28 ENCOUNTER — Other Ambulatory Visit: Payer: Self-pay

## 2021-05-28 ENCOUNTER — Encounter (HOSPITAL_COMMUNITY)
Admission: RE | Admit: 2021-05-28 | Discharge: 2021-05-28 | Disposition: A | Discharge status: Home / Self Care | Payer: PPO | Source: Ambulatory Visit | Attending: Surgery | Admitting: Surgery

## 2021-05-28 DIAGNOSIS — K5792 Diverticulitis of intestine, part unspecified, without perforation or abscess without bleeding: Secondary | ICD-10-CM | POA: Diagnosis not present

## 2021-05-28 DIAGNOSIS — K660 Peritoneal adhesions (postprocedural) (postinfection): Secondary | ICD-10-CM | POA: Diagnosis not present

## 2021-05-28 DIAGNOSIS — K581 Irritable bowel syndrome with constipation: Secondary | ICD-10-CM | POA: Diagnosis present

## 2021-05-28 DIAGNOSIS — Z01818 Encounter for other preprocedural examination: Secondary | ICD-10-CM

## 2021-05-28 DIAGNOSIS — Z01812 Encounter for preprocedural laboratory examination: Secondary | ICD-10-CM | POA: Insufficient documentation

## 2021-05-28 DIAGNOSIS — Z20822 Contact with and (suspected) exposure to covid-19: Secondary | ICD-10-CM | POA: Insufficient documentation

## 2021-05-28 DIAGNOSIS — I700 Atherosclerosis of aorta: Secondary | ICD-10-CM | POA: Diagnosis present

## 2021-05-28 DIAGNOSIS — Z91041 Radiographic dye allergy status: Secondary | ICD-10-CM | POA: Diagnosis not present

## 2021-05-28 DIAGNOSIS — N736 Female pelvic peritoneal adhesions (postinfective): Secondary | ICD-10-CM | POA: Diagnosis present

## 2021-05-28 DIAGNOSIS — K219 Gastro-esophageal reflux disease without esophagitis: Secondary | ICD-10-CM | POA: Diagnosis present

## 2021-05-28 DIAGNOSIS — Z79890 Hormone replacement therapy (postmenopausal): Secondary | ICD-10-CM | POA: Diagnosis not present

## 2021-05-28 DIAGNOSIS — K6389 Other specified diseases of intestine: Secondary | ICD-10-CM | POA: Diagnosis not present

## 2021-05-28 DIAGNOSIS — J42 Unspecified chronic bronchitis: Secondary | ICD-10-CM | POA: Diagnosis present

## 2021-05-28 DIAGNOSIS — Z8249 Family history of ischemic heart disease and other diseases of the circulatory system: Secondary | ICD-10-CM | POA: Diagnosis not present

## 2021-05-28 DIAGNOSIS — E739 Lactose intolerance, unspecified: Secondary | ICD-10-CM | POA: Diagnosis present

## 2021-05-28 DIAGNOSIS — K5720 Diverticulitis of large intestine with perforation and abscess without bleeding: Secondary | ICD-10-CM | POA: Diagnosis present

## 2021-05-28 DIAGNOSIS — F32A Depression, unspecified: Secondary | ICD-10-CM | POA: Diagnosis present

## 2021-05-28 DIAGNOSIS — E039 Hypothyroidism, unspecified: Secondary | ICD-10-CM | POA: Diagnosis present

## 2021-05-28 DIAGNOSIS — Z79899 Other long term (current) drug therapy: Secondary | ICD-10-CM | POA: Diagnosis not present

## 2021-05-28 DIAGNOSIS — K5732 Diverticulitis of large intestine without perforation or abscess without bleeding: Secondary | ICD-10-CM | POA: Diagnosis not present

## 2021-05-28 DIAGNOSIS — K55049 Acute infarction of large intestine, extent unspecified: Secondary | ICD-10-CM | POA: Diagnosis not present

## 2021-05-28 LAB — SARS CORONAVIRUS 2 (TAT 6-24 HRS): SARS Coronavirus 2: NEGATIVE

## 2021-05-30 ENCOUNTER — Inpatient Hospital Stay (HOSPITAL_COMMUNITY): Payer: PPO | Admitting: Anesthesiology

## 2021-05-30 ENCOUNTER — Encounter (HOSPITAL_COMMUNITY): Payer: Self-pay | Admitting: Surgery

## 2021-05-30 ENCOUNTER — Other Ambulatory Visit: Payer: Self-pay

## 2021-05-30 ENCOUNTER — Inpatient Hospital Stay (HOSPITAL_COMMUNITY)
Admission: RE | Admit: 2021-05-30 | Discharge: 2021-06-01 | DRG: 331 | Disposition: A | Discharge status: Home / Self Care | Payer: PPO | Attending: Surgery | Admitting: Surgery

## 2021-05-30 ENCOUNTER — Other Ambulatory Visit (HOSPITAL_COMMUNITY): Payer: Self-pay

## 2021-05-30 ENCOUNTER — Encounter (HOSPITAL_COMMUNITY)
Admission: RE | Disposition: A | Discharge status: Home / Self Care | Payer: Self-pay | Source: Home / Self Care | Attending: Surgery

## 2021-05-30 ENCOUNTER — Inpatient Hospital Stay (HOSPITAL_COMMUNITY): Payer: PPO

## 2021-05-30 DIAGNOSIS — J42 Unspecified chronic bronchitis: Secondary | ICD-10-CM | POA: Diagnosis present

## 2021-05-30 DIAGNOSIS — Z91041 Radiographic dye allergy status: Secondary | ICD-10-CM

## 2021-05-30 DIAGNOSIS — K5720 Diverticulitis of large intestine with perforation and abscess without bleeding: Principal | ICD-10-CM | POA: Diagnosis present

## 2021-05-30 DIAGNOSIS — F32A Depression, unspecified: Secondary | ICD-10-CM | POA: Diagnosis present

## 2021-05-30 DIAGNOSIS — Z20822 Contact with and (suspected) exposure to covid-19: Secondary | ICD-10-CM | POA: Diagnosis present

## 2021-05-30 DIAGNOSIS — Z79890 Hormone replacement therapy (postmenopausal): Secondary | ICD-10-CM

## 2021-05-30 DIAGNOSIS — E739 Lactose intolerance, unspecified: Secondary | ICD-10-CM | POA: Diagnosis present

## 2021-05-30 DIAGNOSIS — K581 Irritable bowel syndrome with constipation: Secondary | ICD-10-CM | POA: Diagnosis present

## 2021-05-30 DIAGNOSIS — Z8249 Family history of ischemic heart disease and other diseases of the circulatory system: Secondary | ICD-10-CM | POA: Diagnosis not present

## 2021-05-30 DIAGNOSIS — N736 Female pelvic peritoneal adhesions (postinfective): Secondary | ICD-10-CM | POA: Diagnosis present

## 2021-05-30 DIAGNOSIS — K5732 Diverticulitis of large intestine without perforation or abscess without bleeding: Principal | ICD-10-CM | POA: Diagnosis present

## 2021-05-30 DIAGNOSIS — I700 Atherosclerosis of aorta: Secondary | ICD-10-CM | POA: Diagnosis present

## 2021-05-30 DIAGNOSIS — Z79899 Other long term (current) drug therapy: Secondary | ICD-10-CM

## 2021-05-30 DIAGNOSIS — E039 Hypothyroidism, unspecified: Secondary | ICD-10-CM | POA: Diagnosis present

## 2021-05-30 DIAGNOSIS — K219 Gastro-esophageal reflux disease without esophagitis: Secondary | ICD-10-CM | POA: Diagnosis present

## 2021-05-30 HISTORY — PX: XI ROBOTIC ASSISTED LOWER ANTERIOR RESECTION: SHX6558

## 2021-05-30 LAB — TYPE AND SCREEN
ABO/RH(D): A NEG
ABO/RH(D): A NEG
Antibody Screen: NEGATIVE
Antibody Screen: NEGATIVE

## 2021-05-30 SURGERY — RESECTION, RECTUM, LOW ANTERIOR, ROBOT-ASSISTED
Anesthesia: General

## 2021-05-30 MED ORDER — POLYETHYLENE GLYCOL 3350 17 GM/SCOOP PO POWD
1.0000 | Freq: Once | ORAL | Status: DC
Start: 2021-05-30 — End: 2021-05-30

## 2021-05-30 MED ORDER — PROPOFOL 10 MG/ML IV BOLUS
INTRAVENOUS | Status: AC
  Filled 2021-05-30: qty 20

## 2021-05-30 MED ORDER — NEOMYCIN SULFATE 500 MG PO TABS: 1000.0000 mg | ORAL_TABLET | ORAL | Status: DC

## 2021-05-30 MED ORDER — HYDROMORPHONE HCL 1 MG/ML IJ SOLN
0.5000 mg | INTRAMUSCULAR | Status: DC | PRN
  Administered 2021-05-30 – 2021-05-31 (×3): 1 mg via INTRAVENOUS
  Filled 2021-05-30 (×3): qty 1

## 2021-05-30 MED ORDER — DEXAMETHASONE SODIUM PHOSPHATE 10 MG/ML IJ SOLN
INTRAMUSCULAR | Status: AC
  Filled 2021-05-30: qty 1

## 2021-05-30 MED ORDER — ONDANSETRON HCL 4 MG PO TABS: 4.0000 mg | ORAL_TABLET | Freq: Four times a day (QID) | ORAL | Status: DC | PRN

## 2021-05-30 MED ORDER — PHENYLEPHRINE HCL (PRESSORS) 10 MG/ML IV SOLN
INTRAVENOUS | Status: AC
  Filled 2021-05-30: qty 1

## 2021-05-30 MED ORDER — PAROXETINE HCL 20 MG PO TABS
20.0000 mg | ORAL_TABLET | Freq: Every day | ORAL | Status: DC
  Administered 2021-05-31 – 2021-06-01 (×2): 20 mg via ORAL
  Filled 2021-05-30 (×2): qty 1

## 2021-05-30 MED ORDER — LACTATED RINGERS IV SOLN: INTRAVENOUS | Status: DC | PRN

## 2021-05-30 MED ORDER — ROSUVASTATIN CALCIUM 10 MG PO TABS
10.0000 mg | ORAL_TABLET | Freq: Every day | ORAL | Status: DC
  Administered 2021-05-31 – 2021-06-01 (×2): 10 mg via ORAL
  Filled 2021-05-30 (×2): qty 1

## 2021-05-30 MED ORDER — ALVIMOPAN 12 MG PO CAPS
12.0000 mg | ORAL_CAPSULE | Freq: Two times a day (BID) | ORAL | Status: DC
  Administered 2021-05-31: 12 mg via ORAL
  Filled 2021-05-30: qty 1

## 2021-05-30 MED ORDER — SUGAMMADEX SODIUM 200 MG/2ML IV SOLN
INTRAVENOUS | Status: DC | PRN
  Administered 2021-05-30: 200 mg via INTRAVENOUS

## 2021-05-30 MED ORDER — FENTANYL CITRATE (PF) 100 MCG/2ML IJ SOLN
INTRAMUSCULAR | Status: AC
Start: 2021-05-30 — End: ?
  Filled 2021-05-30: qty 2

## 2021-05-30 MED ORDER — METHOCARBAMOL 1000 MG/10ML IJ SOLN
1000.0000 mg | Freq: Four times a day (QID) | INTRAVENOUS | Status: DC | PRN
  Filled 2021-05-30: qty 10

## 2021-05-30 MED ORDER — LACTATED RINGERS IR SOLN
Status: DC | PRN
  Administered 2021-05-30: 1000 mL

## 2021-05-30 MED ORDER — ENALAPRILAT 1.25 MG/ML IV SOLN
0.6250 mg | Freq: Four times a day (QID) | INTRAVENOUS | Status: DC | PRN
  Filled 2021-05-30: qty 1

## 2021-05-30 MED ORDER — SODIUM CHLORIDE 0.9 % IV SOLN
2.0000 g | Freq: Two times a day (BID) | INTRAVENOUS | Status: AC
  Administered 2021-05-30: 2 g via INTRAVENOUS
  Filled 2021-05-30: qty 2

## 2021-05-30 MED ORDER — PROCHLORPERAZINE EDISYLATE 10 MG/2ML IJ SOLN: 5.0000 mg | Freq: Four times a day (QID) | INTRAMUSCULAR | Status: DC | PRN

## 2021-05-30 MED ORDER — GABAPENTIN 300 MG PO CAPS
300.0000 mg | ORAL_CAPSULE | ORAL | Status: AC
Start: 2021-05-30 — End: 2021-05-30
  Administered 2021-05-30: 300 mg via ORAL
  Filled 2021-05-30: qty 1

## 2021-05-30 MED ORDER — CHLORHEXIDINE GLUCONATE 0.12 % MT SOLN
15.0000 mL | Freq: Once | OROMUCOSAL | Status: AC
  Administered 2021-05-30: 15 mL via OROMUCOSAL

## 2021-05-30 MED ORDER — ALUM & MAG HYDROXIDE-SIMETH 200-200-20 MG/5ML PO SUSP: 30.0000 mL | Freq: Four times a day (QID) | ORAL | Status: DC | PRN

## 2021-05-30 MED ORDER — AMITRIPTYLINE HCL 10 MG PO TABS
10.0000 mg | ORAL_TABLET | Freq: Every day | ORAL | Status: DC
  Administered 2021-05-30 – 2021-05-31 (×2): 10 mg via ORAL
  Filled 2021-05-30 (×2): qty 1

## 2021-05-30 MED ORDER — ZOLPIDEM TARTRATE 5 MG PO TABS: 5.0000 mg | ORAL_TABLET | Freq: Every evening | ORAL | Status: DC | PRN

## 2021-05-30 MED ORDER — ACETAMINOPHEN 500 MG PO TABS
1000.0000 mg | ORAL_TABLET | Freq: Four times a day (QID) | ORAL | Status: DC
  Administered 2021-05-31 – 2021-06-01 (×4): 1000 mg via ORAL
  Filled 2021-05-30 (×4): qty 2

## 2021-05-30 MED ORDER — DEXAMETHASONE SODIUM PHOSPHATE 10 MG/ML IJ SOLN
INTRAMUSCULAR | Status: DC | PRN
Start: 2021-05-30 — End: 2021-05-30
  Administered 2021-05-30: 8 mg via INTRAVENOUS

## 2021-05-30 MED ORDER — VITAMIN D 25 MCG (1000 UNIT) PO TABS
1000.0000 [IU] | ORAL_TABLET | Freq: Every day | ORAL | Status: DC
  Administered 2021-05-31 – 2021-06-01 (×2): 1000 [IU] via ORAL
  Filled 2021-05-30 (×2): qty 1

## 2021-05-30 MED ORDER — PROPOFOL 10 MG/ML IV BOLUS
INTRAVENOUS | Status: DC | PRN
  Administered 2021-05-30: 100 mg via INTRAVENOUS

## 2021-05-30 MED ORDER — ONDANSETRON HCL 4 MG/2ML IJ SOLN: 4.0000 mg | Freq: Four times a day (QID) | INTRAMUSCULAR | Status: DC | PRN

## 2021-05-30 MED ORDER — SIMETHICONE 80 MG PO CHEW: 40.0000 mg | CHEWABLE_TABLET | Freq: Four times a day (QID) | ORAL | Status: DC | PRN

## 2021-05-30 MED ORDER — ONDANSETRON HCL 4 MG/2ML IJ SOLN
INTRAMUSCULAR | Status: AC
  Filled 2021-05-30: qty 2

## 2021-05-30 MED ORDER — PROPOFOL 500 MG/50ML IV EMUL
INTRAVENOUS | Status: DC | PRN
  Administered 2021-05-30: 30 ug/kg/min via INTRAVENOUS

## 2021-05-30 MED ORDER — HYDRALAZINE HCL 20 MG/ML IJ SOLN: 10.0000 mg | INTRAMUSCULAR | Status: DC | PRN

## 2021-05-30 MED ORDER — ENSURE SURGERY PO LIQD
237.0000 mL | Freq: Two times a day (BID) | ORAL | Status: DC
  Administered 2021-05-31 – 2021-06-01 (×3): 237 mL via ORAL

## 2021-05-30 MED ORDER — TRAMADOL HCL 50 MG PO TABS
50.0000 mg | ORAL_TABLET | Freq: Four times a day (QID) | ORAL | Status: DC | PRN
  Administered 2021-05-30 – 2021-06-01 (×6): 100 mg via ORAL
  Administered 2021-06-01: 50 mg via ORAL
  Filled 2021-05-30 (×8): qty 2

## 2021-05-30 MED ORDER — ENSURE PRE-SURGERY PO LIQD: 592.0000 mL | Freq: Once | ORAL | Status: DC

## 2021-05-30 MED ORDER — KETOROLAC TROMETHAMINE 15 MG/ML IJ SOLN
15.0000 mg | Freq: Once | INTRAMUSCULAR | Status: AC | PRN
  Administered 2021-05-30: 15 mg via INTRAVENOUS

## 2021-05-30 MED ORDER — CALCIUM POLYCARBOPHIL 625 MG PO TABS
625.0000 mg | ORAL_TABLET | Freq: Two times a day (BID) | ORAL | Status: DC
Start: 2021-05-30 — End: 2021-06-01
  Administered 2021-05-30 – 2021-06-01 (×4): 625 mg via ORAL
  Filled 2021-05-30 (×4): qty 1

## 2021-05-30 MED ORDER — BUPIVACAINE-EPINEPHRINE (PF) 0.25% -1:200000 IJ SOLN
INTRAMUSCULAR | Status: AC
  Filled 2021-05-30: qty 60

## 2021-05-30 MED ORDER — TRAMADOL HCL 50 MG PO TABS
50.0000 mg | ORAL_TABLET | Freq: Four times a day (QID) | ORAL | 0 refills | Status: DC | PRN
  Filled 2021-05-30: qty 20, 3d supply, fill #0

## 2021-05-30 MED ORDER — ALVIMOPAN 12 MG PO CAPS
12.0000 mg | ORAL_CAPSULE | ORAL | Status: AC
  Administered 2021-05-30: 12 mg via ORAL
  Filled 2021-05-30: qty 1

## 2021-05-30 MED ORDER — ONDANSETRON HCL 4 MG/2ML IJ SOLN: 4.0000 mg | Freq: Once | INTRAMUSCULAR | Status: DC | PRN

## 2021-05-30 MED ORDER — AMISULPRIDE (ANTIEMETIC) 5 MG/2ML IV SOLN: 10.0000 mg | Freq: Once | INTRAVENOUS | Status: DC | PRN

## 2021-05-30 MED ORDER — METOPROLOL TARTRATE 5 MG/5ML IV SOLN: 5.0000 mg | Freq: Four times a day (QID) | INTRAVENOUS | Status: DC | PRN

## 2021-05-30 MED ORDER — ORAL CARE MOUTH RINSE: 15.0000 mL | Freq: Once | OROMUCOSAL | Status: AC

## 2021-05-30 MED ORDER — LACTATED RINGERS IV BOLUS
1000.0000 mL | Freq: Three times a day (TID) | INTRAVENOUS | Status: DC | PRN
Start: 2021-05-30 — End: 2021-06-01

## 2021-05-30 MED ORDER — PANTOPRAZOLE SODIUM 40 MG PO TBEC
40.0000 mg | DELAYED_RELEASE_TABLET | Freq: Every day | ORAL | Status: DC
  Administered 2021-05-31 – 2021-06-01 (×2): 40 mg via ORAL
  Filled 2021-05-30 (×2): qty 1

## 2021-05-30 MED ORDER — BUPIVACAINE LIPOSOME 1.3 % IJ SUSP
INTRAMUSCULAR | Status: DC | PRN
  Administered 2021-05-30: 20 mL

## 2021-05-30 MED ORDER — SODIUM CHLORIDE 0.9 % IV SOLN
2.0000 g | INTRAVENOUS | Status: AC
  Administered 2021-05-30: 2 g via INTRAVENOUS
  Filled 2021-05-30: qty 2

## 2021-05-30 MED ORDER — RISAQUAD PO CAPS
1.0000 | ORAL_CAPSULE | Freq: Every day | ORAL | Status: DC
  Administered 2021-05-31 – 2021-06-01 (×2): 1 via ORAL
  Filled 2021-05-30 (×2): qty 1

## 2021-05-30 MED ORDER — MELATONIN 3 MG PO TABS
3.0000 mg | ORAL_TABLET | Freq: Every evening | ORAL | Status: DC | PRN
  Filled 2021-05-30: qty 1

## 2021-05-30 MED ORDER — DORZOLAMIDE HCL-TIMOLOL MAL 2-0.5 % OP SOLN
1.0000 [drp] | Freq: Two times a day (BID) | OPHTHALMIC | Status: DC
  Administered 2021-05-30 – 2021-06-01 (×4): 1 [drp] via OPHTHALMIC
  Filled 2021-05-30: qty 10

## 2021-05-30 MED ORDER — POTASSIUM CITRATE ER 10 MEQ (1080 MG) PO TBCR
20.0000 meq | EXTENDED_RELEASE_TABLET | Freq: Every day | ORAL | Status: DC
  Administered 2021-05-31 – 2021-06-01 (×2): 20 meq via ORAL
  Filled 2021-05-30 (×3): qty 2

## 2021-05-30 MED ORDER — LACTATED RINGERS IV SOLN: INTRAVENOUS | Status: AC

## 2021-05-30 MED ORDER — PROCHLORPERAZINE MALEATE 10 MG PO TABS
10.0000 mg | ORAL_TABLET | Freq: Four times a day (QID) | ORAL | Status: DC | PRN
  Filled 2021-05-30: qty 1

## 2021-05-30 MED ORDER — METRONIDAZOLE 500 MG PO TABS
1000.0000 mg | ORAL_TABLET | ORAL | Status: DC
Start: 2021-05-30 — End: 2021-05-30

## 2021-05-30 MED ORDER — LIDOCAINE HCL (CARDIAC) PF 100 MG/5ML IV SOSY
PREFILLED_SYRINGE | INTRAVENOUS | Status: DC | PRN
Start: 2021-05-30 — End: 2021-05-30
  Administered 2021-05-30: 60 mg via INTRAVENOUS

## 2021-05-30 MED ORDER — ONDANSETRON HCL 4 MG/2ML IJ SOLN
INTRAMUSCULAR | Status: DC | PRN
  Administered 2021-05-30: 4 mg via INTRAVENOUS

## 2021-05-30 MED ORDER — BUPIVACAINE-EPINEPHRINE (PF) 0.25% -1:200000 IJ SOLN
INTRAMUSCULAR | Status: DC | PRN
  Administered 2021-05-30: 60 mL

## 2021-05-30 MED ORDER — ENOXAPARIN SODIUM 40 MG/0.4ML IJ SOSY
40.0000 mg | PREFILLED_SYRINGE | INTRAMUSCULAR | Status: DC
  Administered 2021-05-31 – 2021-06-01 (×2): 40 mg via SUBCUTANEOUS
  Filled 2021-05-30 (×2): qty 0.4

## 2021-05-30 MED ORDER — MIDAZOLAM HCL 2 MG/2ML IJ SOLN
INTRAMUSCULAR | Status: AC
  Filled 2021-05-30: qty 2

## 2021-05-30 MED ORDER — BUPIVACAINE LIPOSOME 1.3 % IJ SUSP: 20.0000 mL | Freq: Once | INTRAMUSCULAR | Status: DC

## 2021-05-30 MED ORDER — MAGIC MOUTHWASH
15.0000 mL | Freq: Four times a day (QID) | ORAL | Status: DC | PRN
Start: 2021-05-30 — End: 2021-06-01
  Filled 2021-05-30: qty 15

## 2021-05-30 MED ORDER — 0.9 % SODIUM CHLORIDE (POUR BTL) OPTIME
TOPICAL | Status: DC | PRN
  Administered 2021-05-30: 2000 mL

## 2021-05-30 MED ORDER — PHENYLEPHRINE HCL-NACL 20-0.9 MG/250ML-% IV SOLN
INTRAVENOUS | Status: DC | PRN
  Administered 2021-05-30: 50 ug/min via INTRAVENOUS

## 2021-05-30 MED ORDER — BUPIVACAINE LIPOSOME 1.3 % IJ SUSP
INTRAMUSCULAR | Status: AC
  Filled 2021-05-30: qty 20

## 2021-05-30 MED ORDER — BISACODYL 5 MG PO TBEC: 20.0000 mg | DELAYED_RELEASE_TABLET | Freq: Once | ORAL | Status: DC

## 2021-05-30 MED ORDER — STERILE WATER FOR INJECTION IJ SOLN
INTRAMUSCULAR | Status: DC | PRN
  Administered 2021-05-30: 15 mL

## 2021-05-30 MED ORDER — DIPHENHYDRAMINE HCL 12.5 MG/5ML PO ELIX: 12.5000 mg | ORAL_SOLUTION | Freq: Four times a day (QID) | ORAL | Status: DC | PRN

## 2021-05-30 MED ORDER — VITAMIN E 45 MG (100 UNIT) PO CAPS
400.0000 [IU] | ORAL_CAPSULE | Freq: Every day | ORAL | Status: DC
  Administered 2021-05-31 – 2021-06-01 (×2): 400 [IU] via ORAL
  Filled 2021-05-30 (×3): qty 4

## 2021-05-30 MED ORDER — ADULT MULTIVITAMIN W/MINERALS CH
1.0000 | ORAL_TABLET | Freq: Every day | ORAL | Status: DC
  Administered 2021-05-31 – 2021-06-01 (×2): 1 via ORAL
  Filled 2021-05-30 (×2): qty 1

## 2021-05-30 MED ORDER — B COMPLEX-C PO TABS
1.0000 | ORAL_TABLET | Freq: Every day | ORAL | Status: DC
  Administered 2021-05-31 – 2021-06-01 (×2): 1 via ORAL
  Filled 2021-05-30 (×3): qty 1

## 2021-05-30 MED ORDER — ACETAMINOPHEN 500 MG PO TABS
1000.0000 mg | ORAL_TABLET | ORAL | Status: AC
  Administered 2021-05-30: 1000 mg via ORAL
  Filled 2021-05-30: qty 2

## 2021-05-30 MED ORDER — MIDAZOLAM HCL 5 MG/5ML IJ SOLN
INTRAMUSCULAR | Status: DC | PRN
  Administered 2021-05-30: 2 mg via INTRAVENOUS

## 2021-05-30 MED ORDER — STERILE WATER FOR INJECTION IJ SOLN
INTRAMUSCULAR | Status: AC
  Filled 2021-05-30: qty 10

## 2021-05-30 MED ORDER — IOHEXOL 300 MG/ML  SOLN
INTRAMUSCULAR | Status: DC | PRN
  Administered 2021-05-30: 10 mL

## 2021-05-30 MED ORDER — ENOXAPARIN SODIUM 40 MG/0.4ML IJ SOSY
40.0000 mg | PREFILLED_SYRINGE | Freq: Once | INTRAMUSCULAR | Status: AC
  Administered 2021-05-30: 40 mg via SUBCUTANEOUS
  Filled 2021-05-30: qty 0.4

## 2021-05-30 MED ORDER — DIPHENHYDRAMINE HCL 50 MG/ML IJ SOLN: 12.5000 mg | Freq: Four times a day (QID) | INTRAMUSCULAR | Status: DC | PRN

## 2021-05-30 MED ORDER — KETOROLAC TROMETHAMINE 15 MG/ML IJ SOLN
INTRAMUSCULAR | Status: AC
  Filled 2021-05-30: qty 1

## 2021-05-30 MED ORDER — LIP MEDEX EX OINT
1.0000 "application " | TOPICAL_OINTMENT | Freq: Two times a day (BID) | CUTANEOUS | Status: DC
  Administered 2021-05-30 – 2021-06-01 (×4): 1 via TOPICAL
  Filled 2021-05-30: qty 7

## 2021-05-30 MED ORDER — ZINC SULFATE 220 (50 ZN) MG PO CAPS
220.0000 mg | ORAL_CAPSULE | Freq: Every day | ORAL | Status: DC
  Administered 2021-05-31 – 2021-06-01 (×2): 220 mg via ORAL
  Filled 2021-05-30 (×3): qty 1

## 2021-05-30 MED ORDER — FENTANYL CITRATE (PF) 100 MCG/2ML IJ SOLN
INTRAMUSCULAR | Status: DC | PRN
  Administered 2021-05-30: 100 ug via INTRAVENOUS

## 2021-05-30 MED ORDER — FENTANYL CITRATE PF 50 MCG/ML IJ SOSY: 25.0000 ug | PREFILLED_SYRINGE | INTRAMUSCULAR | Status: DC | PRN

## 2021-05-30 MED ORDER — LEVOTHYROXINE SODIUM 50 MCG PO TABS
50.0000 ug | ORAL_TABLET | Freq: Every day | ORAL | Status: DC
  Administered 2021-05-31 – 2021-06-01 (×2): 50 ug via ORAL
  Filled 2021-05-30 (×2): qty 1

## 2021-05-30 MED ORDER — LACTATED RINGERS IV SOLN: INTRAVENOUS | Status: DC

## 2021-05-30 MED ORDER — ENSURE PRE-SURGERY PO LIQD: 296.0000 mL | Freq: Once | ORAL | Status: DC

## 2021-05-30 MED ORDER — ROCURONIUM BROMIDE 100 MG/10ML IV SOLN
INTRAVENOUS | Status: DC | PRN
  Administered 2021-05-30: 20 mg via INTRAVENOUS
  Administered 2021-05-30: 60 mg via INTRAVENOUS

## 2021-05-30 MED ORDER — PHENYLEPHRINE 40 MCG/ML (10ML) SYRINGE FOR IV PUSH (FOR BLOOD PRESSURE SUPPORT)
PREFILLED_SYRINGE | INTRAVENOUS | Status: DC | PRN
  Administered 2021-05-30 (×2): 80 ug via INTRAVENOUS
  Administered 2021-05-30: 120 ug via INTRAVENOUS
  Administered 2021-05-30: 80 ug via INTRAVENOUS

## 2021-05-30 SURGICAL SUPPLY — 127 items
ADAPTER GOLDBERG URETERAL (ADAPTER) IMPLANT
ADPR CATH 15X14FR FL DRN BG (ADAPTER)
APL PRP STRL LF DISP 70% ISPRP (MISCELLANEOUS)
APPLIER CLIP 5 13 M/L LIGAMAX5 (MISCELLANEOUS)
APPLIER CLIP ROT 10 11.4 M/L (STAPLE)
APR CLP MED LRG 11.4X10 (STAPLE)
APR CLP MED LRG 5 ANG JAW (MISCELLANEOUS)
BAG COUNTER SPONGE SURGICOUNT (BAG) ×2 IMPLANT
BAG SPNG CNTER NS LX DISP (BAG) ×1
BAG URO CATCHER STRL LF (MISCELLANEOUS) ×2 IMPLANT
BLADE EXTENDED COATED 6.5IN (ELECTRODE) IMPLANT
CANNULA REDUC XI 12-8 STAPL (CANNULA)
CANNULA REDUCER 12-8 DVNC XI (CANNULA) IMPLANT
CATH URETL OPEN END 6FR 70 (CATHETERS) ×1 IMPLANT
CELLS DAT CNTRL 66122 CELL SVR (MISCELLANEOUS) IMPLANT
CHLORAPREP W/TINT 26 (MISCELLANEOUS) IMPLANT
CLIP APPLIE 5 13 M/L LIGAMAX5 (MISCELLANEOUS) IMPLANT
CLIP APPLIE ROT 10 11.4 M/L (STAPLE) IMPLANT
CLOTH BEACON ORANGE TIMEOUT ST (SAFETY) ×2 IMPLANT
COVER SURGICAL LIGHT HANDLE (MISCELLANEOUS) ×4 IMPLANT
COVER TIP SHEARS 8 DVNC (MISCELLANEOUS) ×1 IMPLANT
COVER TIP SHEARS 8MM DA VINCI (MISCELLANEOUS) ×2
DEVICE TROCAR PUNCTURE CLOSURE (ENDOMECHANICALS) IMPLANT
DRAIN CHANNEL 19F RND (DRAIN) IMPLANT
DRAPE ARM DVNC X/XI (DISPOSABLE) ×4 IMPLANT
DRAPE COLUMN DVNC XI (DISPOSABLE) ×1 IMPLANT
DRAPE DA VINCI XI ARM (DISPOSABLE) ×8
DRAPE DA VINCI XI COLUMN (DISPOSABLE) ×2
DRAPE SURG IRRIG POUCH 19X23 (DRAPES) ×2 IMPLANT
DRSG OPSITE POSTOP 4X10 (GAUZE/BANDAGES/DRESSINGS) IMPLANT
DRSG OPSITE POSTOP 4X6 (GAUZE/BANDAGES/DRESSINGS) ×1 IMPLANT
DRSG OPSITE POSTOP 4X8 (GAUZE/BANDAGES/DRESSINGS) IMPLANT
DRSG TEGADERM 2-3/8X2-3/4 SM (GAUZE/BANDAGES/DRESSINGS) ×6 IMPLANT
DRSG TEGADERM 4X4.75 (GAUZE/BANDAGES/DRESSINGS) IMPLANT
ELECT PENCIL ROCKER SW 15FT (MISCELLANEOUS) ×2 IMPLANT
ELECT REM PT RETURN 15FT ADLT (MISCELLANEOUS) ×2 IMPLANT
ENDOLOOP SUT PDS II  0 18 (SUTURE)
ENDOLOOP SUT PDS II 0 18 (SUTURE) IMPLANT
EVACUATOR SILICONE 100CC (DRAIN) IMPLANT
GAUZE SPONGE 2X2 8PLY STRL LF (GAUZE/BANDAGES/DRESSINGS) ×1 IMPLANT
GLOVE SURG ENC TEXT LTX SZ7.5 (GLOVE) ×2 IMPLANT
GLOVE SURG NEOPR MICRO LF SZ8 (GLOVE) ×6 IMPLANT
GLOVE SURG UNDER LTX SZ8 (GLOVE) ×6 IMPLANT
GOWN STRL REUS W/ TWL XL LVL3 (GOWN DISPOSABLE) ×4 IMPLANT
GOWN STRL REUS W/TWL XL LVL3 (GOWN DISPOSABLE) ×8
GRASPER SUT TROCAR 14GX15 (MISCELLANEOUS) IMPLANT
GUIDEWIRE ANG ZIPWIRE 038X150 (WIRE) IMPLANT
GUIDEWIRE STR DUAL SENSOR (WIRE) IMPLANT
HOLDER FOLEY CATH W/STRAP (MISCELLANEOUS) ×2 IMPLANT
IRRIG SUCT STRYKERFLOW 2 WTIP (MISCELLANEOUS) ×2
IRRIGATION SUCT STRKRFLW 2 WTP (MISCELLANEOUS) ×1 IMPLANT
KIT PROCEDURE DA VINCI SI (MISCELLANEOUS) ×2
KIT PROCEDURE DVNC SI (MISCELLANEOUS) ×1 IMPLANT
KIT SIGMOIDOSCOPE (SET/KITS/TRAYS/PACK) ×1 IMPLANT
KIT TURNOVER KIT A (KITS) IMPLANT
MANIFOLD NEPTUNE II (INSTRUMENTS) ×2 IMPLANT
NDL INSUFFLATION 14GA 120MM (NEEDLE) ×1 IMPLANT
NEEDLE INSUFFLATION 14GA 120MM (NEEDLE) ×2 IMPLANT
PACK CARDIOVASCULAR III (CUSTOM PROCEDURE TRAY) ×2 IMPLANT
PACK COLON (CUSTOM PROCEDURE TRAY) ×2 IMPLANT
PACK CYSTO (CUSTOM PROCEDURE TRAY) ×2 IMPLANT
PAD POSITIONING PINK XL (MISCELLANEOUS) ×2 IMPLANT
PROTECTOR NERVE ULNAR (MISCELLANEOUS) ×4 IMPLANT
RELOAD STAPLE 45 3.5 BLU DVNC (STAPLE) IMPLANT
RELOAD STAPLE 45 4.3 GRN DVNC (STAPLE) IMPLANT
RELOAD STAPLE 60 3.5 BLU DVNC (STAPLE) IMPLANT
RELOAD STAPLE 60 4.3 GRN DVNC (STAPLE) IMPLANT
RELOAD STAPLER 3.5X45 BLU DVNC (STAPLE) IMPLANT
RELOAD STAPLER 3.5X60 BLU DVNC (STAPLE) IMPLANT
RELOAD STAPLER 4.3X45 GRN DVNC (STAPLE) IMPLANT
RELOAD STAPLER 4.3X60 GRN DVNC (STAPLE) ×1 IMPLANT
RETRACTOR WND ALEXIS 18 MED (MISCELLANEOUS) IMPLANT
RTRCTR WOUND ALEXIS 18CM MED (MISCELLANEOUS)
SCISSORS LAP 5X35 DISP (ENDOMECHANICALS) ×2 IMPLANT
SEAL CANN UNIV 5-8 DVNC XI (MISCELLANEOUS) ×3 IMPLANT
SEAL XI 5MM-8MM UNIVERSAL (MISCELLANEOUS) ×6
SEALER VESSEL DA VINCI XI (MISCELLANEOUS) ×2
SEALER VESSEL EXT DVNC XI (MISCELLANEOUS) ×1 IMPLANT
SOLUTION ELECTROLUBE (MISCELLANEOUS) ×2 IMPLANT
SPIKE FLUID TRANSFER (MISCELLANEOUS) ×2 IMPLANT
SPONGE GAUZE 2X2 STER 10/PKG (GAUZE/BANDAGES/DRESSINGS) ×1
STAPLER 45 DA VINCI SURE FORM (STAPLE)
STAPLER 45 SUREFORM DVNC (STAPLE) IMPLANT
STAPLER 60 DA VINCI SURE FORM (STAPLE) ×2
STAPLER 60 SUREFORM DVNC (STAPLE) IMPLANT
STAPLER CANNULA SEAL DVNC XI (STAPLE) ×1 IMPLANT
STAPLER CANNULA SEAL XI (STAPLE) ×2
STAPLER ECHELON POWER CIR 29 (STAPLE) IMPLANT
STAPLER ECHELON POWER CIR 31 (STAPLE) ×1 IMPLANT
STAPLER RELOAD 3.5X45 BLU DVNC (STAPLE)
STAPLER RELOAD 3.5X45 BLUE (STAPLE)
STAPLER RELOAD 3.5X60 BLU DVNC (STAPLE)
STAPLER RELOAD 3.5X60 BLUE (STAPLE)
STAPLER RELOAD 4.3X45 GREEN (STAPLE)
STAPLER RELOAD 4.3X45 GRN DVNC (STAPLE)
STAPLER RELOAD 4.3X60 GREEN (STAPLE) ×2
STAPLER RELOAD 4.3X60 GRN DVNC (STAPLE) ×1
STOPCOCK 4 WAY LG BORE MALE ST (IV SETS) ×4 IMPLANT
SURGILUBE 2OZ TUBE FLIPTOP (MISCELLANEOUS) IMPLANT
SUT MNCRL AB 4-0 PS2 18 (SUTURE) ×2 IMPLANT
SUT PDS AB 1 CT1 27 (SUTURE) ×4 IMPLANT
SUT PROLENE 0 CT 2 (SUTURE) IMPLANT
SUT PROLENE 2 0 KS (SUTURE) IMPLANT
SUT PROLENE 2 0 SH DA (SUTURE) IMPLANT
SUT SILK 2 0 (SUTURE)
SUT SILK 2 0 SH CR/8 (SUTURE) IMPLANT
SUT SILK 2-0 18XBRD TIE 12 (SUTURE) IMPLANT
SUT SILK 3 0 (SUTURE)
SUT SILK 3 0 SH CR/8 (SUTURE) ×2 IMPLANT
SUT SILK 3-0 18XBRD TIE 12 (SUTURE) IMPLANT
SUT V-LOC BARB 180 2/0GR6 GS22 (SUTURE)
SUT VIC AB 3-0 SH 18 (SUTURE) IMPLANT
SUT VIC AB 3-0 SH 27 (SUTURE)
SUT VIC AB 3-0 SH 27XBRD (SUTURE) IMPLANT
SUT VICRYL 0 UR6 27IN ABS (SUTURE) ×2 IMPLANT
SUTURE V-LC BRB 180 2/0GR6GS22 (SUTURE) IMPLANT
SYR 10ML ECCENTRIC (SYRINGE) ×2 IMPLANT
SYS LAPSCP GELPORT 120MM (MISCELLANEOUS)
SYS WOUND ALEXIS 18CM MED (MISCELLANEOUS) ×2
SYSTEM LAPSCP GELPORT 120MM (MISCELLANEOUS) IMPLANT
SYSTEM WOUND ALEXIS 18CM MED (MISCELLANEOUS) ×1 IMPLANT
TOWEL OR NON WOVEN STRL DISP B (DISPOSABLE) ×2 IMPLANT
TRAY FOLEY MTR SLVR 16FR STAT (SET/KITS/TRAYS/PACK) ×2 IMPLANT
TROCAR ADV FIXATION 5X100MM (TROCAR) ×2 IMPLANT
TUBING CONNECTING 10 (TUBING) ×6 IMPLANT
TUBING INSUFFLATION 10FT LAP (TUBING) ×2 IMPLANT
TUBING UROLOGY SET (TUBING) IMPLANT

## 2021-05-30 NOTE — H&P (Signed)
05/30/2021 ? ? ? ? ?REFERRING PHYSICIAN: Self ? ?Patient Care Team: ?Arnetha Courser, MD as PCP - General (Family Medicine) ?Alexsis Kathman, Adrian Saran, MD as Consulting Provider (General Surgery) ?Recardo Evangelist, MD (Gastroenterology) ?Minda Ditto, PA as Physician Assistant (Gastroenterology) ?Arta Silence, MD (Internal Medicine) ? ?PROVIDER: Hollace Kinnier, MD ? ?[facility] MRN: [id number] ?DOB: 12/05/1950 ?DATE OF ENCOUNTER: 04/23/2021 ? ?Subjective  ? ?Chief Complaint: Long term follow up  ? ? ?History of Present Illness: ?Amy Glenn is a 71 y.o. female who is seen today as an office consultation at the request of Dr. Pasty Arch for evaluation of recurrent diverticulitis ?Marland Kitchen  ?Patient returns. I saw her in summer 2022. She is seen by my partner after her diagnosis of diverticulitis in April. She had only had 1 attack and seemed to recover with antibiotic pills only. Recalls having a colonoscopy in 2021 that was underwhelming except for 1 polyp in [facility]. I recommended high-fiber diet and monitoring. She had episode of abdominal pain in early winter. CAT scan February 14, 2021 showed no diverticulitis. Reassuring. However she had an episode of worsening crampy abdominal pain. Came to this emergency department last week. Definite diverticulitis. Switch to Cipro and Flagyl. She is just finished that course and comes in. She is feeling better. However she is worried about repeated attacks. This last attack was very severe and she almost was admitted. She would like to avoid that again and wishes to consider surgery. ? ?Prior note 10/2020: ?Active woman with diagnosis of sigmoid diverticulitis in April 2022. Followed by [facility] gastroenterology. Had colonoscopy last year that was apparently underwhelming. Diverticulosis noted and adenomatous polyps noted. She did have some postprocedural emesis that resolved quickly. Struggles with some constipation back then. 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 Looks like she was trying to establish care at [facility] gastroenterology to be closer. So my partner, Dr. Barry Dienes, in June. She was discussed with the colorectal surgeon. Appointment made. ? ?Patient notes that she was on antibiotics in April and May but has been off antibiotics for the past 2 months. Has felt some discomfort when she sits forward with pulling but that is faded waited as well. She is moving her bowels daily. With her irregular bowels, she discussed with gastroenterology and her doctors. She has switched to a regimen of MiraLAX and probiotics that seem to help with her irritable bowel. Denies any bloating or constipation issues anymore. She denies any prior episodes of diverticulitis and there are no CT scans to state otherwise. She is nondiabetic. She does not smoke. She has had to have open cholecystectomy, excision of ectopic pregnancies as well as appendectomy. No recent abdominal surgery. No sleep apnea. She has some lactose intolerance but otherwise no major GI issues in her family or herself  ? ?Medical History: ?Past Medical History:  ?Diagnosis Date  ? Anxiety  ? GERD (gastroesophageal reflux disease)  ? Glaucoma (increased eye pressure)  ? Liver disease  ? Thyroid disease  ? ?Patient Active Problem List  ?Diagnosis  ? Irritable bowel syndrome with constipation  ? Left lower quadrant abdominal pain  ? Diverticulitis of sigmoid colon  ? ?Past Surgical History:  ?Procedure Laterality Date  ? partial knee replacement N/A  ? shoulder surgery N/A  ? ? ?No Known Allergies ? ?Current Outpatient Medications on File Prior to Visit  ?Medication Sig Dispense Refill  ? amitriptyline (ELAVIL) 10 MG tablet  Take 20 mg by mouth once daily  ? lansoprazole (PREVACID) 15 MG DR capsule Take by mouth  ? meloxicam (MOBIC) 15 MG tablet TAKE 1 TABLET BY MOUTH ONCE DAILY WITH MEALS  ? moxifloxacin (VIGAMOX) 0.5 % ophthalmic solution 1 drop.   ? PARoxetine (PAXIL) 20 MG tablet Take 20 mg by mouth every morning  ? rosuvastatin (CRESTOR) 10 MG tablet Take 1 tablet by mouth once daily  ? zolpidem (AMBIEN) 5 MG tablet Take by mouth  ? ?No current facility-administered medications on file prior to visit.  ? ?History reviewed. No pertinent family history.  ? ?Social History  ? ?Tobacco Use  ?Smoking Status Never  ?Smokeless Tobacco Never  ? ? ?Social History  ? ?Socioeconomic History  ? Marital status: Single  ?Tobacco Use  ? Smoking status: Never  ? Smokeless tobacco: Never  ?Substance and Sexual Activity  ? Alcohol use: Never  ? Drug use: Never  ? ?############################################################ ? ?Fraility Risk: ? ?Review of Systems: ?A complete review of systems (ROS) was obtained from the patient. I have reviewed this information and discussed as appropriate with the patient. See HPI as well for other pertinent ROS. ? ?Constitutional: No fevers, chills, sweats. Weight stable ?Eyes: No vision changes, No discharge ?HENT: No sore throats, nasal drainage ?Lymph: No neck swelling, No bruising easily ?Pulmonary: No cough, productive sputum ?CV: No orthopnea, PND Patient walks 60 minutes for about 2 miles without difficulty. No exertional chest/neck/shoulder/arm pain. ? ?GI: No personal nor family history of GI/colon cancer, inflammatory bowel disease, allergy such as Celiac Sprue, dietary problems (EXCEPT DAIRY INTOLERANCE, colitis, ulcers nor gastritis. No recent sick contacts/gastroenteritis. No travel outside the country. No changes in diet. ? ?Renal: No UTIs, No hematuria ?Genital: No drainage, bleeding, masses ?Musculoskeletal: No severe joint pain. Good ROM major joints ?Skin: No sores or lesions ?Heme/Lymph: No easy bleeding. No swollen lymph nodes ? ?Objective:  ? ?Vitals:  ?04/23/21 1454  ?BP: 120/80  ?Pulse: 102  ?Temp: 36.5 ?C (97.7 ?F)  ?SpO2: 98%  ?Weight: 85.9 kg (189 lb 6.4 oz)  ?Height: 171.5 cm (5' 7.5")  ? ? ?Body mass index is  29.23 kg/m?. ? ?PHYSICAL EXAM: ? ?Constitutional: Not cachectic. Hygeine adequate. Vitals signs as above.  ?Eyes: Pupils reactive, normal extraocular movements. Sclera nonicteric ?Neuro: CN II-XII intact. No major focal sensory defects. No major motor deficits. ?Lymph: No head/neck/groin lymphadenopathy ?Psych: No severe agitation. No severe anxiety. Judgment & insight Adequate, Oriented x4, ?HENT: Normocephalic, Mucus membranes moist. No thrush.  ?Neck: Supple, No tracheal deviation. No obvious thyromegaly ?Chest: No pain to chest wall compression. Good respiratory excursion. No audible wheezing ?CV: Pulses intact. Regular rhythm. No major extremity edema ? ?Abdomen: Obese Hernia: Not present. Diastasis recti: Not present. ?Soft. Nondistended. Nontender. No hepatomegaly. No splenomegaly ? ?Gen: Inguinal hernia: Not present. Inguinal lymph nodes: without lymphadenopathy.  ? ?Rectal: (Deferred) ? ?Ext: No obvious deformity or contracture. Edema: Not present. No cyanosis ?Skin: No major subcutaneous nodules. Warm and dry ?Musculoskeletal: Severe joint rigidity not present. No obvious clubbing. No digital petechiae.  ? ?Labs, Imaging and Diagnostic Testing: ? ?Located in [place]' section of Epic EMR chart ? ?PRIOR NOTES  ? ?Marjorie Smolder ?Appointment: 08/26/2020 9:00 AM ?Location: [facility] ?Patient #: [phone] ?DOB: [birth year] ?Single / Language: English / Race: White ?Female ? ?History of Present Illness Stark Klein MD; 08/26/2020 9:53 AM) ?The patient is a 71 year old female who presents with diverticulitis. Pt is a 71 yo F  referred by [facility] for a diagnosis of sigmoid diverticulitis.  She started having issues 06/2020 with LLQ abdominal pain.  She took two rounds of antibiotics.  She is improved, but still has mild 3/10 LLQ pain that never completely goes away.  The pain is worse when she bends over.  She has some constipation and bloating, but no fever or chills.  She also hasn't had any  diarrhea.  There is a question of a fistula from the sigmoid to the sigmoid on CT.  She had a colonoscopy 02/2020 that showed diverticulosis, but no polyps or tumors.  this was done at [facility].  She had

## 2021-05-30 NOTE — Op Note (Signed)
NAME: Amy Glenn, BOEH. ?MEDICAL RECORD NO: [id number] ?ACCOUNT NO: [id number] ?DATE OF BIRTH: December 24, 1950 ?FACILITY: [facility] ?LOCATION: [facility] ?PHYSICIAN: Alexis Frock, MD ? ?Operative Report  ? ?DATE OF PROCEDURE: 05/30/2021 ? ?PREOPERATIVE DIAGNOSIS:  Severe diverticulitis. ? ?PROCEDURES PERFORMED:   ?1.  Cystoscopy with bilateral retrograde pyelograms interpretation. ?2.  Injection of bilateral indocyanine green dye for ureteral marking. ? ?ESTIMATED BLOOD LOSS:  Nil. ? ?COMPLICATIONS:  None. ? ?SPECIMEN:  None. ? ?FINDINGS:   ?1.  Unremarkable bilateral retrograde pyelograms. ?2.  Unremarkable urinary bladder. ? ?INDICATIONS:  The patient is a pleasant 71 year old lady with history of recurrent diverticulitis soon requiring hospital admission. She is undergoing segmental colon resection today under the care of the general surgery team who has asked for bilateral  ?retrograde ureteral marking for identification during her colon resection.  Her imaging was reviewed.  She does have significant inflammatory response, especially on the left side of the deep pelvis near the area of the presumed course of the ureter and  ?clearly felt this was warranted.  Informed consent was obtained and placed in medical record. ? ?PROCEDURE IN DETAIL:  The patient being verified, procedure being cystoscopy, bilateral retrogrades and ICG injection was confirmed.  Procedure timeout was performed.  Intravenous antibiotics were administered.  General anesthesia was induced.  The  ?patient was placed into a low lithotomy position.  Sterile field was created, prepped and draped the patient's vagina, introitus, and proximal thighs using iodine.  Cystourethroscopy was performed using 21-French rigid cystoscope with offset lens.   ?Inspection of urinary bladder revealed no diverticula, calcifications or papillary lesions.  Ureteral orifices appeared single.  The right ureteral orifice was cannulated with a 6-French end-hole catheter and  right retrograde pyelogram was obtained. ? ?Right retrograde pyelogram demonstrated single right ureter, single system right kidney.  No filling defects or narrowing noted.  This was performed using a slurry of contrast and ICG dye.  This ensuring ICG dye all the way to the level of the kidney for ? ureteral marking, similarly left retrograde pyelogram was obtained. ? ?Left retrograde pyelogram demonstrated single left ureter, single system left kidney.  No filling defects or narrowing noted.  Similarly, this was used with 50:50 slurry of contrast and ICG. Purposeful gentle retrograde was performed to prevent pyelovenous ?backflow given a history of mild contrast allergy.  Foley catheter was then placed per urethra to straight drain, 10 mL sterile water in the balloon and the procedure was terminated.  The patient tolerated the procedure well, no immediate perioperative  ?complications.  The patient remains in the OR for the remainder of her surgery today under the care of the general surgery team. ? ? ?PUS ?D: 05/30/2021 1:04:41 pm T: 05/30/2021 2:11:00 pm  ?JOB: [id number]/ [id number]  ?

## 2021-05-30 NOTE — Anesthesia Procedure Notes (Signed)
Procedure Name: Intubation ?Date/Time: 05/30/2021 12:32 PM ?Performed by: [place] Damaris Geers C, CRNA ?Pre-anesthesia Checklist: Patient identified, Emergency Drugs available, Suction available and Patient being monitored ?Patient Re-evaluated:Patient Re-evaluated prior to induction ?Oxygen Delivery Method: Circle system utilized ?Preoxygenation: Pre-oxygenation with 100% oxygen ?Induction Type: IV induction ?Ventilation: Mask ventilation without difficulty ?Laryngoscope Size: Mac and 3 ?Grade View: Grade I ?Tube type: Oral ?Tube size: 7.0 mm ?Number of attempts: 1 ?Airway Equipment and Method: Stylet and Oral airway ?Placement Confirmation: ETT inserted through vocal cords under direct vision, positive ETCO2 and breath sounds checked- equal and bilateral ?Secured at: 21 cm ?Tube secured with: Tape ?Dental Injury: Teeth and Oropharynx as per pre-operative assessment  ? ? ? ? ?

## 2021-05-30 NOTE — Consult Note (Signed)
[facility] Nurse requested for preoperative stoma site marking ? ?Patient marked by [place] on 05/23/21. Patient will be seen post-op for ostomy needs. ?Val Riles, RN, MSN, CWOCN, CNS-BC, pager [phone]  ?

## 2021-05-30 NOTE — Anesthesia Postprocedure Evaluation (Signed)
Anesthesia Post Note ? ?Patient: Amy Glenn ? ?Procedure(s) Performed: XI ROBOTIC ASSISTED LOWER ANTERIOR RESECTION OF COLON RECTOSIGMOID, INTRAOPERATIVE ASSESSMENT OF PERFUSION USING FIREFLY, TAP BLOCK, AND RIGID PROCTOSCOPY ?CYSTOSCOPY with FIREFLY INJECTION ? ?  ? ?Patient location during evaluation: PACU ?Anesthesia Type: General ?Level of consciousness: awake ?Pain management: pain level controlled ?Vital Signs Assessment: post-procedure vital signs reviewed and stable ?Respiratory status: spontaneous breathing, nonlabored ventilation, respiratory function stable and patient connected to nasal cannula oxygen ?Cardiovascular status: blood pressure returned to baseline and stable ?Postop Assessment: no apparent nausea or vomiting ?Anesthetic complications: no ? ? ?No notable events documented. ? ?Last Vitals:  ?Vitals:  ? 05/30/21 1559 05/30/21 1703  ?BP: (!) 147/93 129/80  ?Pulse: 84 88  ?Resp: 17 15  ?Temp: (!) 36.4 ?C   ?SpO2: 97% 99%  ?  ?Last Pain:  ?Vitals:  ? 05/30/21 1648  ?TempSrc:   ?PainSc: 5   ? ? ?  ?  ?  ?  ?  ?  ? ?Amy Glenn ? ? ? ? ?

## 2021-05-30 NOTE — Transfer of Care (Signed)
Immediate Anesthesia Transfer of Care Note ? ?Patient: KATANYA SCHLIE ? ?Procedure(s) Performed: XI ROBOTIC ASSISTED LOWER ANTERIOR RESECTION OF COLON RECTOSIGMOID, INTRAOPERATIVE ASSESSMENT OF PERFUSION USING FIREFLY, TAP BLOCK, AND RIGID PROCTOSCOPY ?CYSTOSCOPY with FIREFLY INJECTION ? ?Patient Location: PACU ? ?Anesthesia Type:General ? ?Level of Consciousness: sedated ? ?Airway & Oxygen Therapy: Patient Spontanous Breathing and Patient connected to face mask oxygen ? ?Post-op Assessment: Report given to RN and Post -op Vital signs reviewed and stable ? ?Post vital signs: Reviewed and stable ? ?Last Vitals:  ?Vitals Value Taken Time  ?BP    ?Temp    ?Pulse 76 05/30/21 1458  ?Resp 18 05/30/21 1458  ?SpO2 93 % 05/30/21 1458  ?Vitals shown include unvalidated device data. ? ?Last Pain:  ?Vitals:  ? 05/30/21 0923  ?TempSrc: Oral  ?   ? ?  ? ?Complications: No notable events documented. ?

## 2021-05-30 NOTE — H&P (Signed)
Amy Glenn is an 71 y.o. female.   ? ?Chief Complaint: Pre-OP Cysto, Bilateral Retrogrades / Ureteral Marking + Stents ? ?HPI:  ? ?1 - Recurrent Diverticulitis - several hospitalizations for diverticulitis undergoing segmental colon resection today. CT 2023 with single ureters. General surgery team requests peri-op ureteral marking. ? ?Today "Amy Glenn" is seen for cysto, retrogrades/ICG, Ureteral stents as part of management of recurrent diverticulitis.  ? ?Past Medical History:  ?Diagnosis Date  ? Arthritis   ? Chronic bronchitis ([place])   ? Depression   ? GERD (gastroesophageal reflux disease)   ? Hypothyroidism   ? Wears hearing aid   ? both ears  ? ? ?Past Surgical History:  ?Procedure Laterality Date  ? APPENDECTOMY    ? CHOLECYSTECTOMY  03/16/1980  ? DILATION AND CURETTAGE OF UTERUS  03/16/2001  ? fibroids  ? ECTOPIC PREGNANCY SURGERY  1979x2  ? on rt  ? ECTOPIC PREGNANCY SURGERY  03/16/1978  ? x2 left  ? EXPLORATORY LAPAROTOMY  10/15/1975  ? for fertility  ? HERNIA REPAIR  03/16/1993  ? lt [place]  ? KNEE ARTHROSCOPY Left 07/12/2012  ? Procedure: LEFT KNEE ARTHROSCOPY ;  Surgeon: Hessie Dibble, MD;  Location: [facility];  Service: Orthopedics;  Laterality: Left;  Left knee arthroscopy with partial medial menisectomy and chondroplasty  ? LAPAROSCOPIC ABDOMINAL EXPLORATION  03/16/1976  ? repair fall tube scarring fertility  ? REPLACEMENT UNICONDYLAR JOINT KNEE Right 2010  ? REPLACEMENT UNICONDYLAR JOINT KNEE Left 2008  ? SHOULDER ARTHROSCOPY  03/17/2003  ? left  ? ? ?Family History  ?Problem Relation Age of Onset  ? Hypertension Sister   ? ?Social History:  reports that she has never smoked. She has never used smokeless tobacco. She reports current alcohol use. She reports that she does not use drugs. ? ?Allergies: No Known Allergies ? ?No medications prior to admission.  ? ? ?Results for orders placed or performed during the hospital encounter of 05/28/21 (from the past 48 hour(s))  ?SARS  CORONAVIRUS 2 (TAT 6-24 HRS) Nasopharyngeal Nasopharyngeal Swab     Status: None  ? Collection Time: 05/28/21 12:26 PM  ? Specimen: Nasopharyngeal Swab  ?Result Value Ref Range  ? SARS Coronavirus 2 NEGATIVE NEGATIVE  ?  Comment: (NOTE) ?SARS-CoV-2 target nucleic acids are NOT DETECTED. ? ?The SARS-CoV-2 RNA is generally detectable in upper and lower ?respiratory specimens during the acute phase of infection. Negative ?results do not preclude SARS-CoV-2 infection, do not rule out ?co-infections with other pathogens, and should not be used as the ?sole basis for treatment or other patient management decisions. ?Negative results must be combined with clinical observations, ?patient history, and epidemiological information. The expected ?result is Negative. ? ?Fact Sheet for Patients: ?SugarRoll.be ? ?Fact Sheet for Healthcare Providers: ?https://www.woods-mathews.com/ ? ?This test is not yet approved or cleared by the [place] FDA and  ?has been authorized for detection and/or diagnosis of SARS-CoV-2 by ?FDA under an Emergency Use Authorization (EUA). This EUA will remain  ?in effect (meaning this test can be used) for the duration of the ?COVID-19 declaration under Se ction 564(b)(1) of the Act, 21 U.S.C. ?section 360bbb-3(b)(1), unless the authorization is terminated or ?revoked sooner. ? ?Performed at [facility], [place] [address] [place], [place] ?[address] ?  ? ?No results found. ? ?Review of Systems  ?Constitutional:  Negative for chills and fever.  ?All other systems reviewed and are negative. ? ?There were no vitals taken for this visit. ?Physical Exam ?  Vitals reviewed.  ?HENT:  ?   Nose: Nose normal.  ?Eyes:  ?   Pupils: Pupils are equal, round, and reactive to light.  ?Cardiovascular:  ?   Rate and Rhythm: Normal rate.  ?Abdominal:  ?   General: Abdomen is flat.  ?Genitourinary: ?   Comments: No CVAT at present ?Musculoskeletal:     ?   General: Normal range  of motion.  ?   Cervical back: Normal range of motion.  ?Neurological:  ?   General: No focal deficit present.  ?   Mental Status: She is alert.  ?Psychiatric:     ?   Mood and Affect: Mood normal.  ?  ? ?Assessment/Plan ? ?Proceed as planned with cysto, retrogrades/ICG, ureteral stents for ureteral marking / protection as part of management for diverticulitis. Risks, benefits, peri-op course discussed.  ? ?Alexis Frock, MD ?05/30/2021, 8:30 AM ? ? ? ?

## 2021-05-30 NOTE — Op Note (Signed)
05/30/2021 ? ?2:48 PM ? ?PATIENT:  Amy Glenn  71 y.o. female ? ?Patient Care Team: ?London Pepper, MD as PCP - General (Family Medicine) ?Michael Boston, MD as Consulting Physician (General Surgery) ?Arta Silence, MD as Consulting Physician (Gastroenterology) ?Jettie Booze, MD as Consulting Physician (Cardiology) ?Gwenlyn Saran, NP as Nurse Practitioner (Gastroenterology) ? ?PRE-OPERATIVE DIAGNOSIS:  RECURRENT SIGMOID DIVERTICULITIS ? ?POST-OPERATIVE DIAGNOSIS:  RECURRENT SIGMOID DIVERTICULITIS ? ?PROCEDURE:   ?-XI ROBOTIC ASSISTED LOWER ANTERIOR RECTOSIGMOID,RESECTION  ?-INTRAOPERATIVE ASSESSMENT OF TISSUE VASCULAR PERFUSION USING ICG (indocyanine green) IMMUNOFLUORESCENCE ?-TRANSVERSUS ABDOMINIS PLANE (TAP) BLOCK - BILATERAL ?-RIGID PROCTOSCOPY ? ?SURGEON:  Adin Hector, MD ? ?ASSISTANT: Nadeen Landau, MD ?An experienced assistant was required given the standard of surgical care given the complexity of the case.  This assistant was needed for exposure, dissection, suction, tissue approximation, retraction, perception, etc. ? ?ANESTHESIA:    ? ?General ? ?Regional TRANSVERSUS ABDOMINIS PLANE (TAP) nerve block for perioperative & postoperative pain control provided with liposomal bupivacaine (Experel) mixed with 0.25% bupivacaine as a Bilateral TAP block x 55m each side at the level of the transverse abdominis & preperitoneal spaces along the flank at the anterior axillary line, from subcostal ridge to iliac crest under laparoscopic guidance  ? ?Local field block at port sites & extraction wound ? ?EBL:  Total I/O ?In: 1700 [I.V.:1700] ?Out: 300 [Urine:300] ? ?Delay start of Pharmacological VTE agent (>24hrs) due to surgical blood loss or risk of bleeding:  no ? ?DRAINS: No ? ?SPECIMEN:   ?RECTOSIGMOID COLON (open end proximal) ?DISTAL ANASTOMOTIC RING (final distal margin) ? ?DISPOSITION OF SPECIMEN:  PATHOLOGY ? ?COUNTS:  YES ? ?PLAN OF CARE: Admit to inpatient  ? ?PATIENT DISPOSITION:   PACU - hemodynamically stable. ? ?INDICATION:   ? ?Pleasant woman with recurrent episodes of sigmoid diverticulitis with chronic left lower quadrant discomfort.  I recommended segmental resection: ? ?The anatomy & physiology of the digestive tract was discussed.  The pathophysiology was discussed.  Natural history risks without surgery was discussed.   I worked to give an overview of the disease and the frequent need to have multispecialty involvement.  I feel the risks of no intervention will lead to serious problems that outweigh the operative risks; therefore, I recommended a partial colectomy to remove the pathology.  Laparoscopic & open techniques were discussed.   ?Risks such as bleeding, infection, abscess, leak, reoperation, possible ostomy, hernia, heart attack, death, and other risks were discussed.  I noted a good likelihood this will help address the problem.   Goals of post-operative recovery were discussed as well.  We will work to minimize complications.  Educational materials on the pathology had been given in the office.  Questions were answered.   ? ?The patient expressed understanding & wished to proceed with surgery. ? ?OR FINDINGS:  ? ?Patient had chronically inflamed sigmoid colon with adhesions to the left anterior pelvis. ? ?No obvious metastatic disease on visceral parietal peritoneum or liver. ? ?The anastomosis rests 13 cm from the anal verge by rigid proctoscopy. ? ?CASE DATA: ? ?Type of patient?: Elective [facility] Private Case ? ?Status of Case? Elective Scheduled ? ?Infection Present At Time Of Surgery (PATOS)?  PHLEGMON ? ?DESCRIPTION:  ? ?Informed consent was confirmed.  The patient underwent general anaesthesia without difficulty.  The patient was positioned appropriately.  VTE prevention in place.  The patient with a current sigmoid diverticulitis with inflammation along the left retroperitoneum and pelvis along the ureter, I recommended firefly.  Patient underwent  cystoscopic firefly  injection of ureters and bladder by Dr. Alexis Frock with [facility].  Please see his separate operative report.  The patient was clipped, prepped, & draped in a sterile fashion.  Surgical timeout confirmed our plan. ? ?The patient was positioned in reverse Trendelenburg.  Abdominal entry was gained using Varess technique at the left subcostal ridge on the anterior abdominal wall.  No elevated EtCO2 noted.  Port placed.  Camera inspection revealed no injury.  Extra ports were carefully placed under direct laparoscopic visualization.  Patient had moderate omental adhesions to the upper abdomen.  These were carefully freed off using focused sharp and blunt dissection. Upon entering the abdomen (organ space), I encountered a phlegmon involving the sigmoid colon .   I reflected the greater omentum and the upper abdomen the small bowel in the upper abdomen.  The patient was carefully positioned.  The [employer] daVinci robot was docked with camera & instruments carefully placed. ? ?There were moderate adhesions of the torturous sigmoid colon to the left anterior pelvis uterus and adnexa.  Interestingly more right-sided.  We carefully freed that off to help straighten out and untwist the rectosigmoid colon and a little bit better.  I mobilized the rectosigmoid colon & elevated it to put the main pedicle on tension.  I scored the base of peritoneum of the medial side of the mesentery of the elevated left colon from the ligament of Treitz to the mid rectum.   I elevated the sigmoid mesentery and entered into the retro-mesenteric plane. We were able to identify the left ureter and gonadal vessels. We kept those posterior within the retroperitoneum and elevated the left colon mesentery off that. I did isolate the inferior mesenteric artery (IMA) pedicle but did not ligate it yet.  I continued distally and got into the avascular plane posterior to the mesorectum, sparing the nervi ergentes.. This allowed me to help  mobilize the rectum as well by freeing the mesorectum off the sacrum.  I stayed away from the right and left ureters.  I kept the lateral vascular pedicles to the rectum intact. ? ? I skeletonized the lymph nodes off the inferior mesenteric artery pedicle.  I went down to its takeoff from the aorta.   I isolated the inferior mesenteric vein off of the ligament of Treitz just cephalad to that as well.  After confirming the left ureter was out of the way, I went ahead and ligated the inferior mesenteric artery pedicle just near its takeoff from the aorta.  I did ligate the inferior mesenteric vein proximally in a similar fashion.  We ensured hemostasis.  I continued medial to lateral dissection to free the left colon mesentery off the retroperitoneum going up towards the splenic flexure to allow good mobility and protect the colon mesentery.  Able to keep the left kidney and ureter along with gonadal vessels in the retroperitoneal position.  I mobilized the left colon in a lateral to medial fashion off the retroperitoneum and sidewall attachments along the line of Toldt up towards the splenic flexure to ensure good mobilization of the remaining left colon to reach into the pelvis.  She had a rather stretched out narrow mesentery with significant mobility. ? ?We then focused on mesorectal dissection.  Freed the mesorectum off the presacral plane until I was distal to the concerning region.  Freed off peritoneum on the lateral sidewalls as well and transected the mesentery of the lateral pedicles to get distal to the area of concern.  Came around anteriorly such that I had good circumferential mesorectal excision and a good margin distal to the area of concern.  I chose a region at the mid descending junction that was soft and easily reached down to the rectal stump.  I transected the mesentery of the colon radially to preserve remaining colon blood supply.  I skeletonized the mesorectum. ? ?To access vascular perfusion  of tissues, we asked anesthesia use intravenous  indocyanine green (ICG) with IV flush.  I switched to the NIR fluorescence (Firefly mode) imaging window on the daVinci robot platform.  We were able to see

## 2021-05-30 NOTE — Discharge Instructions (Signed)
SURGERY: POST OP INSTRUCTIONS ?(Surgery for small bowel obstruction, colon resection, etc) ? ? ?###################################################################### ? ?EAT ?Gradually transition to a high fiber diet with a fiber supplement over the next few days after discharge ? ?WALK ?Walk an hour a day.  Control your pain to do that.   ? ?CONTROL PAIN ?Control pain so that you can walk, sleep, tolerate sneezing/coughing, go up/down stairs. ? ?HAVE A BOWEL MOVEMENT DAILY ?Keep your bowels regular to avoid problems.  OK to try a laxative to override constipation.  OK to use an antidairrheal to slow down diarrhea.  Call if not better after 2 tries ? ?CALL IF YOU HAVE PROBLEMS/CONCERNS ?Call if you are still struggling despite following these instructions. ?Call if you have concerns not answered by these instructions ? ?###################################################################### ? ? ?DIET ?Follow a light diet the first few days at home.  Start with a bland diet such as soups, liquids, starchy foods, low fat foods, etc.  If you feel full, bloated, or constipated, stay on a ful liquid or pureed/blenderized diet for a few days until you feel better and no longer constipated. ?Be sure to drink plenty of fluids every day to avoid getting dehydrated (feeling dizzy, not urinating, etc.). ?Gradually add a fiber supplement to your diet over the next week.  Gradually get back to a regular solid diet.  Avoid fast food or heavy meals the first week as you are more likely to get nauseated. ?It is expected for your digestive tract to need a few months to get back to normal.  It is common for your bowel movements and stools to be irregular.  You will have occasional bloating and cramping that should eventually fade away.  Until you are eating solid food normally, off all pain medications, and back to regular activities; your bowels will not be normal. ?Focus on eating a low-fat, high fiber diet the rest of your life  (See Getting to [place], below). ? ?CARE of your INCISION or WOUND ?It is good for closed incision and even open wounds to be washed every day.  Shower every day.  Short baths are fine.  Wash the incisions and wounds clean with soap & water.    ? ?If you have a closed incision(s), wash the incision with soap & water every day.  You may leave closed incisions open to air if it is dry.   You may cover the incision with clean gauze & replace it after your daily shower for comfort. ? ?It is good for closed incisions and even open wounds to be washed every day.  Shower every day.  Short baths are fine.  Wash the incisions and wounds clean with soap & water.    ?You may leave closed incisions open to air if it is dry.   You may cover the incision with clean gauze & replace it after your daily shower for comfort. ? ?TEGADERM:  You have clear gauze band-aid dressings over your closed incision(s).  Remove the dressings 3 days after surgery. = 3/20 ? ? ?If you have an open wound with a wound vac, see wound vac care instructions. ? ? ? ? ?ACTIVITIES as tolerated ?Start light daily activities --- self-care, walking, climbing stairs-- beginning the day after surgery.  Gradually increase activities as tolerated.  Control your pain to be active.  Stop when you are tired.  Ideally, walk several times a day, eventually an hour a day.   ?Most people are back to most day-to-day  activities in a few weeks.  It takes 4-8 weeks to get back to unrestricted, intense activity. ?If you can walk 30 minutes without difficulty, it is safe to try more intense activity such as jogging, treadmill, bicycling, low-impact aerobics, swimming, etc. ?Save the most intensive and strenuous activity for last (Usually 4-8 weeks after surgery) such as sit-ups, heavy lifting, contact sports, etc.  Refrain from any intense heavy lifting or straining until you are off narcotics for pain control.  You will have off days, but things should improve  week-by-week. ?DO NOT PUSH THROUGH PAIN.  Let pain be your guide: If it hurts to do something, don't do it.  Pain is your body warning you to avoid that activity for another week until the pain goes down. ?You may drive when you are no longer taking narcotic prescription pain medication, you can comfortably wear a seatbelt, and you can safely make sudden turns/stops to protect yourself without hesitating due to pain. ?You may have sexual intercourse when it is comfortable. If it hurts to do something, stop. ? ?MEDICATIONS ?Take your usually prescribed home medications unless otherwise directed.   ?Blood thinners:  ?Usually you can restart any strong blood thinners after the second postoperative day.  It is OK to take aspirin right away.    ? If you are on strong blood thinners (warfarin/Coumadin, Plavix, Xerelto, Eliquis, Pradaxa, etc), discuss with your surgeon, medicine PCP, and/or cardiologist for instructions on when to restart the blood thinner & if blood monitoring is needed (PT/INR blood check, etc).   ? ? ?PAIN CONTROL ?Pain after surgery or related to activity is often due to strain/injury to muscle, tendon, nerves and/or incisions.  This pain is usually short-term and will improve in a few months.  ?To help speed the process of healing and to get back to regular activity more quickly, DO THE FOLLOWING THINGS TOGETHER: ?Increase activity gradually.  DO NOT PUSH THROUGH PAIN ?Use Ice and/or Heat ?Try Gentle Massage and/or Stretching ?Take over the counter pain medication ?Take Narcotic prescription pain medication for more severe pain ? ?Good pain control = faster recovery.  It is better to take more medicine to be more active than to stay in bed all day to avoid medications. ? Increase activity gradually ?Avoid heavy lifting at first, then increase to lifting as tolerated over the next 6 weeks. ?Do not ?push through? the pain.  Listen to your body and avoid positions and maneuvers than reproduce the pain.   Wait a few days before trying something more intense ?Walking an hour a day is encouraged to help your body recover faster and more safely.  Start slowly and stop when getting sore.  If you can walk 30 minutes without stopping or pain, you can try more intense activity (running, jogging, aerobics, cycling, swimming, treadmill, sex, sports, weightlifting, etc.) ?Remember: If it hurts to do it, then don?t do it! ?Use Ice and/or Heat ?You will have swelling and bruising around the incisions.  This will take several weeks to resolve. ?Ice packs or heating pads (6-8 times a day, 30-60 minutes at a time) will help sooth soreness & bruising. ?Some people prefer to use ice alone, heat alone, or alternate between ice & heat.  Experiment and see what works best for you.  Consider trying ice for the first few days to help decrease swelling and bruising; then, switch to heat to help relax sore spots and speed recovery. ?Shower every day.  Short baths are fine.  It  feels good!  Keep the incisions and wounds clean with soap & water.   ?Try Gentle Massage and/or Stretching ?Massage at the area of pain many times a day ?Stop if you feel pain - do not overdo it ?Take over the counter pain medication ?This helps the muscle and nerve tissues become less irritable and calm down faster ?Choose ONE of the following over-the-counter anti-inflammatory medications: ?Acetaminophen '500mg'$  tabs (Tylenol) 1-2 pills with every meal and just before bedtime (avoid if you have liver problems or if you have acetaminophen in you narcotic prescription) ?Naproxen '220mg'$  tabs (ex. Aleve, Naprosyn) 1-2 pills twice a day (avoid if you have kidney, stomach, IBD, or bleeding problems) ?Ibuprofen '200mg'$  tabs (ex. Advil, Motrin) 3-4 pills with every meal and just before bedtime (avoid if you have kidney, stomach, IBD, or bleeding problems) ?Take with food/snack several times a day as directed for at least 2 weeks to help keep pain / soreness down & more  manageable. ?Take Narcotic prescription pain medication for more severe pain ?A prescription for strong pain control is often given to you upon discharge (for example: oxycodone/Percocet, hydrocodone/Norco/Vicodin, or

## 2021-05-30 NOTE — Anesthesia Preprocedure Evaluation (Addendum)
Anesthesia Evaluation  ?Patient identified by MRN, date of birth, ID band ?Patient awake ? ? ? ?Reviewed: ?Allergy & Precautions, NPO status , Patient's Chart, lab work & pertinent test results ? ?Airway ?Mallampati: III ? ?TM Distance: >3 FB ?Neck ROM: Full ? ? ? Dental ?no notable dental hx. ? ?  ?Pulmonary ? ?Chronic bronchitis  ?  ?Pulmonary exam normal ?breath sounds clear to auscultation ? ? ? ? ? ? Cardiovascular ?negative cardio ROS ?Normal cardiovascular exam ?Rhythm:Regular Rate:Normal ? ?ECG: ST ?  ?Neuro/Psych ?PSYCHIATRIC DISORDERS Depression negative neurological ROS ?   ? GI/Hepatic ?Neg liver ROS, Bowel prep,GERD  Medicated and Controlled,  ?Endo/Other  ?Hypothyroidism  ? Renal/GU ?negative Renal ROS  ? ?  ?Musculoskeletal ? ?(+) Arthritis ,  ? Abdominal ?  ?Peds ? Hematology ?negative hematology ROS ?(+)   ?Anesthesia Other Findings ?DIVERTICULITIS ? Reproductive/Obstetrics ? ?  ? ? ? ? ? ? ? ? ? ? ? ? ? ?  ?  ? ? ? ? ? ? ?Anesthesia Physical ?Anesthesia Plan ? ?ASA: 2 ? ?Anesthesia Plan: General  ? ?Post-op Pain Management:   ? ?Induction: Intravenous ? ?PONV Risk Score and Plan: 4 or greater and Ondansetron, Dexamethasone, Propofol infusion, Midazolam and Treatment may vary due to age or medical condition ? ?Airway Management Planned: Oral ETT ? ?Additional Equipment:  ? ?Intra-op Plan:  ? ?Post-operative Plan: Extubation in OR ? ?Informed Consent: I have reviewed the patients History and Physical, chart, labs and discussed the procedure including the risks, benefits and alternatives for the proposed anesthesia with the patient or authorized representative who has indicated his/her understanding and acceptance.  ? ? ? ?Dental advisory given ? ?Plan Discussed with: CRNA ? ?Anesthesia Plan Comments:   ? ? ? ? ? ?Anesthesia Quick Evaluation ? ?

## 2021-05-30 NOTE — Brief Op Note (Signed)
05/30/2021 ? ?1:00 PM ? ?PATIENT:  Amy Glenn  71 y.o. female ? ?PRE-OPERATIVE DIAGNOSIS:  DIVERTICULITIS ? ?POST-OPERATIVE DIAGNOSIS:  * No post-op diagnosis entered * ? ?PROCEDURE:  Cystoscopy with Bilateral Retrogrades and ICG INjection ? ?SURGEON:  Surgeon(s) and Role: ? ?   Alexis Frock, MD - Primary ? ?PHYSICIAN ASSISTANT:  ? ?ASSISTANTS: none  ? ?ANESTHESIA:   general ? ?EBL:  minimal  ? ?BLOOD ADMINISTERED:none ? ?DRAINS:  foley to gravity   ? ?LOCAL MEDICATIONS USED:  NONE ? ?SPECIMEN:  No Specimen ? ?DISPOSITION OF SPECIMEN:  N/A ? ?COUNTS:  YES ? ?TOURNIQUET:  * No tourniquets in log * ? ?DICTATION: .Other Dictation: Dictation Number [id number] ? ?PLAN OF CARE:  remain in OR for general surgery portions of procedure ? ?PATIENT DISPOSITION:  as per above ?  ?Delay start of Pharmacological VTE agent (>24hrs) due to surgical blood loss or risk of bleeding: yes ? ?

## 2021-05-31 ENCOUNTER — Encounter (HOSPITAL_COMMUNITY): Payer: Self-pay | Admitting: Surgery

## 2021-05-31 LAB — BASIC METABOLIC PANEL
Anion gap: 9 (ref 5–15)
BUN: 13 mg/dL (ref 8–23)
CO2: 26 mmol/L (ref 22–32)
Calcium: 8.3 mg/dL — ABNORMAL LOW (ref 8.9–10.3)
Chloride: 102 mmol/L (ref 98–111)
Creatinine, Ser: 0.78 mg/dL (ref 0.44–1.00)
GFR, Estimated: >60 mL/min (ref >60)
Glucose, Bld: 114 mg/dL — ABNORMAL HIGH (ref 70–99)
Potassium: 3.8 mmol/L (ref 3.5–5.1)
Sodium: 137 mmol/L (ref 135–145)

## 2021-05-31 LAB — CBC
HCT: 37.6 % (ref 36.0–46.0)
Hemoglobin: 12.4 g/dL (ref 12.0–15.0)
MCH: 29.2 pg (ref 26.0–34.0)
MCHC: 33 g/dL (ref 30.0–36.0)
MCV: 88.7 fL (ref 80.0–100.0)
Platelets: 307 10*3/uL (ref 150–400)
RBC: 4.24 MIL/uL (ref 3.87–5.11)
RDW: 13.8 % (ref 11.5–15.5)
WBC: 11.5 10*3/uL — ABNORMAL HIGH (ref 4.0–10.5)
nRBC: 0 % (ref 0.0–0.2)

## 2021-05-31 LAB — MAGNESIUM: Magnesium: 2 mg/dL (ref 1.7–2.4)

## 2021-05-31 NOTE — Progress Notes (Signed)
IV removed from left hand without difficulty noted. Patient voided 11:35am. Foley cath removed prior shift. No pain on urination noted. ?

## 2021-05-31 NOTE — Progress Notes (Signed)
? ? ?  Assessment & Plan: ?71D#1 - status post robotic LAR for diverticular disease - Dr. Johney Maine ? Full liquid diet ? Ambulating ? Pain controlled ? ?Doing well post op.  Encouraged ambulation, OOB.  Likely home tomorrow. ? ?      Armandina Gemma, MD ?      [facility] ?      Office: [phone] ? ? ?Chief Complaint: ?Diverticular disease ? ?Subjective: ?Patient in bed, comfortable, no complaints.  Full liquid diet. ? ?Objective: ?Vital signs in last 24 hours: ?Temp:  [97.5 ?F (36.4 ?C)-98.6 ?F (37 ?C)] 98.6 ?F (37 ?C) (03/18 0500) ?Pulse Rate:  [77-100] 98 (03/18 0500) ?Resp:  [10-18] 18 (03/18 0500) ?BP: (96-151)/(58-96) 106/67 (03/18 0500) ?SpO2:  [91 %-99 %] 94 % (03/18 0500) ?Last BM Date : 05/31/21 ? ?Intake/Output from previous day: ?03/17 0701 - 03/18 0700 ?In: 2959.7 [P.O.:720; I.V.:2139.7; IV Piggyback:100] ?Out: 1600 [Urine:1600] ?Intake/Output this shift: ?No intake/output data recorded. ? ?Physical Exam: ?HEENT - sclerae clear, mucous membranes moist ?Neck - soft ?Abdomen - soft, mild distension, minimal tenderness; dressings dry and intact ?Ext - no edema, non-tender ?Neuro - alert & oriented, no focal deficits ? ?Lab Results:  ?Recent Labs  ?  05/31/21 ?0404  ?WBC 11.5*  ?HGB 12.4  ?HCT 37.6  ?PLT 307  ? ?BMET ?Recent Labs  ?  05/31/21 ?0404  ?NA 137  ?K 3.8  ?CL 102  ?CO2 26  ?GLUCOSE 114*  ?BUN 13  ?CREATININE 0.78  ?CALCIUM 8.3*  ? ?PT/INR ?No results for input(s): LABPROT, INR in the last 72 hours. ?Comprehensive Metabolic Panel: ?   ?Component Value Date/Time  ? NA 137 05/31/2021 0404  ? NA 136 04/16/2021 2056  ? K 3.8 05/31/2021 0404  ? K 4.2 04/16/2021 2056  ? CL 102 05/31/2021 0404  ? CL 103 04/16/2021 2056  ? CO2 26 05/31/2021 0404  ? CO2 24 04/16/2021 2056  ? BUN 13 05/31/2021 0404  ? BUN 11 04/16/2021 2056  ? CREATININE 0.78 05/31/2021 0404  ? CREATININE 0.77 04/16/2021 2056  ? GLUCOSE 114 (H) 05/31/2021 0404  ? GLUCOSE 123 (H) 04/16/2021 2056  ? CALCIUM 8.3 (L) 05/31/2021  0404  ? CALCIUM 8.4 (L) 04/16/2021 2056  ? AST 28 04/15/2021 2219  ? AST 34 01/29/2021 0345  ? ALT 42 04/15/2021 2219  ? ALT 56 (H) 01/29/2021 0345  ? ALKPHOS 52 04/15/2021 2219  ? ALKPHOS 55 01/29/2021 0345  ? BILITOT 0.5 04/15/2021 2219  ? BILITOT 0.4 01/29/2021 0345  ? PROT 6.9 04/15/2021 2219  ? PROT 6.8 01/29/2021 0345  ? ALBUMIN 3.5 04/15/2021 2219  ? ALBUMIN 3.9 01/29/2021 0345  ? ? ?Studies/Results: ?DG C-Arm 1-60 Min-No Report ? ?Result Date: 05/30/2021 ?Fluoroscopy was utilized by the requesting physician.  No radiographic interpretation.   ? ? ? ?Armandina Gemma ?05/31/2021 ? ? Patient ID: ANNICK DIMAIO, female   DOB: 07/13/1950, 71 y.o.   MRN: [id number] ? ?

## 2021-06-01 MED ORDER — TRAMADOL HCL 50 MG PO TABS: 50.0000 mg | ORAL_TABLET | Freq: Four times a day (QID) | ORAL | 0 refills | Status: DC | PRN

## 2021-06-01 NOTE — Plan of Care (Signed)

## 2021-06-01 NOTE — Progress Notes (Signed)
Assessment unchanged. Pt verbalized understanding of dc instructions including medications. Discharged via wc to meet ride at front entrance. Accompanied by NT.  ?

## 2021-06-01 NOTE — Progress Notes (Signed)
Patient ID: Amy Glenn, female   DOB: Oct 26, 1950, 71 y.o.   MRN: [id number] ? ?Doing well ?Having BM's ?Abdomen soft ?Wants to go home ? ?Plan: ?Discharge home ?

## 2021-06-02 ENCOUNTER — Other Ambulatory Visit (HOSPITAL_COMMUNITY): Payer: Self-pay

## 2021-06-02 LAB — SURGICAL PATHOLOGY

## 2021-06-02 NOTE — Discharge Summary (Signed)
Physician Discharge Summary    Patient ID: Amy Glenn MRN: [id number] DOB/AGE: 70/07/1950  70 y.o.  Patient Care Team: Farris Has, MD as PCP - General (Family Medicine) Karie Soda, MD as Consulting Physician (General Surgery) Willis Modena, MD as Consulting Physician (Gastroenterology) Corky Crafts, MD as Consulting Physician (Cardiology) Rexanne Mano, NP as Nurse Practitioner (Gastroenterology)  Admit date: 05/30/2021  Discharge date: 06/01/2021 Hospital Stay = 2 days    Discharge Diagnoses:  Principal Problem:   Sigmoid diverticulitis   3 Days Post-Op  05/30/2021  POST-OPERATIVE DIAGNOSIS:   RECURRENT SIGMOID DIVERTICULITIS  SURGERY:  05/30/2021  Procedure(s): XI ROBOTIC ASSISTED LOWER ANTERIOR RESECTION OF COLON RECTOSIGMOID, INTRAOPERATIVE ASSESSMENT OF PERFUSION USING FIREFLY, TAP BLOCK, AND RIGID PROCTOSCOPY CYSTOSCOPY with FIREFLY INJECTION  SURGEON:    Surgeon(s): Karie Soda, MD Andria Meuse, MD Sebastian Ache, MD  Consults: Case Management / Social Work  Hospital Course:   The patient underwent  the surgery above.  Postoperatively, the patient gradually mobilized and advanced to a solid diet.  Pain and other symptoms were treated aggressively.    By the time of discharge, the patient was walking well the hallways, eating food, having flatus.  Pain was well-controlled on an oral medications.  Based on meeting discharge criteria and continuing to recover, I felt it was safe for the patient to be discharged from the hospital to further recover with close followup. Postoperative recommendations were discussed in detail.  They are written as well.  Discharged Condition: Good  Discharge Exam: Blood pressure 122/84, pulse 89, temperature 97.6 F (36.4 C), temperature source Oral, resp. rate 16, SpO2 95 %.  General: Pt awake/alert/oriented x4 in No acute distress Eyes: PERRL, normal EOM.  Sclera clear.  No  icterus Neuro: CN II-XII intact w/o focal sensory/motor deficits. Lymph: No head/neck/groin lymphadenopathy Psych:  No delerium/psychosis/paranoia HENT: Normocephalic, Mucus membranes moist.  No thrush Neck: Supple, No tracheal deviation Chest:  No chest wall pain w good excursion CV:  Pulses intact.  Regular rhythm MS: Normal AROM mjr joints.  No obvious deformity Abdomen: Soft.  Nondistended.  Nontender.  No evidence of peritonitis.  No incarcerated hernias. Ext:  SCDs BLE.  No mjr edema.  No cyanosis Skin: No petechiae / purpura   Disposition:    Follow-up Information     Karie Soda, MD Follow up in 3 week(s).   Specialties: General Surgery, Colon and Rectal Surgery Why: To follow up after your operation, To follow up after your hospital stay Contact information: [address] [place] [address] [phone]                 Discharge disposition: 01-Home or Self Care       Discharge Instructions     Call MD for:   Complete by: As directed    FEVER > 101.5 F  (temperatures < 101.5 F are not significant)   Call MD for:  extreme fatigue   Complete by: As directed    Call MD for:  persistant dizziness or light-headedness   Complete by: As directed    Call MD for:  persistant nausea and vomiting   Complete by: As directed    Call MD for:  redness, tenderness, or signs of infection (pain, swelling, redness, odor or green/yellow discharge around incision site)   Complete by: As directed    Call MD for:  severe uncontrolled pain   Complete by: As directed    Diet - low sodium  heart healthy   Complete by: As directed    Start with a bland diet such as soups, liquids, starchy foods, low fat foods, etc. the first few days at home. Gradually advance to a solid, low-fat, high fiber diet by the end of the first week at home.   Add a fiber supplement to your diet (Metamucil, etc) If you feel full, bloated, or constipated, stay on a full liquid or  pureed/blenderized diet for a few days until you feel better and are no longer constipated.   Discharge instructions   Complete by: As directed    See Discharge Instructions If you are not getting better after two weeks or are noticing you are getting worse, contact our office [phone] for further advice.  We may need to adjust your medications, re-evaluate you in the office, send you to the emergency room, or see what other things we can do to help. The clinic staff is available to answer your questions during regular business hours (8:30am-5pm).  Please don't hesitate to call and ask to speak to one of our nurses for clinical concerns.    A surgeon from [facility] is always on call at the hospitals 24 hours/day If you have a medical emergency, go to the nearest emergency room or call 911.   Discharge wound care:   Complete by: As directed    It is good for closed incisions and even open wounds to be washed every day.  Shower every day.  Short baths are fine.  Wash the incisions and wounds clean with soap & water.    You may leave closed incisions open to air if it is dry.   You may cover the incision with clean gauze & replace it after your daily shower for comfort.  TEGADERM:  You have clear gauze band-aid dressings over your closed incision(s).  Remove the dressings 3 days after surgery.   Driving Restrictions   Complete by: As directed    You may drive when: - you are no longer taking narcotic prescription pain medication - you can comfortably wear a seatbelt - you can safely make sudden turns/stops without pain.   Increase activity slowly   Complete by: As directed    Start light daily activities --- self-care, walking, climbing stairs- beginning the day after surgery.  Gradually increase activities as tolerated.  Control your pain to be active.  Stop when you are tired.  Ideally, walk several times a day, eventually an hour a day.   Most people are back to most  day-to-day activities in a few weeks.  It takes 4-6 weeks to get back to unrestricted, intense activity. If you can walk 30 minutes without difficulty, it is safe to try more intense activity such as jogging, treadmill, bicycling, low-impact aerobics, swimming, etc. Save the most intensive and strenuous activity for last (Usually 4-8 weeks after surgery) such as sit-ups, heavy lifting, contact sports, etc.  Refrain from any intense heavy lifting or straining until you are off narcotics for pain control.  You will have off days, but things should improve week-by-week. DO NOT PUSH THROUGH PAIN.  Let pain be your guide: If it hurts to do something, don't do it.   Lifting restrictions   Complete by: As directed    If you can walk 30 minutes without difficulty, it is safe to try more intense activity such as jogging, treadmill, bicycling, low-impact aerobics, swimming, etc. Save the most intensive and strenuous activity for last (Usually  4-8 weeks after surgery) such as sit-ups, heavy lifting, contact sports, etc.   Refrain from any intense heavy lifting or straining until you are off narcotics for pain control.  You will have off days, but things should improve week-by-week. DO NOT PUSH THROUGH PAIN.  Let pain be your guide: If it hurts to do something, don't do it.  Pain is your body warning you to avoid that activity for another week until the pain goes down.   May shower / Bathe   Complete by: As directed    May walk up steps   Complete by: As directed    Remove dressing in 72 hours   Complete by: As directed    Make sure all dressings are removed by the third day after surgery.  Leave incisions open to air.  OK to cover incisions with gauze or bandages as desired   Sexual Activity Restrictions   Complete by: As directed    You may have sexual intercourse when it is comfortable. If it hurts to do something, stop.       Allergies as of 06/01/2021   No Known Allergies      Medication List      STOP taking these medications    ciprofloxacin 500 MG tablet Commonly known as: CIPRO   HYDROcodone-acetaminophen 5-325 MG tablet Commonly known as: NORCO/VICODIN   hydrocortisone 2.5 % ointment   metroNIDAZOLE 500 MG tablet Commonly known as: FLAGYL       TAKE these medications    acidophilus Caps capsule Take 1 capsule by mouth daily.   amitriptyline 10 MG tablet Commonly known as: ELAVIL Take 10 mg by mouth at bedtime.   b complex vitamins capsule Take 1 capsule by mouth daily.   cholecalciferol 25 MCG (1000 UNIT) tablet Commonly known as: VITAMIN D3 Take 1,000 Units by mouth daily.   dorzolamide-timolol 22.3-6.8 MG/ML ophthalmic solution Commonly known as: COSOPT Place 1 drop into both eyes 2 (two) times daily.   Euthyrox 50 MCG tablet Generic drug: levothyroxine Take 50 mcg by mouth every morning.   lansoprazole 15 MG capsule Commonly known as: PREVACID Take 15 mg by mouth daily.   MAGNESIUM PO Take 1 tablet by mouth daily.   metoCLOPramide 10 MG tablet Commonly known as: REGLAN Take 1 tablet (10 mg total) by mouth every 6 (six) hours. What changed:  when to take this reasons to take this   multivitamin with minerals Tabs tablet Take 1 tablet by mouth daily.   PARoxetine 20 MG tablet Commonly known as: PAXIL Take 20 mg by mouth every morning.   POTASSIUM CITRATE PO Take 1 tablet by mouth daily.   rosuvastatin 10 MG tablet Commonly known as: CRESTOR Take 1 tablet (10 mg total) by mouth daily.   traMADol 50 MG tablet Commonly known as: ULTRAM Take 1 tablet (50 mg total) by mouth every 6 (six) hours as needed for moderate pain or severe pain.   vitamin E 180 MG (400 UNITS) capsule Take 400 Units by mouth daily.   zinc gluconate 50 MG tablet Take 50 mg by mouth daily.   zolpidem 5 MG tablet Commonly known as: AMBIEN Take 5 mg by mouth at bedtime as needed for sleep.               Discharge Care Instructions  (From  admission, onward)           Start     Ordered   05/30/21 0000  Discharge wound care:  Comments: It is good for closed incisions and even open wounds to be washed every day.  Shower every day.  Short baths are fine.  Wash the incisions and wounds clean with soap & water.    You may leave closed incisions open to air if it is dry.   You may cover the incision with clean gauze & replace it after your daily shower for comfort.  TEGADERM:  You have clear gauze band-aid dressings over your closed incision(s).  Remove the dressings 3 days after surgery.   05/30/21 1222            Significant Diagnostic Studies:  Results for orders placed or performed during the hospital encounter of 05/30/21 (from the past 72 hour(s))  Type and screen [facility]     Status: None   Collection Time: 05/30/21  9:23 AM  Result Value Ref Range   ABO/RH(D) A NEG    Antibody Screen NEG    Sample Expiration      06/02/2021,2359 Performed at [facility], [address], [place], [place] [address]   Surgical pathology     Status: None   Collection Time: 05/30/21  1:42 PM  Result Value Ref Range   SURGICAL PATHOLOGY      SURGICAL PATHOLOGY CASE: [id number] PATIENT: Lind Guest Surgical Pathology Report     Clinical History: Diverticulitis (crm)     FINAL MICROSCOPIC DIAGNOSIS:  A. COLON, RECTOSIGMOID, RESECTION: - Segment of colon (20 cm) showing diverticulosis and diverticulitis with scattered serosal adhesions - Nodular fat necrosis - Margins appear viable - Benign lymph nodes  B. FINAL DISTAL MARGIN: - Colonic donut within normal limits      [facility] Messamore DESCRIPTION:  Specimen A: Colon resection, clinically rectosigmoid, received fresh. Also 2 separate portions of fat, each with an attached well-defined cystic nodule. Length: 20 cm tortuous segment Serosa: Tan-pink to hyperemic with scattered adhesions.  There is attached soft to  rubbery mesentery, also with scattered adhesions. Contents: Scant green-brown soft material. Mucosa/Wall: The mucosa is tan-pink to hyperemic, smooth, soft with normal intestinal folds.  The wall is up to 1.9 cm thick, and has multipl e nonperforated diverticula scattered throughout the segment. There are no mass lesions. Separate cystic nodules: 1.1 and 1.6 cm.  The smaller has calcified periphery and contains yellow-brown soft friable material.  The larger contains pale yellow thick fluid and has a smooth lining. Lymph nodes: 4 possible lymph nodes are sampled from the fat. Block Summary: Block 1 = proximal margin Block 2 = distal margin Block 3 = representative diverticulum Block 4 = sections of separate nodules Block 5 = 4 possible nodes  Specimen B: Received fresh is a 1.6 cm in length and 2.2 cm in diameter ring of pink-red smooth, soft mucosa with underlying muscularis with embedded metallic staples. Representative sections are submitted in 1 block.  SW 06/01/2021    Final Diagnosis performed by Holley Bouche, MD.   Electronically signed 06/02/2021 Technical and / or Professional components performed at [facility], [address] [place], [place] [address].  Imm unohistochemistry Technical component (if applicable) was performed at [facility]. [address], [place], [place] [address].   IMMUNOHISTOCHEMISTRY DISCLAIMER (if applicable): Some of these immunohistochemical stains may have been developed and the performance characteristics determine by [facility]. Some may not have been cleared or approved by the U.S. Food and Drug Administration. The FDA has determined that such clearance or approval  is not necessary. This test is used for clinical purposes. It should not be regarded as investigational or for research. This laboratory is certified under the Clinical Laboratory Improvement Amendments of  1988 (CLIA-88) as qualified to perform high complexity clinical laboratory testing.  The controls stained appropriately.   Basic metabolic panel     Status: Abnormal   Collection Time: 05/31/21  4:04 AM  Result Value Ref Range   Sodium 137 135 - 145 mmol/L   Potassium 3.8 3.5 - 5.1 mmol/L   Chloride 102 98 - 111 mmol/L   CO2 26 22 - 32 mmol/L   Glucose, Bld 114 (H) 70 - 99 mg/dL    Comment: Glucose reference range applies only to samples taken after fasting for at least 8 hours.   BUN 13 8 - 23 mg/dL   Creatinine, Ser 1.61 0.44 - 1.00 mg/dL   Calcium 8.3 (L) 8.9 - 10.3 mg/dL   GFR, Estimated >09 >60 mL/min    Comment: (NOTE) Calculated using the CKD-EPI Creatinine Equation (2021)    Anion gap 9 5 - 15    Comment: Performed at [facility], [address], [place], [place] [address]  CBC     Status: Abnormal   Collection Time: 05/31/21  4:04 AM  Result Value Ref Range   WBC 11.5 (H) 4.0 - 10.5 K/uL   RBC 4.24 3.87 - 5.11 MIL/uL   Hemoglobin 12.4 12.0 - 15.0 g/dL   HCT 81.1 91.4 - 78.2 %   MCV 88.7 80.0 - 100.0 fL   MCH 29.2 26.0 - 34.0 pg   MCHC 33.0 30.0 - 36.0 g/dL   RDW 95.6 21.3 - 08.6 %   Platelets 307 150 - 400 K/uL   nRBC 0.0 0.0 - 0.2 %    Comment: Performed at [facility], [address] [place], [place] [address]  Magnesium     Status: None   Collection Time: 05/31/21  4:04 AM  Result Value Ref Range   Magnesium 2.0 1.7 - 2.4 mg/dL    Comment: Performed at [facility], [address], [place], [place] [address]    DG C-Arm 1-60 Min-No Report  Result Date: 05/30/2021 Fluoroscopy was utilized by the requesting physician.  No radiographic interpretation.    Past Medical History:  Diagnosis Date   Arthritis    Chronic bronchitis (HCC)    Depression    GERD (gastroesophageal reflux disease)    Hypothyroidism    Wears hearing aid    both ears    Past Surgical History:  Procedure Laterality Date    APPENDECTOMY     CHOLECYSTECTOMY  03/16/1980   DILATION AND CURETTAGE OF UTERUS  03/16/2001   fibroids   ECTOPIC PREGNANCY SURGERY  1979x2   on rt   ECTOPIC PREGNANCY SURGERY  03/16/1978   x2 left   EXPLORATORY LAPAROTOMY  10/15/1975   for fertility   HERNIA REPAIR  03/16/1993   lt LA   KNEE ARTHROSCOPY Left 07/12/2012   Procedure: LEFT KNEE ARTHROSCOPY ;  Surgeon: Velna Ochs, MD;  Location:  SURGERY CENTER;  Service: Orthopedics;  Laterality: Left;  Left knee arthroscopy with partial medial menisectomy and chondroplasty   LAPAROSCOPIC ABDOMINAL EXPLORATION  03/16/1976   repair fall tube scarring fertility   REPLACEMENT UNICONDYLAR JOINT KNEE Right 2010   REPLACEMENT UNICONDYLAR JOINT KNEE Left 2008   SHOULDER ARTHROSCOPY  03/17/2003   left   XI ROBOTIC ASSISTED LOWER ANTERIOR RESECTION N/A  05/30/2021   Procedure: XI ROBOTIC ASSISTED LOWER ANTERIOR RESECTION OF COLON RECTOSIGMOID, INTRAOPERATIVE ASSESSMENT OF PERFUSION USING FIREFLY, TAP BLOCK, AND RIGID PROCTOSCOPY;  Surgeon: Karie Soda, MD;  Location: [facility];  Service: General;  Laterality: N/A;    Social History   Socioeconomic History   Marital status: Divorced    Spouse name: Not on file   Number of children: Not on file   Years of education: Not on file   Highest education level: Not on file  Occupational History   Not on file  Tobacco Use   Smoking status: Never   Smokeless tobacco: Never  Vaping Use   Vaping Use: Never used  Substance and Sexual Activity   Alcohol use: Yes    Comment: occ   Drug use: No   Sexual activity: Not Currently  Other Topics Concern   Not on file  Social History Narrative   Not on file   Social Determinants of Health   Financial Resource Strain: Not on file  Food Insecurity: Not on file  Transportation Needs: Not on file  Physical Activity: Not on file  Stress: Not on file  Social Connections: Not on file  Intimate Partner Violence: Not on file    Family  History  Problem Relation Age of Onset   Hypertension Sister     No current facility-administered medications for this encounter.   Current Outpatient Medications  Medication Sig Dispense Refill   acidophilus (RISAQUAD) CAPS capsule Take 1 capsule by mouth daily.     amitriptyline (ELAVIL) 10 MG tablet Take 10 mg by mouth at bedtime.     b complex vitamins capsule Take 1 capsule by mouth daily.     cholecalciferol (VITAMIN D3) 25 MCG (1000 UNIT) tablet Take 1,000 Units by mouth daily.     dorzolamide-timolol (COSOPT) 22.3-6.8 MG/ML ophthalmic solution Place 1 drop into both eyes 2 (two) times daily.     EUTHYROX 50 MCG tablet Take 50 mcg by mouth every morning.     lansoprazole (PREVACID) 15 MG capsule Take 15 mg by mouth daily.     MAGNESIUM PO Take 1 tablet by mouth daily.     metoCLOPramide (REGLAN) 10 MG tablet Take 1 tablet (10 mg total) by mouth every 6 (six) hours. (Patient taking differently: Take 10 mg by mouth every 6 (six) hours as needed for vomiting.) 15 tablet 0   Multiple Vitamin (MULTIVITAMIN WITH MINERALS) TABS tablet Take 1 tablet by mouth daily.     PARoxetine (PAXIL) 20 MG tablet Take 20 mg by mouth every morning.     POTASSIUM CITRATE PO Take 1 tablet by mouth daily.     rosuvastatin (CRESTOR) 10 MG tablet Take 1 tablet (10 mg total) by mouth daily. 90 tablet 3   vitamin E 180 MG (400 UNITS) capsule Take 400 Units by mouth daily.     zinc gluconate 50 MG tablet Take 50 mg by mouth daily.     zolpidem (AMBIEN) 5 MG tablet Take 5 mg by mouth at bedtime as needed for sleep.     traMADol (ULTRAM) 50 MG tablet Take 1 tablet (50 mg total) by mouth every 6 (six) hours as needed for moderate pain or severe pain. 20 tablet 0     No Known Allergies  Signed: Lorenso Courier, MD, FACS, MASCRS Esophageal, Gastrointestinal & Colorectal Surgery Robotic and Minimally Invasive Surgery  [facility], [facility]  [facility]   [address]  [address], [address] [place], [place] [address] [phone] Fax [phone] Main  CONTACT INFORMATION:  Weekday (9AM-5PM): Call [facility] main office at [phone]  Weeknight (5PM-9AM) or Weekend/Holiday: Check [email or web address] (password " [email or web address]") for General Surgery [facility] coverage  (Please, do not use SecureChat as it is not reliable communication to operating surgeons for immediate patient care)      06/02/2021, 8:15 AM

## 2021-06-04 ENCOUNTER — Other Ambulatory Visit (HOSPITAL_COMMUNITY): Payer: Self-pay

## 2021-06-13 DIAGNOSIS — I83892 Varicose veins of left lower extremities with other complications: Secondary | ICD-10-CM | POA: Diagnosis not present

## 2021-06-18 DIAGNOSIS — N6311 Unspecified lump in the right breast, upper outer quadrant: Secondary | ICD-10-CM | POA: Diagnosis not present

## 2021-06-18 DIAGNOSIS — I700 Atherosclerosis of aorta: Secondary | ICD-10-CM | POA: Diagnosis not present

## 2021-06-18 DIAGNOSIS — F411 Generalized anxiety disorder: Secondary | ICD-10-CM | POA: Diagnosis not present

## 2021-06-18 DIAGNOSIS — Z0000 Encounter for general adult medical examination without abnormal findings: Secondary | ICD-10-CM | POA: Diagnosis not present

## 2021-06-18 DIAGNOSIS — R7309 Other abnormal glucose: Secondary | ICD-10-CM | POA: Diagnosis not present

## 2021-06-18 DIAGNOSIS — E039 Hypothyroidism, unspecified: Secondary | ICD-10-CM | POA: Diagnosis not present

## 2021-06-18 DIAGNOSIS — E785 Hyperlipidemia, unspecified: Secondary | ICD-10-CM | POA: Diagnosis not present

## 2021-06-18 DIAGNOSIS — E559 Vitamin D deficiency, unspecified: Secondary | ICD-10-CM | POA: Diagnosis not present

## 2021-06-27 DIAGNOSIS — Z09 Encounter for follow-up examination after completed treatment for conditions other than malignant neoplasm: Secondary | ICD-10-CM | POA: Diagnosis not present

## 2021-06-27 DIAGNOSIS — I83892 Varicose veins of left lower extremities with other complications: Secondary | ICD-10-CM | POA: Diagnosis not present

## 2021-06-27 DIAGNOSIS — I87392 Chronic venous hypertension (idiopathic) with other complications of left lower extremity: Secondary | ICD-10-CM | POA: Diagnosis not present

## 2021-07-01 DIAGNOSIS — M19072 Primary osteoarthritis, left ankle and foot: Secondary | ICD-10-CM | POA: Diagnosis not present

## 2021-07-01 DIAGNOSIS — M2012 Hallux valgus (acquired), left foot: Secondary | ICD-10-CM | POA: Diagnosis not present

## 2021-07-01 DIAGNOSIS — L03032 Cellulitis of left toe: Secondary | ICD-10-CM | POA: Diagnosis not present

## 2021-07-01 DIAGNOSIS — M659 Synovitis and tenosynovitis, unspecified: Secondary | ICD-10-CM | POA: Diagnosis not present

## 2021-07-01 DIAGNOSIS — L600 Ingrowing nail: Secondary | ICD-10-CM | POA: Diagnosis not present

## 2021-07-01 DIAGNOSIS — M7751 Other enthesopathy of right foot: Secondary | ICD-10-CM | POA: Diagnosis not present

## 2021-07-03 DIAGNOSIS — C44612 Basal cell carcinoma of skin of right upper limb, including shoulder: Secondary | ICD-10-CM | POA: Diagnosis not present

## 2021-07-03 DIAGNOSIS — L905 Scar conditions and fibrosis of skin: Secondary | ICD-10-CM | POA: Diagnosis not present

## 2021-07-11 DIAGNOSIS — I83892 Varicose veins of left lower extremities with other complications: Secondary | ICD-10-CM | POA: Diagnosis not present

## 2021-08-01 DIAGNOSIS — M2012 Hallux valgus (acquired), left foot: Secondary | ICD-10-CM | POA: Diagnosis not present

## 2021-08-01 DIAGNOSIS — M7751 Other enthesopathy of right foot: Secondary | ICD-10-CM | POA: Diagnosis not present

## 2021-08-01 DIAGNOSIS — L03032 Cellulitis of left toe: Secondary | ICD-10-CM | POA: Diagnosis not present

## 2021-08-01 DIAGNOSIS — M19072 Primary osteoarthritis, left ankle and foot: Secondary | ICD-10-CM | POA: Diagnosis not present

## 2021-08-01 DIAGNOSIS — L02612 Cutaneous abscess of left foot: Secondary | ICD-10-CM | POA: Diagnosis not present

## 2021-08-22 DIAGNOSIS — S29012A Strain of muscle and tendon of back wall of thorax, initial encounter: Secondary | ICD-10-CM | POA: Diagnosis not present

## 2021-08-22 DIAGNOSIS — M62830 Muscle spasm of back: Secondary | ICD-10-CM | POA: Diagnosis not present

## 2021-09-28 ENCOUNTER — Other Ambulatory Visit: Payer: Self-pay | Admitting: Interventional Cardiology

## 2021-10-02 DIAGNOSIS — R922 Inconclusive mammogram: Secondary | ICD-10-CM | POA: Diagnosis not present

## 2021-10-07 DIAGNOSIS — D485 Neoplasm of uncertain behavior of skin: Secondary | ICD-10-CM | POA: Diagnosis not present

## 2021-10-07 DIAGNOSIS — D1800 Hemangioma unspecified site: Secondary | ICD-10-CM | POA: Diagnosis not present

## 2021-10-07 DIAGNOSIS — L821 Other seborrheic keratosis: Secondary | ICD-10-CM | POA: Diagnosis not present

## 2021-10-07 DIAGNOSIS — L814 Other melanin hyperpigmentation: Secondary | ICD-10-CM | POA: Diagnosis not present

## 2021-10-07 DIAGNOSIS — C44519 Basal cell carcinoma of skin of other part of trunk: Secondary | ICD-10-CM | POA: Diagnosis not present

## 2021-10-07 DIAGNOSIS — D225 Melanocytic nevi of trunk: Secondary | ICD-10-CM | POA: Diagnosis not present

## 2021-10-07 DIAGNOSIS — Z85828 Personal history of other malignant neoplasm of skin: Secondary | ICD-10-CM | POA: Diagnosis not present

## 2021-10-27 ENCOUNTER — Other Ambulatory Visit: Payer: Self-pay

## 2021-10-27 ENCOUNTER — Other Ambulatory Visit: Payer: Self-pay | Admitting: Interventional Cardiology

## 2021-10-27 DIAGNOSIS — R079 Chest pain, unspecified: Secondary | ICD-10-CM | POA: Insufficient documentation

## 2021-10-27 DIAGNOSIS — K5730 Diverticulosis of large intestine without perforation or abscess without bleeding: Secondary | ICD-10-CM | POA: Insufficient documentation

## 2021-10-27 DIAGNOSIS — K760 Fatty (change of) liver, not elsewhere classified: Secondary | ICD-10-CM | POA: Insufficient documentation

## 2021-10-27 DIAGNOSIS — K648 Other hemorrhoids: Secondary | ICD-10-CM | POA: Insufficient documentation

## 2021-10-27 DIAGNOSIS — K5900 Constipation, unspecified: Secondary | ICD-10-CM | POA: Insufficient documentation

## 2021-11-11 DIAGNOSIS — C44519 Basal cell carcinoma of skin of other part of trunk: Secondary | ICD-10-CM | POA: Diagnosis not present

## 2021-12-01 ENCOUNTER — Other Ambulatory Visit: Payer: Self-pay | Admitting: Interventional Cardiology

## 2021-12-28 ENCOUNTER — Other Ambulatory Visit: Payer: Self-pay | Admitting: Interventional Cardiology

## 2022-01-06 ENCOUNTER — Ambulatory Visit: Payer: PPO | Attending: Nurse Practitioner | Admitting: Nurse Practitioner

## 2022-01-06 ENCOUNTER — Encounter: Payer: Self-pay | Admitting: Nurse Practitioner

## 2022-01-06 VITALS — BP 120/80 | HR 89 | Ht 67.0 in | Wt 189.0 lb

## 2022-01-06 DIAGNOSIS — E785 Hyperlipidemia, unspecified: Secondary | ICD-10-CM | POA: Diagnosis not present

## 2022-01-06 DIAGNOSIS — R002 Palpitations: Secondary | ICD-10-CM | POA: Diagnosis not present

## 2022-01-06 DIAGNOSIS — R55 Syncope and collapse: Secondary | ICD-10-CM | POA: Diagnosis not present

## 2022-01-06 DIAGNOSIS — I700 Atherosclerosis of aorta: Secondary | ICD-10-CM

## 2022-01-06 MED ORDER — ROSUVASTATIN CALCIUM 10 MG PO TABS: ORAL_TABLET | ORAL | 3 refills | Status: DC

## 2022-01-06 NOTE — Patient Instructions (Signed)
Medication Instructions:   Your physician recommends that you continue on your current medications as directed. Please refer to the Current Medication list given to you today.   *If you need a refill on your cardiac medications before your next appointment, please call your pharmacy*   Lab Work:  TODAY!!!! LIPID/CMET  If you have labs (blood work) drawn today and your tests are completely normal, you will receive your results only by: [place] (if you have MyChart) OR A paper copy in the mail If you have any lab test that is abnormal or we need to change your treatment, we will call you to review the results.   Testing/Procedures:  Your physician has requested that you have an echocardiogram. Echocardiography is a painless test that uses sound waves to create images of your heart. It provides your doctor with information about the size and shape of your heart and how well your heart's chambers and valves are working. This procedure takes approximately one hour. There are no restrictions for this procedure. Please do NOT wear cologne, perfume, aftershave, or lotions (deodorant is allowed). Please arrive 15 minutes prior to your appointment time.  Follow-Up: At [facility], you and your health needs are our priority.  As part of our continuing mission to provide you with exceptional heart care, we have created designated Provider Care Teams.  These Care Teams include your primary Cardiologist (physician) and Advanced Practice Providers (APPs -  Physician Assistants and Nurse Practitioners) who all work together to provide you with the care you need, when you need it.  We recommend signing up for the patient portal called "MyChart".  Sign up information is provided on this After Visit Summary.  MyChart is used to connect with patients for Virtual Visits (Telemedicine).  Patients are able to view lab/test results, encounter notes, upcoming appointments, etc.  Non-urgent  messages can be sent to your provider as well.   To learn more about what you can do with MyChart, go to [email or web address].    Your next appointment:   3 month(s)  The format for your next appointment:   In Person  Provider:   Christen Bame, NP         Other Instructions Adopting a Healthy Lifestyle.   Weight: Know what a healthy weight is for you (roughly BMI <25) and aim to maintain this. You can calculate your body mass index on your smart phone  Diet: Aim for 7+ servings of fruits and vegetables daily Limit animal fats in diet for cholesterol and heart health - choose grass fed whenever available Avoid highly processed foods (fast food burgers, tacos, fried chicken, pizza, hot dogs, french fries)  Saturated fat comes in the form of butter, lard, coconut oil, margarine, partially hydrogenated oils, and fat in meat. These increase your risk of cardiovascular disease.  Use healthy plant oils, such as olive, canola, soy, corn, sunflower and peanut.  Whole foods such as fruits, vegetables and whole grains have fiber  Men need > 38 grams of fiber per day Women need > 25 grams of fiber per day  Load up on vegetables and fruits - one-half of your plate: Aim for color and variety, and remember that potatoes dont count. Go for whole grains - one-quarter of your plate: Whole wheat, barley, wheat berries, quinoa, oats, brown rice, and foods made with them. If you want pasta, go with whole wheat pasta. Protein power - one-quarter of your plate: Fish, chicken, beans, and nuts are all healthy,  versatile protein sources. Limit red meat. You need carbohydrates for energy! The type of carbohydrate is more important than the amount. Choose carbohydrates such as vegetables, fruits, whole grains, beans, and nuts in the place of white rice, white pasta, potatoes (baked or fried), macaroni and cheese, cakes, cookies, and donuts.  If youre thirsty, drink water. Coffee and tea are good in  moderation, but skip sugary drinks and limit milk and dairy products to one or two daily servings. Keep sugar intake at 6 teaspoons or 24 grams or LESS       Exercise: Aim for 150 min of moderate intensity exercise weekly for heart health, and weights twice weekly for bone health Stay active - any steps are better than no steps! Aim for 7-9 hours of sleep daily        Important Information About Sugar

## 2022-01-06 NOTE — Progress Notes (Signed)
Cardiology Office Note:    Date:  01/06/2022   ID:  Amy, Glenn 03/08/1951, MRN [id number]  PCP:  London Pepper, MD   [facility] Providers Cardiologist:  None     Referring MD: London Pepper, MD   Chief Complaint: palpitations  History of Present Illness:    Amy Glenn is a very pleasant 71 y.o. female with a hx of palpitations, GERD, aortic atherosclerosis, and hypothyroidism.   Referred to cardiology by PCP and seen by Dr. Irish Lack on 08/16/20.  She reported a history of PVCs.  Recent symptoms of palpitations that felt different, lasting 30 seconds at the time.  No obvious trigger and associated with some lightheadedness.  Per Dr. Illene Bolus abdominal CT from 06/2020 showed aortic atherosclerosis.  He recommended that she start rosuvastatin 10 mg daily. Cardiac monitor was ordered to evaluate palpitations which revealed normal sinus rhythm with rare PACs and PVCs.  Single brief run of PACs lasting a few seconds, no associated symptoms.  No atrial fibrillation.  Symptoms associated with sinus tachycardia, HR 105, no sustained pathological arrhythmias. One year follow-up was recommended.  Today, she is here alone for follow-up. Reports she remains concerned about palpitations. Feels like her heart stops beating, has narrowing her visual field and lightheadedness which lasts for a few seconds and then resolves. No syncope. Feeling in her head also occurs if she leans down to pick up something and comes up too fast, but does not feel her heart stop when bending. Symptoms are same as when initially seen by Dr. Irish Lack June 2022.  Had no symptoms while wearing the monitor, then had 2 episodes this past week. Admits to poor hydration, but no clear correlation that symptoms are worse when dehydrated, with caffeine consumption, or with activity. She denies chest pain, shortness of breath, lower extremity edema, fatigue, diaphoresis, weakness, syncope, orthopnea, and PND.  Past  Medical History:  Diagnosis Date   Arthritis    Chronic bronchitis ([place])    Depression    GERD (gastroesophageal reflux disease)    Hypothyroidism    Wears hearing aid    both ears    Past Surgical History:  Procedure Laterality Date   APPENDECTOMY     CHOLECYSTECTOMY  03/16/1980   DILATION AND CURETTAGE OF UTERUS  03/16/2001   fibroids   ECTOPIC PREGNANCY SURGERY  1979x2   on rt   ECTOPIC PREGNANCY SURGERY  03/16/1978   x2 left   EXPLORATORY LAPAROTOMY  10/15/1975   for fertility   HERNIA REPAIR  03/16/1993   lt LA   KNEE ARTHROSCOPY Left 07/12/2012   Procedure: LEFT KNEE ARTHROSCOPY ;  Surgeon: Hessie Dibble, MD;  Location: [place];  Service: Orthopedics;  Laterality: Left;  Left knee arthroscopy with partial medial menisectomy and chondroplasty   LAPAROSCOPIC ABDOMINAL EXPLORATION  03/16/1976   repair fall tube scarring fertility   REPLACEMENT UNICONDYLAR JOINT KNEE Right 2010   REPLACEMENT UNICONDYLAR JOINT KNEE Left 2008   SHOULDER ARTHROSCOPY  03/17/2003   left   XI ROBOTIC ASSISTED LOWER ANTERIOR RESECTION N/A 05/30/2021   Procedure: XI ROBOTIC ASSISTED LOWER ANTERIOR RESECTION OF COLON RECTOSIGMOID, INTRAOPERATIVE ASSESSMENT OF PERFUSION USING FIREFLY, TAP BLOCK, AND RIGID PROCTOSCOPY;  Surgeon: Michael Boston, MD;  Location: [facility];  Service: General;  Laterality: N/A;    Current Medications: Current Meds  Medication Sig   acidophilus (RISAQUAD) CAPS capsule Take 1 capsule by mouth daily.   amitriptyline (ELAVIL) 10 MG tablet Take 10 mg  by mouth at bedtime.   b complex vitamins capsule Take 1 capsule by mouth daily.   cholecalciferol (VITAMIN D3) 25 MCG (1000 UNIT) tablet Take 1,000 Units by mouth daily.   dorzolamide-timolol (COSOPT) 22.3-6.8 MG/ML ophthalmic solution Place 1 drop into both eyes 2 (two) times daily.   EUTHYROX 50 MCG tablet Take 50 mcg by mouth every morning.   lansoprazole (PREVACID) 15 MG capsule Take 15 mg by mouth daily.    MAGNESIUM PO Take 1 tablet by mouth daily.   methocarbamol (ROBAXIN) 500 MG tablet Take 500-1,000 mg by mouth 4 (four) times daily as needed.   metoCLOPramide (REGLAN) 10 MG tablet Take 1 tablet (10 mg total) by mouth every 6 (six) hours. (Patient taking differently: Take 10 mg by mouth every 6 (six) hours as needed for vomiting.)   mometasone (ELOCON) 0.1 % lotion as needed for rash.   Multiple Vitamin (MULTIVITAMIN WITH MINERALS) TABS tablet Take 1 tablet by mouth daily.   PARoxetine (PAXIL) 20 MG tablet Take 20 mg by mouth every morning.   POTASSIUM CITRATE PO Take 1 tablet by mouth daily.   traMADol (ULTRAM) 50 MG tablet Take 1 tablet (50 mg total) by mouth every 6 (six) hours as needed for moderate pain or severe pain.   vitamin E 180 MG (400 UNITS) capsule Take 400 Units by mouth daily.   zinc gluconate 50 MG tablet Take 50 mg by mouth daily.   zolpidem (AMBIEN) 5 MG tablet Take 5 mg by mouth at bedtime as needed for sleep.   [DISCONTINUED] rosuvastatin (CRESTOR) 10 MG tablet Take 1 tablet (10 mg total) by mouth daily. Patient needs appointment in order to receive anymore refills. Thank You. 2nd Attempt. (Patient taking differently: Take 10 mg by mouth daily.)     Allergies:   Patient has no known allergies.   Social History   Socioeconomic History   Marital status: Divorced    Spouse name: Not on file   Number of children: Not on file   Years of education: Not on file   Highest education level: Not on file  Occupational History   Not on file  Tobacco Use   Smoking status: Never   Smokeless tobacco: Never  Vaping Use   Vaping Use: Never used  Substance and Sexual Activity   Alcohol use: Yes    Comment: occ   Drug use: No   Sexual activity: Not Currently  Other Topics Concern   Not on file  Social History Narrative   Not on file   Social Determinants of Health   Financial Resource Strain: Not on file  Food Insecurity: Not on file  Transportation Needs: Not on file   Physical Activity: Not on file  Stress: Not on file  Social Connections: Not on file     Family History: The patient's family history includes Hypertension in her sister.  ROS:   Please see the history of present illness.    + palpitations + presyncope All other systems reviewed and are negative.  Labs/Other Studies Reviewed:    The following studies were reviewed today:  Cardiac Monitor 09/12/20  Normal sinus rhythm with rare PACs and PVCs. Single brief, run of PACs lasting just a few seconds. No associated symptoms. No atrial fibrillation. Patient had symptoms with sinus tachycardia, HR 105. No sustained pathologic arrhythmias.    Recent Labs: 04/15/2021: ALT 42 05/31/2021: BUN 13; Creatinine, Ser 0.78; Hemoglobin 12.4; Magnesium 2.0; Platelets 307; Potassium 3.8; Sodium 137  Recent Lipid Panel  No results found for: "CHOL", "TRIG", "HDL", "CHOLHDL", "VLDL", "LDLCALC", "LDLDIRECT"   Risk Assessment/Calculations:         Physical Exam:    VS:  BP 120/80   Pulse 89   Ht _0  (1.702 m)   Wt 189 lb (85.7 kg)   SpO2 95%   BMI 29.60 kg/m     Wt Readings from Last 3 Encounters:  01/06/22 189 lb (85.7 kg)  05/23/21 185 lb (83.9 kg)  04/16/21 185 lb (83.9 kg)     GEN: Well nourished, well developed in no acute distress HEENT: Normal NECK: No JVD; No carotid bruits CARDIAC: RRR, no murmurs, rubs, gallops RESPIRATORY:  Clear to auscultation without rales, wheezing or rhonchi  ABDOMEN: Soft, non-tender, non-distended MUSCULOSKELETAL:  No edema; No deformity. 2+ pedal pulses, equal bilaterally SKIN: Warm and dry NEUROLOGIC:  Alert and oriented x 3 PSYCHIATRIC:  Normal affect   EKG:  EKG is ordered today.  The ekg ordered today demonstrates NSR at 89 bpm, incomplete RBBB, nonspecific T wave abnormality, no acute change from previous   Diagnoses:    1. Palpitations   2. Aortic atherosclerosis ([place])   3. Near syncope   4. Hyperlipidemia LDL goal <70     Assessment and Plan:     Palpitations: Occasional palpitations associated with lightheadedness/presyncope.  These occur randomly and are very concerning to her. Did not have symptoms while wearing cardiac monitor 08/2020. Lengthy discussion about potential options. She would like to proceed with 2D echocardiogram to evaluate for structural heart disease.  If echo is unrevealing in regards to symptoms, she would like to be referred to EP for consideration of Linq monitor.  Presyncope: Occasional lightheadedness/presyncope associated with palpitations.  As noted above, no clear indication as to triggers and symptoms occur randomly. Very concerning to her. As noted above, will get echo and consider referral to EP for consideration of Linq monitor.   Aortic atherosclerosis: Aortic atherosclerosis noted on prior imaging. She denies chest pain, dyspnea, or other symptoms concerning for angina.  No indication for further ischemic evaluation at this time. LDL above goal of 70, see plan below. Encouraged healthy lifestyle including mostly plant-based diet, 150 minutes moderate intensity exercise each week, and weight loss.   Hyperlipidemia LDL goal < 70: LDL 100 on 06/18/21. Will recheck today.  She is agreeable to consider increase in rosuvastatin if LDL remains elevated.     Disposition: 3 months with me  Medication Adjustments/Labs and Tests Ordered: Current medicines are reviewed at length with the patient today.  Concerns regarding medicines are outlined above.  Orders Placed This Encounter  Procedures   Lipid Profile   Comp Met (CMET)   EKG 12-Lead   ECHOCARDIOGRAM COMPLETE   Meds ordered this encounter  Medications   rosuvastatin (CRESTOR) 10 MG tablet    Sig: Take one (1) tablet by mouth ( 10 mg) at bedtime.    Dispense:  90 tablet    Refill:  3    Patient Instructions  Medication Instructions:   Your physician recommends that you continue on your current medications as directed.  Please refer to the Current Medication list given to you today.   *If you need a refill on your cardiac medications before your next appointment, please call your pharmacy*   Lab Work:  TODAY!!!! LIPID/CMET  If you have labs (blood work) drawn today and your tests are completely normal, you will receive your results only by: [place] (if you have MyChart) OR A  paper copy in the mail If you have any lab test that is abnormal or we need to change your treatment, we will call you to review the results.   Testing/Procedures:  Your physician has requested that you have an echocardiogram. Echocardiography is a painless test that uses sound waves to create images of your heart. It provides your doctor with information about the size and shape of your heart and how well your heart's chambers and valves are working. This procedure takes approximately one hour. There are no restrictions for this procedure. Please do NOT wear cologne, perfume, aftershave, or lotions (deodorant is allowed). Please arrive 15 minutes prior to your appointment time.  Follow-Up: At [facility], you and your health needs are our priority.  As part of our continuing mission to provide you with exceptional heart care, we have created designated Provider Care Teams.  These Care Teams include your primary Cardiologist (physician) and Advanced Practice Providers (APPs -  Physician Assistants and Nurse Practitioners) who all work together to provide you with the care you need, when you need it.  We recommend signing up for the patient portal called "MyChart".  Sign up information is provided on this After Visit Summary.  MyChart is used to connect with patients for Virtual Visits (Telemedicine).  Patients are able to view lab/test results, encounter notes, upcoming appointments, etc.  Non-urgent messages can be sent to your provider as well.   To learn more about what you can do with MyChart, go to  [email or web address].    Your next appointment:   3 month(s)  The format for your next appointment:   In Person  Provider:   Christen Bame, NP         Other Instructions Adopting a Healthy Lifestyle.   Weight: Know what a healthy weight is for you (roughly BMI <25) and aim to maintain this. You can calculate your body mass index on your smart phone  Diet: Aim for 7+ servings of fruits and vegetables daily Limit animal fats in diet for cholesterol and heart health - choose grass fed whenever available Avoid highly processed foods (fast food burgers, tacos, fried chicken, pizza, hot dogs, french fries)  Saturated fat comes in the form of butter, lard, coconut oil, margarine, partially hydrogenated oils, and fat in meat. These increase your risk of cardiovascular disease.  Use healthy plant oils, such as olive, canola, soy, corn, sunflower and peanut.  Whole foods such as fruits, vegetables and whole grains have fiber  Men need > 38 grams of fiber per day Women need > 25 grams of fiber per day  Load up on vegetables and fruits - one-half of your plate: Aim for color and variety, and remember that potatoes dont count. Go for whole grains - one-quarter of your plate: Whole wheat, barley, wheat berries, quinoa, oats, brown rice, and foods made with them. If you want pasta, go with whole wheat pasta. Protein power - one-quarter of your plate: Fish, chicken, beans, and nuts are all healthy, versatile protein sources. Limit red meat. You need carbohydrates for energy! The type of carbohydrate is more important than the amount. Choose carbohydrates such as vegetables, fruits, whole grains, beans, and nuts in the place of white rice, white pasta, potatoes (baked or fried), macaroni and cheese, cakes, cookies, and donuts.  If youre thirsty, drink water. Coffee and tea are good in moderation, but skip sugary drinks and limit milk and dairy products to one or two daily servings. Keep sugar  intake at 6 teaspoons or 24 grams or LESS       Exercise: Aim for 150 min of moderate intensity exercise weekly for heart health, and weights twice weekly for bone health Stay active - any steps are better than no steps! Aim for 7-9 hours of sleep daily        Important Information About Sugar         Signed, Emmaline Life, NP  01/06/2022 4:06 PM    [place]

## 2022-01-07 LAB — COMPREHENSIVE METABOLIC PANEL
ALT: 58 IU/L — ABNORMAL HIGH (ref 0–32)
AST: 65 IU/L — ABNORMAL HIGH (ref 0–40)
Albumin/Globulin Ratio: 2 (ref 1.2–2.2)
Albumin: 4.3 g/dL (ref 3.9–4.9)
Alkaline Phosphatase: 59 IU/L (ref 44–121)
BUN/Creatinine Ratio: 19 (ref 12–28)
BUN: 16 mg/dL (ref 8–27)
Bilirubin Total: 0.3 mg/dL (ref 0.0–1.2)
CO2: 24 mmol/L (ref 20–29)
Calcium: 9.5 mg/dL (ref 8.7–10.3)
Chloride: 104 mmol/L (ref 96–106)
Creatinine, Ser: 0.85 mg/dL (ref 0.57–1.00)
Globulin, Total: 2.1 g/dL (ref 1.5–4.5)
Glucose: 93 mg/dL (ref 70–99)
Potassium: 4.5 mmol/L (ref 3.5–5.2)
Sodium: 144 mmol/L (ref 134–144)
Total Protein: 6.4 g/dL (ref 6.0–8.5)
eGFR: 74 mL/min/{1.73_m2} (ref >59)

## 2022-01-07 LAB — LIPID PANEL
Chol/HDL Ratio: 2.9 ratio (ref 0.0–4.4)
Cholesterol, Total: 176 mg/dL (ref 100–199)
HDL: 60 mg/dL (ref >39)
LDL Chol Calc (NIH): 96 mg/dL (ref 0–99)
Triglycerides: 111 mg/dL (ref 0–149)
VLDL Cholesterol Cal: 20 mg/dL (ref 5–40)

## 2022-01-14 DIAGNOSIS — R7309 Other abnormal glucose: Secondary | ICD-10-CM | POA: Diagnosis not present

## 2022-01-14 DIAGNOSIS — E039 Hypothyroidism, unspecified: Secondary | ICD-10-CM | POA: Diagnosis not present

## 2022-01-14 DIAGNOSIS — G4700 Insomnia, unspecified: Secondary | ICD-10-CM | POA: Diagnosis not present

## 2022-01-14 DIAGNOSIS — E785 Hyperlipidemia, unspecified: Secondary | ICD-10-CM | POA: Diagnosis not present

## 2022-01-23 ENCOUNTER — Ambulatory Visit (HOSPITAL_COMMUNITY): Payer: PPO | Attending: Cardiovascular Disease

## 2022-01-23 DIAGNOSIS — R002 Palpitations: Secondary | ICD-10-CM | POA: Diagnosis not present

## 2022-01-23 DIAGNOSIS — R55 Syncope and collapse: Secondary | ICD-10-CM | POA: Insufficient documentation

## 2022-01-23 DIAGNOSIS — I700 Atherosclerosis of aorta: Secondary | ICD-10-CM | POA: Insufficient documentation

## 2022-01-23 LAB — ECHOCARDIOGRAM COMPLETE
Area-P 1/2: 3.99 cm2
P 1/2 time: 431 msec
S' Lateral: 3.1 cm

## 2022-04-01 NOTE — Progress Notes (Deleted)
Cardiology Office Note:    Date:  04/01/2022   ID:  Amy, Glenn 1950/04/05, MRN [id number]  PCP:  London Pepper, MD   [facility] Providers Cardiologist:  None     Referring MD: London Pepper, MD   Chief Complaint: ***  History of Present Illness:    Amy Glenn is a very pleasant 72 y.o. female with a hx of palpitations, GERD, aortic atherosclerosis, and hypothyroidism.   Referred to cardiology by PCP and seen by Dr. Irish Lack on 08/16/20.  She reported a history of PVCs.  Recent symptoms of palpitations that felt different, lasting 30 seconds at the time.  No obvious trigger and associated with some lightheadedness.  Per Dr. Illene Bolus abdominal CT from 06/2020 showed aortic atherosclerosis.  He recommended that she start rosuvastatin 10 mg daily. Cardiac monitor was ordered to evaluate palpitations which revealed normal sinus rhythm with rare PACs and PVCs.  Single brief run of PACs lasting a few seconds, no associated symptoms.  No atrial fibrillation.  Symptoms associated with sinus tachycardia, HR 105, no sustained pathological arrhythmias. One year follow-up was recommended.  Seen in cardiology clinic by me on 01/06/22 with concerns about palpitations. Feels like her heart stops beating, has narrowing in her visual field and lightheadedness which lasts for a few seconds and then resolves. No syncope. Feeling in her head also occurs if she leans down to pick up something and comes up too fast, but does not feel her heart stop when bending. Symptoms are same as when initially seen by Dr. Irish Lack June 2022.  Had no symptoms while wearing the monitor, then had 2 episodes this past week. Admits to poor hydration, but no clear correlation that symptoms are worse when dehydrated, with caffeine consumption, or with activity. Denied chest pain, shortness of breath, lower extremity edema, fatigue, diaphoresis, weakness, syncope, orthopnea, and PND. 2D echo ordered for evaluation of  heart and valve function.  It revealed normal LVEF 60 to 65%, G1DD, normal RV, mild AI.   Today, she is here  *** denies chest pain, shortness of breath, lower extremity edema, fatigue, palpitations, melena, hematuria, hemoptysis, diaphoresis, weakness, presyncope, syncope, orthopnea, and PND.   Past Medical History:  Diagnosis Date   Arthritis    Chronic bronchitis ([place])    Depression    GERD (gastroesophageal reflux disease)    Hypothyroidism    Wears hearing aid    both ears    Past Surgical History:  Procedure Laterality Date   APPENDECTOMY     CHOLECYSTECTOMY  03/16/1980   DILATION AND CURETTAGE OF UTERUS  03/16/2001   fibroids   ECTOPIC PREGNANCY SURGERY  1979x2   on rt   ECTOPIC PREGNANCY SURGERY  03/16/1978   x2 left   EXPLORATORY LAPAROTOMY  10/15/1975   for fertility   HERNIA REPAIR  03/16/1993   lt LA   KNEE ARTHROSCOPY Left 07/12/2012   Procedure: LEFT KNEE ARTHROSCOPY ;  Surgeon: Hessie Dibble, MD;  Location: [place];  Service: Orthopedics;  Laterality: Left;  Left knee arthroscopy with partial medial menisectomy and chondroplasty   LAPAROSCOPIC ABDOMINAL EXPLORATION  03/16/1976   repair fall tube scarring fertility   REPLACEMENT UNICONDYLAR JOINT KNEE Right 2010   REPLACEMENT UNICONDYLAR JOINT KNEE Left 2008   SHOULDER ARTHROSCOPY  03/17/2003   left   XI ROBOTIC ASSISTED LOWER ANTERIOR RESECTION N/A 05/30/2021   Procedure: XI ROBOTIC ASSISTED LOWER ANTERIOR RESECTION OF COLON RECTOSIGMOID, INTRAOPERATIVE ASSESSMENT OF PERFUSION USING FIREFLY,  TAP BLOCK, AND RIGID PROCTOSCOPY;  Surgeon: Michael Boston, MD;  Location: [facility];  Service: General;  Laterality: N/A;    Current Medications: No outpatient medications have been marked as taking for the 04/03/22 encounter (Appointment) with Ann Maki, Lanice Schwab, NP.     Allergies:   Patient has no known allergies.   Social History   Socioeconomic History   Marital status: Divorced    Spouse  name: Not on file   Number of children: Not on file   Years of education: Not on file   Highest education level: Not on file  Occupational History   Not on file  Tobacco Use   Smoking status: Never   Smokeless tobacco: Never  Vaping Use   Vaping Use: Never used  Substance and Sexual Activity   Alcohol use: Yes    Comment: occ   Drug use: No   Sexual activity: Not Currently  Other Topics Concern   Not on file  Social History Narrative   Not on file   Social Determinants of Health   Financial Resource Strain: Not on file  Food Insecurity: Not on file  Transportation Needs: Not on file  Physical Activity: Not on file  Stress: Not on file  Social Connections: Not on file     Family History: The patient's family history includes Hypertension in her sister.  ROS:   Please see the history of present illness.    + palpitations + presyncope All other systems reviewed and are negative.  Labs/Other Studies Reviewed:    The following studies were reviewed today:  Echo 01/23/22 1. Left ventricular ejection fraction, by estimation, is 60 to 65%. The  left ventricle has normal function. The left ventricle has no regional  wall motion abnormalities. Left ventricular diastolic parameters are  consistent with Grade I diastolic  dysfunction (impaired relaxation). The average left ventricular global  longitudinal strain is -22.9 %. The global longitudinal strain is normal.   2. Right ventricular systolic function is normal. The right ventricular  size is normal. There is normal pulmonary artery systolic pressure.   3. The mitral valve is normal in structure. Trivial mitral valve  regurgitation. No evidence of mitral stenosis.   4. The aortic valve is normal in structure. Aortic valve regurgitation is  mild. No aortic stenosis is present.   5. The inferior vena cava is normal in size with greater than 50%  respiratory variability, suggesting right atrial pressure of 3  mmHg.  Cardiac Monitor 09/12/20  Normal sinus rhythm with rare PACs and PVCs. Single brief, run of PACs lasting just a few seconds. No associated symptoms. No atrial fibrillation. Patient had symptoms with sinus tachycardia, HR 105. No sustained pathologic arrhythmias.    Recent Labs: 05/31/2021: Hemoglobin 12.4; Magnesium 2.0; Platelets 307 01/06/2022: ALT 58; BUN 16; Creatinine, Ser 0.85; Potassium 4.5; Sodium 144  Recent Lipid Panel    Component Value Date/Time   CHOL 176 01/06/2022 1149   TRIG 111 01/06/2022 1149   HDL 60 01/06/2022 1149   CHOLHDL 2.9 01/06/2022 1149   LDLCALC 96 01/06/2022 1149     Risk Assessment/Calculations:         Physical Exam:    VS:  There were no vitals taken for this visit.    Wt Readings from Last 3 Encounters:  01/06/22 189 lb (85.7 kg)  05/23/21 185 lb (83.9 kg)  04/16/21 185 lb (83.9 kg)     GEN: Well nourished, well developed in no acute distress HEENT: Normal  NECK: No JVD; No carotid bruits CARDIAC: RRR, no murmurs, rubs, gallops RESPIRATORY:  Clear to auscultation without rales, wheezing or rhonchi  ABDOMEN: Soft, non-tender, non-distended MUSCULOSKELETAL:  No edema; No deformity. 2+ pedal pulses, equal bilaterally SKIN: Warm and dry NEUROLOGIC:  Alert and oriented x 3 PSYCHIATRIC:  Normal affect   EKG:  EKG is ***  Diagnoses:    No diagnosis found.  Assessment and Plan:     Palpitations: Occasional palpitations associated with lightheadedness/presyncope.  These occur randomly and are very concerning to her. Did not have symptoms while wearing cardiac monitor 08/2020. Lengthy discussion about potential options. She would like to proceed with 2D echocardiogram to evaluate for structural heart disease.  If echo is unrevealing in regards to symptoms, she would like to be referred to EP for consideration of Linq monitor.  Presyncope: Occasional lightheadedness/presyncope associated with palpitations.  As noted above, no  clear indication as to triggers and symptoms occur randomly. Very concerning to her. As noted above, will get echo and consider referral to EP for consideration of Linq monitor.   Aortic atherosclerosis: Aortic atherosclerosis noted on prior imaging. She denies chest pain, dyspnea, or other symptoms concerning for angina.  No indication for further ischemic evaluation at this time. LDL above goal of 70, see plan below. Encouraged healthy lifestyle including mostly plant-based diet, 150 minutes moderate intensity exercise each week, and weight loss.   Hyperlipidemia LDL goal < 70: LDL 100 on 06/18/21. Will recheck today.  She is agreeable to consider increase in rosuvastatin if LDL remains elevated.     Disposition: ***  Medication Adjustments/Labs and Tests Ordered: Current medicines are reviewed at length with the patient today.  Concerns regarding medicines are outlined above.  No orders of the defined types were placed in this encounter.  No orders of the defined types were placed in this encounter.   There are no Patient Instructions on file for this visit.   Signed, Emmaline Life, NP  04/01/2022 8:53 AM    [place]

## 2022-04-03 ENCOUNTER — Ambulatory Visit: Payer: PPO | Admitting: Nurse Practitioner

## 2022-04-08 DIAGNOSIS — N6081 Other benign mammary dysplasias of right breast: Secondary | ICD-10-CM | POA: Diagnosis not present

## 2022-04-08 DIAGNOSIS — R922 Inconclusive mammogram: Secondary | ICD-10-CM | POA: Diagnosis not present

## 2022-04-08 DIAGNOSIS — N6315 Unspecified lump in the right breast, overlapping quadrants: Secondary | ICD-10-CM | POA: Diagnosis not present

## 2022-04-10 DIAGNOSIS — H40003 Preglaucoma, unspecified, bilateral: Secondary | ICD-10-CM | POA: Diagnosis not present

## 2022-05-12 DIAGNOSIS — H60542 Acute eczematoid otitis externa, left ear: Secondary | ICD-10-CM | POA: Diagnosis not present

## 2022-05-12 DIAGNOSIS — L710 Perioral dermatitis: Secondary | ICD-10-CM | POA: Diagnosis not present

## 2022-05-12 DIAGNOSIS — H019 Unspecified inflammation of eyelid: Secondary | ICD-10-CM | POA: Diagnosis not present

## 2022-06-24 DIAGNOSIS — R002 Palpitations: Secondary | ICD-10-CM | POA: Diagnosis not present

## 2022-06-24 DIAGNOSIS — R7309 Other abnormal glucose: Secondary | ICD-10-CM | POA: Diagnosis not present

## 2022-06-24 DIAGNOSIS — I700 Atherosclerosis of aorta: Secondary | ICD-10-CM | POA: Diagnosis not present

## 2022-06-24 DIAGNOSIS — R5383 Other fatigue: Secondary | ICD-10-CM | POA: Diagnosis not present

## 2022-06-26 ENCOUNTER — Encounter (HOSPITAL_BASED_OUTPATIENT_CLINIC_OR_DEPARTMENT_OTHER): Payer: Self-pay | Admitting: Urology

## 2022-06-26 ENCOUNTER — Emergency Department (HOSPITAL_BASED_OUTPATIENT_CLINIC_OR_DEPARTMENT_OTHER)
Admission: EM | Admit: 2022-06-26 | Discharge: 2022-06-26 | Disposition: A | Discharge status: Home / Self Care | Payer: PPO | Attending: Emergency Medicine | Admitting: Emergency Medicine

## 2022-06-26 DIAGNOSIS — R002 Palpitations: Secondary | ICD-10-CM | POA: Diagnosis not present

## 2022-06-26 DIAGNOSIS — R5383 Other fatigue: Secondary | ICD-10-CM | POA: Diagnosis not present

## 2022-06-26 DIAGNOSIS — R55 Syncope and collapse: Secondary | ICD-10-CM | POA: Insufficient documentation

## 2022-06-26 DIAGNOSIS — E039 Hypothyroidism, unspecified: Secondary | ICD-10-CM | POA: Diagnosis not present

## 2022-06-26 LAB — BASIC METABOLIC PANEL WITH GFR
Anion gap: 8 (ref 5–15)
BUN: 17 mg/dL (ref 8–23)
CO2: 27 mmol/L (ref 22–32)
Calcium: 8.8 mg/dL — ABNORMAL LOW (ref 8.9–10.3)
Chloride: 103 mmol/L (ref 98–111)
Creatinine, Ser: 0.79 mg/dL (ref 0.44–1.00)
GFR, Estimated: >60 mL/min (ref >60)
Glucose, Bld: 127 mg/dL — ABNORMAL HIGH (ref 70–99)
Potassium: 3.9 mmol/L (ref 3.5–5.1)
Sodium: 138 mmol/L (ref 135–145)

## 2022-06-26 LAB — CBC
HCT: 44.2 % (ref 36.0–46.0)
Hemoglobin: 14.7 g/dL (ref 12.0–15.0)
MCH: 29.5 pg (ref 26.0–34.0)
MCHC: 33.3 g/dL (ref 30.0–36.0)
MCV: 88.6 fL (ref 80.0–100.0)
Platelets: 289 K/uL (ref 150–400)
RBC: 4.99 MIL/uL (ref 3.87–5.11)
RDW: 13.3 % (ref 11.5–15.5)
WBC: 7.2 K/uL (ref 4.0–10.5)
nRBC: 0 % (ref 0.0–0.2)

## 2022-06-26 LAB — T4, FREE: Free T4: 0.69 ng/dL (ref 0.61–1.12)

## 2022-06-26 LAB — TSH: TSH: 4.011 u[IU]/mL (ref 0.350–4.500)

## 2022-06-26 NOTE — Discharge Instructions (Signed)
Follow-up with a cardiologist for further evaluation.  They should contact you to schedule an appointment.

## 2022-06-26 NOTE — ED Notes (Signed)
Bedside report rec'd from prev RN 

## 2022-06-26 NOTE — ED Triage Notes (Signed)
Pt states has had issues with palpitations x 2-3 months Has been having episodes intermittently where it " feels like my heart stops and I almost pass out"  Can not get in to cardiology till may  Has been having extreme fatigue earlier this week  Denies Pain at this time   H/o "prob with electrolytes"

## 2022-06-26 NOTE — ED Provider Notes (Signed)
[facility] Provider Note   CSN: [id number] Arrival date & time: 06/26/22  1143     History {Add pertinent medical, surgical, social history, OB history to HPI:1} Chief Complaint  Patient presents with   Near Syncope    Amy Glenn is a 72 y.o. female.   Near Syncope     Patient has a history of bronchitis, depression, reflux, palpitations, hypothyroidism.  She presents to the ED for evaluation of palpitations.  Patient states she has been having symptoms ongoing for several months.  She has a sensation of her heart skipping beats and being irregular.  She also has episodes where she feels like her heart will pause and she feels like she will almost pass out.  Patient previously saw cardiologist and had a cardiac monitor for couple of weeks.  Patient states they did not find any abnormalities.  She has contacted the cardiologist office and was told she cannot get an appointment until May.  Patient states she has been feeling fatigued this past week so she came to the ED for evaluation.  She denies any vomiting or diarrhea.  No chest pain.  She has not had any fainting spells  Home Medications Prior to Admission medications   Medication Sig Start Date End Date Taking? Authorizing Provider  acidophilus (RISAQUAD) CAPS capsule Take 1 capsule by mouth daily.    [provider]  amitriptyline (ELAVIL) 10 MG tablet Take 10 mg by mouth at bedtime.    [provider]  b complex vitamins capsule Take 1 capsule by mouth daily.    [provider]  cholecalciferol (VITAMIN D3) 25 MCG (1000 UNIT) tablet Take 1,000 Units by mouth daily.    [provider]  dorzolamide-timolol (COSOPT) 22.3-6.8 MG/ML ophthalmic solution Place 1 drop into both eyes 2 (two) times daily. 01/11/20   [provider]  EUTHYROX 50 MCG tablet Take 50 mcg by mouth every morning. 11/25/19   [provider]  lansoprazole  (PREVACID) 15 MG capsule Take 15 mg by mouth daily.    [provider]  MAGNESIUM PO Take 1 tablet by mouth daily.    [provider]  methocarbamol (ROBAXIN) 500 MG tablet Take 500-1,000 mg by mouth 4 (four) times daily as needed. 08/22/21   [provider]  metoCLOPramide (REGLAN) 10 MG tablet Take 1 tablet (10 mg total) by mouth every 6 (six) hours. Patient taking differently: Take 10 mg by mouth every 6 (six) hours as needed for vomiting. 04/16/21   Gwyneth Sprout, MD  mometasone (ELOCON) 0.1 % lotion as needed for rash. 11/20/20   [provider]  Multiple Vitamin (MULTIVITAMIN WITH MINERALS) TABS tablet Take 1 tablet by mouth daily.    [provider]  PARoxetine (PAXIL) 20 MG tablet Take 20 mg by mouth every morning.    [provider]  POTASSIUM CITRATE PO Take 1 tablet by mouth daily.    [provider]  rosuvastatin (CRESTOR) 10 MG tablet Take one (1) tablet by mouth ( 10 mg) at bedtime. 01/06/22   Swinyer, Zachary George, NP  traMADol (ULTRAM) 50 MG tablet Take 1 tablet (50 mg total) by mouth every 6 (six) hours as needed for moderate pain or severe pain. 06/01/21   Abigail Miyamoto, MD  vitamin E 180 MG (400 UNITS) capsule Take 400 Units by mouth daily.    [provider]  zinc gluconate 50 MG tablet Take 50 mg by mouth daily.  [provider]  zolpidem (AMBIEN) 5 MG tablet Take 5 mg by mouth at bedtime as needed for sleep.    [provider]      Allergies    Patient has no known allergies.    Review of Systems   Review of Systems  Cardiovascular:  Positive for near-syncope.    Physical Exam Updated Vital Signs BP (!) 146/93 (BP Location: Right Arm)   Pulse 98   Temp 97.8 F (36.6 C) (Oral)   Resp 20   Ht 1.702 m (5\' 7" )   Wt 86.2 kg   SpO2 96%   BMI 29.76 kg/m  Physical Exam Vitals and nursing note reviewed.  Constitutional:      General: She is not in acute distress.     Appearance: She is well-developed.  HENT:     Head: Normocephalic and atraumatic.     Right Ear: External ear normal.     Left Ear: External ear normal.  Eyes:     General: No scleral icterus.       Right eye: No discharge.        Left eye: No discharge.     Conjunctiva/sclera: Conjunctivae normal.  Neck:     Trachea: No tracheal deviation.  Cardiovascular:     Rate and Rhythm: Normal rate and regular rhythm.  Pulmonary:     Effort: Pulmonary effort is normal. No respiratory distress.     Breath sounds: Normal breath sounds. No stridor.  Abdominal:     General: There is no distension.  Musculoskeletal:        General: No swelling or deformity.     Cervical back: Neck supple.     Right lower leg: No edema.     Left lower leg: No edema.  Skin:    General: Skin is warm and dry.     Findings: No rash.  Neurological:     Mental Status: She is alert. Mental status is at baseline.     Cranial Nerves: No dysarthria or facial asymmetry.     Motor: No seizure activity.     ED Results / Procedures / Treatments   Labs (all labs ordered are listed, but only abnormal results are displayed) Labs Reviewed  BASIC METABOLIC PANEL  CBC    EKG None  Radiology No results found.  Procedures Procedures  {Document cardiac monitor, telemetry assessment procedure when appropriate:1}  Medications Ordered in ED Medications - No data to display  ED Course/ Medical Decision Making/ A&P   {   Click here for ABCD2, HEART and other calculatorsREFRESH Note before signing :1}                          Medical Decision Making Amount and/or Complexity of Data Reviewed Labs: ordered.  Patient records reviewed.  Patient did have an echocardiogram in November 2023.  Patient had mild diastolic dysfunction but normal ejection fraction.  Back in 2022 patient had long-term cardiac monitor.  It showed normal sinus rhythm with PACs and PVCs.  Sinus tachycardia was noted.  No pathologic  arrhythmias ***  {Document critical care time when appropriate:1} {Document review of labs and clinical decision tools ie heart score, Chads2Vasc2 etc:1}  {Document your independent review of radiology images, and any outside records:1} {Document your discussion with family members, caretakers, and with consultants:1} {Document social determinants of health affecting pt's care:1} {Document your decision making why or why not admission, treatments were needed:1} Final Clinical  Impression(s) / ED Diagnoses Final diagnoses:  None    Rx / DC Orders ED Discharge Orders     None

## 2022-07-24 ENCOUNTER — Ambulatory Visit: Payer: PPO | Admitting: Cardiology

## 2022-07-29 NOTE — Progress Notes (Deleted)
Cardiology Office Note:    Date:  07/29/2022   ID:  Amy Glenn, DOB 02-May-1950, MRN [id number]  PCP:  Farris Has, MD  [facility] Providers Cardiologist:  None { Click to update primary MD,subspecialty MD or APP then REFRESH:1}  *** Referring MD: Farris Has, MD   Chief Complaint:  No chief complaint on file. {Click here for Visit Info    :1}    History of Present Illness:   Amy Glenn is a 72 y.o. female with    a hx of palpitations, GERD, aortic atherosclerosis, and hypothyroidism.    abdominal CT from 06/2020 showed aortic atherosclerosis.  He recommended that she start rosuvastatin 10 mg daily. Cardiac monitor was ordered to evaluate palpitations which revealed normal sinus rhythm with rare PACs and PVCs.  Single brief run of PACs lasting a few seconds, no associated symptoms.  No atrial fibrillation.  Symptoms associated with sinus tachycardia, HR 105, no sustained pathological arrhythmias. One year follow-up was recommended.    Patient saw Ms. Swinyer 12/2021 with ongoing palpitations and presyncope. They discussed possible referral to EPS for linq monitor. Echo ordered and normal LVEF.  Patient went to ED 06/26/22 with palpitations and presyncope nothing found.      Past Medical History:  Diagnosis Date   Arthritis    Chronic bronchitis (HCC)    Depression    GERD (gastroesophageal reflux disease)    Hypothyroidism    Wears hearing aid    both ears   Current Medications: No outpatient medications have been marked as taking for the 08/11/22 encounter (Appointment) with Dyann Kief, PA-C.    Allergies:   Patient has no known allergies.   Social History   Tobacco Use   Smoking status: Never   Smokeless tobacco: Never  Vaping Use   Vaping Use: Never used  Substance Use Topics   Alcohol use: Yes    Comment: occ   Drug use: No    Family Hx: The patient's family history includes Hypertension in her sister.  ROS     Physical  Exam:    VS:  There were no vitals taken for this visit.    Wt Readings from Last 3 Encounters:  06/26/22 190 lb (86.2 kg)  01/06/22 189 lb (85.7 kg)  05/23/21 185 lb (83.9 kg)    Physical Exam  GEN: Well nourished, well developed, in no acute distress  HEENT: normal  Neck: no JVD, carotid bruits, or masses Cardiac:RRR; no murmurs, rubs, or gallops  Respiratory:  clear to auscultation bilaterally, normal work of breathing GI: soft, nontender, nondistended, + BS Ext: without cyanosis, clubbing, or edema, Good distal pulses bilaterally MS: no deformity or atrophy  Skin: warm and dry, no rash Neuro:  Alert and Oriented x 3, Strength and sensation are intact Psych: euthymic mood, full affect        EKGs/Labs/Other Test Reviewed:    EKG:  EKG is *** ordered today.  The ekg ordered today demonstrates ***  Recent Labs: 01/06/2022: ALT 58 06/26/2022: BUN 17; Creatinine, Ser 0.79; Hemoglobin 14.7; Platelets 289; Potassium 3.9; Sodium 138; TSH 4.011   Recent Lipid Panel Recent Labs    01/06/22 1149  CHOL 176  TRIG 111  HDL 60  LDLCALC 96     Prior CV Studies: {Select studies to display:26339}  Echo 01/2022 IMPRESSIONS     1. Left ventricular ejection fraction, by estimation, is 60 to 65%. The  left ventricle has normal function. The left ventricle  has no regional  wall motion abnormalities. Left ventricular diastolic parameters are  consistent with Grade I diastolic  dysfunction (impaired relaxation). The average left ventricular global  longitudinal strain is -22.9 %. The global longitudinal strain is normal.   2. Right ventricular systolic function is normal. The right ventricular  size is normal. There is normal pulmonary artery systolic pressure.   3. The mitral valve is normal in structure. Trivial mitral valve  regurgitation. No evidence of mitral stenosis.   4. The aortic valve is normal in structure. Aortic valve regurgitation is  mild. No aortic stenosis is  present.   5. The inferior vena cava is normal in size with greater than 50%  respiratory variability, suggesting right atrial pressure of 3 mmHg.     Risk Assessment/Calculations/Metrics:   {Does this patient have ATRIAL FIBRILLATION?:[id number]}     No BP recorded.  {Refresh Note OR Click here to enter BP  :1}***    ASSESSMENT & PLAN:   No problem-specific Assessment & Plan notes found for this encounter.   Palpitations with near syncope in ED 06/26/22 Palpitations: Occasional palpitations associated with lightheadedness/presyncope.  These occur randomly and are very concerning to her. Did not have symptoms while wearing cardiac monitor 08/2020. Lengthy discussion about potential options. She would like to proceed with 2D echocardiogram to evaluate for structural heart disease.  If echo is unrevealing in regards to symptoms, she would like to be referred to EP for consideration of Linq monitor.   Presyncope: Occasional lightheadedness/presyncope associated with palpitations.  As noted above, no clear indication as to triggers and symptoms occur randomly. Very concerning to her. As noted above, will get echo and consider referral to EP for consideration of Linq monitor.    Aortic atherosclerosis: Aortic atherosclerosis noted on prior imaging. She denies chest pain, dyspnea, or other symptoms concerning for angina.  No indication for further ischemic evaluation at this time. LDL above goal of 70, see plan below. Encouraged healthy lifestyle including mostly plant-based diet, 150 minutes moderate intensity exercise each week, and weight loss.    Hyperlipidemia LDL goal < 70: LDL 100 on 03/21/08. Will recheck today.  She is agreeable to consider increase in rosuvastatin if LDL remains elevated.         {Are you ordering a CV Procedure (e.g. stress test, cath, DCCV, TEE, etc)?   Press F2        :[id number]}   Dispo:  No follow-ups on file.   Medication Adjustments/Labs and Tests  Ordered: Current medicines are reviewed at length with the patient today.  Concerns regarding medicines are outlined above.  Tests Ordered: No orders of the defined types were placed in this encounter.  Medication Changes: No orders of the defined types were placed in this encounter.  Elson Clan, PA-C  07/29/2022 8:44 AM    [facility] [address] [place], [place], [place]  [address] Phone: [phone]; Fax: [phone]

## 2022-08-11 ENCOUNTER — Ambulatory Visit: Payer: PPO | Admitting: Physician Assistant

## 2022-08-22 NOTE — Progress Notes (Unsigned)
Electrophysiology Office Note   Date:  08/22/2022   ID:  Amy Glenn 05-Jan-1951, MRN [id number]  PCP:  Farris Has, MD  Cardiologist:  Eldridge Dace Primary Electrophysiologist:  Denya Buckingham Jorja Loa, MD    Chief Complaint: palpitations   History of Present Illness: Amy Glenn is a 72 y.o. female who is being seen today for the evaluation of palpitations at the request of Linwood Dibbles, MD. Presenting today for electrophysiology evaluation.  She has a history significant for aortic atherosclerosis, hyperlipidemia, palpitations.  She was initially seen in 2022 for palpitations.  She wore a cardiac monitor with no obvious cause.  She had a transthoracic echo without acute issue.  Despite this, she remains concerned about palpitations.  She feels that her heart stops beating she has narrowing of her visual field and lightheadedness.  This lasted a few seconds and then resolved.  She has not had syncope but has had near syncope.  She recently presented to the emergency room with similar symptoms though no obvious cause was discovered.  Today, she denies*** symptoms of palpitations, chest pain, shortness of breath, orthopnea, PND, lower extremity edema, claudication, dizziness, presyncope, syncope, bleeding, or neurologic sequela. The patient is tolerating medications without difficulties.    Past Medical History:  Diagnosis Date   Arthritis    Chronic bronchitis (HCC)    Depression    GERD (gastroesophageal reflux disease)    Hypothyroidism    Wears hearing aid    both ears   Past Surgical History:  Procedure Laterality Date   APPENDECTOMY     CHOLECYSTECTOMY  03/16/1980   DILATION AND CURETTAGE OF UTERUS  03/16/2001   fibroids   ECTOPIC PREGNANCY SURGERY  1979x2   on rt   ECTOPIC PREGNANCY SURGERY  03/16/1978   x2 left   EXPLORATORY LAPAROTOMY  10/15/1975   for fertility   HERNIA REPAIR  03/16/1993   lt LA   KNEE ARTHROSCOPY Left 07/12/2012   Procedure: LEFT  KNEE ARTHROSCOPY ;  Surgeon: Velna Ochs, MD;  Location: [facility];  Service: Orthopedics;  Laterality: Left;  Left knee arthroscopy with partial medial menisectomy and chondroplasty   LAPAROSCOPIC ABDOMINAL EXPLORATION  03/16/1976   repair fall tube scarring fertility   REPLACEMENT UNICONDYLAR JOINT KNEE Right 2010   REPLACEMENT UNICONDYLAR JOINT KNEE Left 2008   SHOULDER ARTHROSCOPY  03/17/2003   left   XI ROBOTIC ASSISTED LOWER ANTERIOR RESECTION N/A 05/30/2021   Procedure: XI ROBOTIC ASSISTED LOWER ANTERIOR RESECTION OF COLON RECTOSIGMOID, INTRAOPERATIVE ASSESSMENT OF PERFUSION USING FIREFLY, TAP BLOCK, AND RIGID PROCTOSCOPY;  Surgeon: Karie Soda, MD;  Location: [facility];  Service: General;  Laterality: N/A;     Current Outpatient Medications  Medication Sig Dispense Refill   acidophilus (RISAQUAD) CAPS capsule Take 1 capsule by mouth daily.     amitriptyline (ELAVIL) 10 MG tablet Take 10 mg by mouth at bedtime.     b complex vitamins capsule Take 1 capsule by mouth daily.     cholecalciferol (VITAMIN D3) 25 MCG (1000 UNIT) tablet Take 1,000 Units by mouth daily.     dorzolamide-timolol (COSOPT) 22.3-6.8 MG/ML ophthalmic solution Place 1 drop into both eyes 2 (two) times daily.     EUTHYROX 50 MCG tablet Take 50 mcg by mouth every morning.     lansoprazole (PREVACID) 15 MG capsule Take 15 mg by mouth daily.     MAGNESIUM PO Take 1 tablet by mouth daily.     methocarbamol (ROBAXIN) 500 MG  tablet Take 500-1,000 mg by mouth 4 (four) times daily as needed.     metoCLOPramide (REGLAN) 10 MG tablet Take 1 tablet (10 mg total) by mouth every 6 (six) hours. (Patient taking differently: Take 10 mg by mouth every 6 (six) hours as needed for vomiting.) 15 tablet 0   mometasone (ELOCON) 0.1 % lotion as needed for rash.     Multiple Vitamin (MULTIVITAMIN WITH MINERALS) TABS tablet Take 1 tablet by mouth daily.     PARoxetine (PAXIL) 20 MG tablet Take 20 mg by mouth every  morning.     POTASSIUM CITRATE PO Take 1 tablet by mouth daily.     rosuvastatin (CRESTOR) 10 MG tablet Take one (1) tablet by mouth ( 10 mg) at bedtime. 90 tablet 3   traMADol (ULTRAM) 50 MG tablet Take 1 tablet (50 mg total) by mouth every 6 (six) hours as needed for moderate pain or severe pain. 20 tablet 0   vitamin E 180 MG (400 UNITS) capsule Take 400 Units by mouth daily.     zinc gluconate 50 MG tablet Take 50 mg by mouth daily.     zolpidem (AMBIEN) 5 MG tablet Take 5 mg by mouth at bedtime as needed for sleep.     No current facility-administered medications for this visit.    Allergies:   Patient has no known allergies.   Social History:  The patient  reports that she has never smoked. She has never used smokeless tobacco. She reports current alcohol use. She reports that she does not use drugs.   Family History:  The patient's ***family history includes Hypertension in her sister.    ROS:  Please see the history of present illness.   Otherwise, review of systems is positive for none.   All other systems are reviewed and negative.    PHYSICAL EXAM: VS:  There were no vitals taken for this visit. , BMI There is no height or weight on file to calculate BMI. GEN: Well nourished, well developed, in no acute distress  HEENT: normal  Neck: no JVD, carotid bruits, or masses Cardiac: ***RRR; no murmurs, rubs, or gallops,no edema  Respiratory:  clear to auscultation bilaterally, normal work of breathing GI: soft, nontender, nondistended, + BS MS: no deformity or atrophy  Skin: warm and dry, ***device pocket is well healed Neuro:  Strength and sensation are intact Psych: euthymic mood, full affect  EKG:  EKG {ACTION; IS/IS ZOX:[id number]} ordered today. Personal review of the ekg ordered *** shows ***  ***Device interrogation is reviewed today in detail.  See PaceArt for details.   Recent Labs: 01/06/2022: ALT 58 06/26/2022: BUN 17; Creatinine, Ser 0.79; Hemoglobin 14.7;  Platelets 289; Potassium 3.9; Sodium 138; TSH 4.011    Lipid Panel     Component Value Date/Time   CHOL 176 01/06/2022 1149   TRIG 111 01/06/2022 1149   HDL 60 01/06/2022 1149   CHOLHDL 2.9 01/06/2022 1149   LDLCALC 96 01/06/2022 1149     Wt Readings from Last 3 Encounters:  06/26/22 190 lb (86.2 kg)  01/06/22 189 lb (85.7 kg)  05/23/21 185 lb (83.9 kg)      Other studies Reviewed: Additional studies/ records that were reviewed today include: TTE 01/23/22  Review of the above records today demonstrates:   1. Left ventricular ejection fraction, by estimation, is 60 to 65%. The  left ventricle has normal function. The left ventricle has no regional  wall motion abnormalities. Left ventricular diastolic parameters are  consistent with Grade I diastolic  dysfunction (impaired relaxation). The average left ventricular global  longitudinal strain is -22.9 %. The global longitudinal strain is normal.   2. Right ventricular systolic function is normal. The right ventricular  size is normal. There is normal pulmonary artery systolic pressure.   3. The mitral valve is normal in structure. Trivial mitral valve  regurgitation. No evidence of mitral stenosis.   4. The aortic valve is normal in structure. Aortic valve regurgitation is  mild. No aortic stenosis is present.   5. The inferior vena cava is normal in size with greater than 50%  respiratory variability, suggesting right atrial pressure of 3 mmHg.    ASSESSMENT AND PLAN:  1.  Palpitations: Has had lightheadedness and presyncope.  She has had continued palpitations.  Nothing has been found on cardiac monitor.***  2.  Aortic atherosclerosis: Noted on prior imaging.  Plan per primary cardiology.  3.  Hyperlipidemia: Goal LDL less than 70.  Statin per primary cardiology.    Current medicines are reviewed at length with the patient today.   The patient does not have concerns regarding her medicines.  The following changes  were made today:  {NONE DEFAULTED:18576}  Labs/ tests ordered today include:  No orders of the defined types were placed in this encounter.    Disposition:   FU with Daim[employer] Patmon {gen number 2-44:010272} {Days to years:10300}  Signed, Homero Hyson Jorja Loa, MD  08/22/2022 4:02 PM     [facility] [address] [place] [address] [phone] (office) [phone] (fax)

## 2022-08-24 ENCOUNTER — Ambulatory Visit: Payer: PPO | Attending: Cardiology | Admitting: Cardiology

## 2022-08-24 ENCOUNTER — Encounter: Payer: Self-pay | Admitting: Cardiology

## 2022-08-24 VITALS — BP 124/84 | HR 94 | Ht 67.0 in | Wt 196.0 lb

## 2022-08-24 DIAGNOSIS — R002 Palpitations: Secondary | ICD-10-CM

## 2022-08-24 NOTE — Patient Instructions (Signed)
Medication Instructions:  Your physician recommends that you continue on your current medications as directed. Please refer to the Current Medication list given to you today.  Labwork: None ordered.  Testing/Procedures: None ordered.  Follow-Up:  Your physician wants you to follow-up as needed with Dr. Camnitz.      Implantable Loop Recorder Placement, Care After This sheet gives you information about how to care for yourself after your procedure. Your health care provider may also give you more specific instructions. If you have problems or questions, contact your health care provider. What can I expect after the procedure? After the procedure, it is common to have: Soreness or discomfort near the incision. Some swelling or bruising near the incision.  Follow these instructions at home: Incision care  Monitor your cardiac device site for redness, swelling, and drainage. Call the device clinic at [phone] if you experience these symptoms or fever/chills.  Keep the large square bandage on your site for 24 hours and then you may remove it yourself. Keep the steri-strips underneath in place.   You may shower after 72 hours / 3 days from your procedure with the steri-strips in place. They will usually fall off on their own, or may be removed after 10 days. Pat dry.   Avoid lotions, ointments, or perfumes over your incision until it is well-healed.  Please do not submerge in water until your site is completely healed.   Your device is MRI compatible.   Remote monitoring is used to monitor your cardiac device from home. This monitoring is scheduled every month by our office. It allows us to keep an eye on the function of your device to ensure it is working properly.  If your wound site starts to bleed apply pressure.    For help with the monitor please call [employer] Monitor Support Specialist directly at [phone].    If you have any questions/concerns please call the  device clinic at [phone].  Activity  Return to your normal activities.  General instructions Follow instructions from your health care provider about how to manage your implantable loop recorder and transmit the information. Learn how to activate a recording if this is necessary for your type of device. You may go through a metal detection gate, and you may let someone hold a metal detector over your chest. Show your ID card if needed. Do not have an MRI unless you check with your health care provider first. Take over-the-counter and prescription medicines only as told by your health care provider. Keep all follow-up visits as told by your health care provider. This is important. Contact a health care provider if: You have redness, swelling, or pain around your incision. You have a fever. You have pain that is not relieved by your pain medicine. You have triggered your device because of fainting (syncope) or because of a heartbeat that feels like it is racing, slow, fluttering, or skipping (palpitations). Get help right away if you have: Chest pain. Difficulty breathing. Summary After the procedure, it is common to have soreness or discomfort near the incision. Change your dressing as told by your health care provider. Follow instructions from your health care provider about how to manage your implantable loop recorder and transmit the information. Keep all follow-up visits as told by your health care provider. This is important. This information is not intended to replace advice given to you by your health care provider. Make sure you discuss any questions you have with your health care provider. Document   Released: 02/11/2015 Document Revised: 04/17/2017 Document Reviewed: 04/17/2017 Elsevier Patient Education  2020 Elsevier Inc.   

## 2022-08-31 ENCOUNTER — Telehealth: Payer: Self-pay

## 2022-08-31 NOTE — Telephone Encounter (Signed)
Request to adjust tachy alert setting to greater than 122 for repeat non-actionable events since implant on 6/10.:  08/29/22 ILR alert for tachy, narrow complex, 16sec in duration, HR 118 Programmed >115, change tachy alert to 122 for repeat non-actionable alerts Route to triage for awareness LA, CVRS  08/27/22 ILR alert for tachy, narrow complex, 13sec in duration, HR 120 Previous tachy routed LA, CVRS  08/24/22: ILR alert for 4 tachy events Events occurred 6/10 18:21-18:27, longest duration 4sec, HR's 118-120 Tachy programmed >115 Route to triage LA, CVRS

## 2022-09-01 ENCOUNTER — Telehealth: Payer: Self-pay

## 2022-09-01 NOTE — Telephone Encounter (Signed)
ILR alert for symptom correlating with tachy event  Dizzy / Lightheaded, Other  A little active  I felt a little light headed and could tell my heart wasn't beating correctly.  Correlating with tachy event 6/17 @ 15:52, EGM shows narrow regular rhythm, duration 10sec, HR 194

## 2022-09-02 MED ORDER — METOPROLOL SUCCINATE ER 50 MG PO TB24: 50.0000 mg | ORAL_TABLET | Freq: Every day | ORAL | 3 refills | Status: DC

## 2022-09-02 NOTE — Telephone Encounter (Signed)
Call back received from Pt.  Pt advised to start Toprol 50 mg daily per Dr. Elberta Fortis.  Prescription sent.  All questions answered.  Pt was grateful for call back.  Will call with any questions/issues.

## 2022-09-02 NOTE — Telephone Encounter (Signed)
Left message requesting  call back.

## 2022-09-28 ENCOUNTER — Ambulatory Visit (INDEPENDENT_AMBULATORY_CARE_PROVIDER_SITE_OTHER): Payer: PPO

## 2022-09-28 DIAGNOSIS — R002 Palpitations: Secondary | ICD-10-CM

## 2022-09-29 LAB — CUP PACEART REMOTE DEVICE CHECK
Date Time Interrogation Session: 20240714231323
Implantable Pulse Generator Implant Date: 20240601

## 2022-10-12 NOTE — Progress Notes (Signed)
Carelink Summary Report / Loop Recorder 

## 2022-10-13 MED ORDER — METOPROLOL SUCCINATE ER 100 MG PO TB24: 100.0000 mg | ORAL_TABLET | Freq: Every day | ORAL | 1 refills | Status: DC

## 2022-10-13 NOTE — Addendum Note (Signed)
Addended by: Baird Lyons on: 10/13/2022 04:34 PM   Modules accepted: Orders

## 2022-10-14 DIAGNOSIS — D225 Melanocytic nevi of trunk: Secondary | ICD-10-CM | POA: Diagnosis not present

## 2022-10-14 DIAGNOSIS — L820 Inflamed seborrheic keratosis: Secondary | ICD-10-CM | POA: Diagnosis not present

## 2022-10-14 DIAGNOSIS — Z85828 Personal history of other malignant neoplasm of skin: Secondary | ICD-10-CM | POA: Diagnosis not present

## 2022-10-14 DIAGNOSIS — L814 Other melanin hyperpigmentation: Secondary | ICD-10-CM | POA: Diagnosis not present

## 2022-10-14 DIAGNOSIS — L821 Other seborrheic keratosis: Secondary | ICD-10-CM | POA: Diagnosis not present

## 2022-10-26 ENCOUNTER — Telehealth: Payer: Self-pay

## 2022-10-26 NOTE — Telephone Encounter (Signed)
Following alert received from [employer] received for symptom Heart fluttering  Not at all active  I had a sensation in my chest that my heart was doing something strange. EGM shows NSR with 5-6 tachy beats prior to activation.  Pt. has hx of tachy episodes.  Patient reports chest heaviness during symptom event. No other associating symptoms. Compliance with Toprol-XL 100 mg daily. Patient does not have gen cards on file. Advised pt of ED precautions and agreeable to plan.   Routing to Dr. Elberta Fortis for review.

## 2022-10-29 NOTE — Telephone Encounter (Signed)
Sent to ep schedling to schedule w/ APP

## 2022-11-02 ENCOUNTER — Ambulatory Visit (INDEPENDENT_AMBULATORY_CARE_PROVIDER_SITE_OTHER): Payer: PPO

## 2022-11-02 DIAGNOSIS — R002 Palpitations: Secondary | ICD-10-CM

## 2022-11-03 LAB — CUP PACEART REMOTE DEVICE CHECK
Date Time Interrogation Session: 20240818231535
Implantable Pulse Generator Implant Date: 20240601

## 2022-11-10 DIAGNOSIS — R5383 Other fatigue: Secondary | ICD-10-CM | POA: Diagnosis not present

## 2022-11-10 DIAGNOSIS — R7309 Other abnormal glucose: Secondary | ICD-10-CM | POA: Diagnosis not present

## 2022-11-10 DIAGNOSIS — E785 Hyperlipidemia, unspecified: Secondary | ICD-10-CM | POA: Diagnosis not present

## 2022-11-10 DIAGNOSIS — E559 Vitamin D deficiency, unspecified: Secondary | ICD-10-CM | POA: Diagnosis not present

## 2022-11-10 DIAGNOSIS — E039 Hypothyroidism, unspecified: Secondary | ICD-10-CM | POA: Diagnosis not present

## 2022-11-10 DIAGNOSIS — Z6830 Body mass index (BMI) 30.0-30.9, adult: Secondary | ICD-10-CM | POA: Diagnosis not present

## 2022-11-10 DIAGNOSIS — Z23 Encounter for immunization: Secondary | ICD-10-CM | POA: Diagnosis not present

## 2022-11-10 DIAGNOSIS — Z0000 Encounter for general adult medical examination without abnormal findings: Secondary | ICD-10-CM | POA: Diagnosis not present

## 2022-11-10 DIAGNOSIS — M6281 Muscle weakness (generalized): Secondary | ICD-10-CM | POA: Diagnosis not present

## 2022-11-12 NOTE — Progress Notes (Signed)
Carelink Summary Report / Loop Recorder 

## 2022-11-20 NOTE — Telephone Encounter (Signed)
Informed that she is already scheduled to see EP PA next Friday. She appreciates the call

## 2022-11-27 ENCOUNTER — Ambulatory Visit: Payer: PPO | Attending: Physician Assistant | Admitting: Physician Assistant

## 2022-11-27 ENCOUNTER — Encounter: Payer: Self-pay | Admitting: Physician Assistant

## 2022-11-27 VITALS — BP 106/68 | HR 79 | Ht 67.0 in | Wt 199.6 lb

## 2022-11-27 DIAGNOSIS — R002 Palpitations: Secondary | ICD-10-CM

## 2022-11-27 DIAGNOSIS — I471 Supraventricular tachycardia, unspecified: Secondary | ICD-10-CM | POA: Diagnosis not present

## 2022-11-27 DIAGNOSIS — Z9889 Other specified postprocedural states: Secondary | ICD-10-CM

## 2022-11-27 MED ORDER — METOPROLOL SUCCINATE ER 50 MG PO TB24: 50.0000 mg | ORAL_TABLET | Freq: Every day | ORAL | 2 refills | Status: DC

## 2022-11-27 NOTE — Progress Notes (Signed)
Cardiology Office Note:  .   Date:  11/27/2022  ID:  Amy Glenn, DOB 05-Aug-1950, MRN [id number] PCP: Farris Has, MD  [facility] Providers Cardiologist:  Dr. Eldridge Dace EP: Dr. Elberta Fortis  History of Present Illness: Amy Glenn is a 72 y.o. female w/PMHx of hypothyroidism, GERD  Hx of palpitations >> 2022  monitor noted PACs, PVCs, no arrhythmias  Saw cardiology team 01/06/22, ongoing palpitations, worrisome to the patient, associated with lightheadedness, near syncope, random without a clear trigger Planned for an echo and EP evaluation/consider loop  LVEF 60-65%, no WMA, no VHD  She saw Dr. Elberta Fortis 08/24/22, reported on gping palpitations, has had ER visits without clear etiology, sense of her heart slowing Underwent loop implant  June device alert for an SVT (10 seconds) associated with lightheadedness, Dr. Elberta Fortis recommended Toprol 50mg  daily  Many phone/chart messages ppt reported improved but ongoing palps >> 100mg  >> fatigue  >> pending APP visit  Aug pt reports chest pressure associated with a symptom event sent in, SR, very short SVT > planned to see APP  Today's visit is scheduled to evaluate tachycardia/ILR  ROS:   She is doing pretty well Her initial symptom was a sense that her heart was stopping/slowing for a second or so making her lightheaded. When she was called initially about loop findings, was not the same symptoms, perhaps but felt something On the Toprol, the initial symptom is resolved Notes with ongoing palps >> increased the Toprol to 100 After this she has felt pretty fatigued, tired, weak. She is trying to lose weight trying get in the habit of exercise, but just has no energy.  When asked about CP, this is a funny feeling/perhaps some quick beats that she feels particularly when bending forward. None otherwise SHe has started walking for exercise, no CP with exercise or exertion No near syncope or syncope  Pending  consult at the wellness center, looking forward to their help with weight loss    Device information MDT LNQ II implanted 08/24/22, surveillance of palpitations, near syncope   Studies Reviewed: Marland Kitchen    EKG done today and reviewed by myself:  SR 77bpm, normal intervals, no significant changes from priors (going back to 2022)  DEVICE interrogation done today and reviewed by myself + for symptom and tachy episodes In my review, symptom episodes are associated with SR/normal rates One with some ectopy PAC/PVCs Noted very brief SVTs with and without symptom episodes No brady   TTE 01/23/22   1. Left ventricular ejection fraction, by estimation, is 60 to 65%. The  left ventricle has normal function. The left ventricle has no regional  wall motion abnormalities. Left ventricular diastolic parameters are  consistent with Grade I diastolic  dysfunction (impaired relaxation). The average left ventricular global  longitudinal strain is -22.9 %. The global longitudinal strain is normal.   2. Right ventricular systolic function is normal. The right ventricular  size is normal. There is normal pulmonary artery systolic pressure.   3. The mitral valve is normal in structure. Trivial mitral valve  regurgitation. No evidence of mitral stenosis.   4. The aortic valve is normal in structure. Aortic valve regurgitation is  mild. No aortic stenosis is present.   5. The inferior vena cava is normal in size with greater than 50%  respiratory variability, suggesting right atrial pressure of 3 mmHg.    Cardiac Monitor 09/12/20 Normal sinus rhythm with rare PACs and PVCs. Single brief, run of  PACs lasting just a few seconds. No associated symptoms. No atrial fibrillation. Patient had symptoms with sinus tachycardia, HR 105. No sustained pathologic arrhythmias.   Risk Assessment/Calculations:    Physical Exam:   VS:  There were no vitals taken for this visit.   Wt Readings from Last 3 Encounters:   08/24/22 196 lb (88.9 kg)  06/26/22 190 lb (86.2 kg)  01/06/22 189 lb (85.7 kg)    GEN: Well nourished, well developed in no acute distress NECK: No JVD; No carotid bruits CARDIAC: RRR, no murmurs, rubs, gallops RESPIRATORY:  CTA b/l without rales, wheezing or rhonchi  ABDOMEN: Soft, non-tender, non-distended EXTREMITIES:  No edema; No deformity    ASSESSMENT AND PLAN: .    Palpitations SVT 3.    ectopy Felt well on Toprol 50mg  daily Will resume this dose She will continue exercise and her weight loss efforts Discussed if she has more SVTs, longer could consider alternative management options (Higher HS dosing of her BB, CCB, AAD, ablation if sustained episode develope)     Dispo: back in 2 mo sooner if needed   Signed, Sheilah Pigeon, PA-C

## 2022-11-27 NOTE — Patient Instructions (Signed)
Medication Instructions:   START  TAKING :   METOPROLOL 50 MG ONCE A DAY   *If you need a refill on your cardiac medications before your next appointment, please call your pharmacy*   Lab Work: NONE ORDERED  TODAY   If you have labs (blood work) drawn today and your tests are completely normal, you will receive your results only by: MyChart Message (if you have MyChart) OR A paper copy in the mail If you have any lab test that is abnormal or we need to change your treatment, we will call you to review the results.   Testing/Procedures: NONE ORDERED  TODAY   Follow-Up: At [facility], you and your health needs are our priority.  As part of our continuing mission to provide you with exceptional heart care, we have created designated Provider Care Teams.  These Care Teams include your primary Cardiologist (physician) and Advanced Practice Providers (APPs -  Physician Assistants and Nurse Practitioners) who all work together to provide you with the care you need, when you need it.  We recommend signing up for the patient portal called "MyChart".  Sign up information is provided on this After Visit Summary.  MyChart is used to connect with patients for Virtual Visits (Telemedicine).  Patients are able to view lab/test results, encounter notes, upcoming appointments, etc.  Non-urgent messages can be sent to your provider as well.   To learn more about what you can do with MyChart, go to [email or web address].    Your next appointment:   2 month(s)  Provider:   You may see   Francis Dowse, PA-C  Other Instructions

## 2022-12-07 ENCOUNTER — Ambulatory Visit (INDEPENDENT_AMBULATORY_CARE_PROVIDER_SITE_OTHER): Payer: PPO

## 2022-12-07 DIAGNOSIS — R002 Palpitations: Secondary | ICD-10-CM | POA: Diagnosis not present

## 2022-12-08 LAB — CUP PACEART REMOTE DEVICE CHECK
Date Time Interrogation Session: 20240920230556
Implantable Pulse Generator Implant Date: 20240601

## 2022-12-21 NOTE — Progress Notes (Signed)
Carelink Summary Report / Loop Recorder 

## 2022-12-29 ENCOUNTER — Other Ambulatory Visit: Payer: Self-pay | Admitting: Nurse Practitioner

## 2023-01-11 ENCOUNTER — Ambulatory Visit (INDEPENDENT_AMBULATORY_CARE_PROVIDER_SITE_OTHER): Payer: PPO

## 2023-01-11 DIAGNOSIS — R002 Palpitations: Secondary | ICD-10-CM | POA: Diagnosis not present

## 2023-01-12 LAB — CUP PACEART REMOTE DEVICE CHECK
Date Time Interrogation Session: 20241027231712
Implantable Pulse Generator Implant Date: 20240601

## 2023-01-20 DIAGNOSIS — E7800 Pure hypercholesterolemia, unspecified: Secondary | ICD-10-CM | POA: Diagnosis not present

## 2023-01-20 DIAGNOSIS — Z6830 Body mass index (BMI) 30.0-30.9, adult: Secondary | ICD-10-CM | POA: Diagnosis not present

## 2023-01-20 DIAGNOSIS — K760 Fatty (change of) liver, not elsewhere classified: Secondary | ICD-10-CM | POA: Diagnosis not present

## 2023-01-20 DIAGNOSIS — R296 Repeated falls: Secondary | ICD-10-CM | POA: Diagnosis not present

## 2023-01-20 DIAGNOSIS — I700 Atherosclerosis of aorta: Secondary | ICD-10-CM | POA: Diagnosis not present

## 2023-01-20 DIAGNOSIS — Z1331 Encounter for screening for depression: Secondary | ICD-10-CM | POA: Diagnosis not present

## 2023-01-20 DIAGNOSIS — R0602 Shortness of breath: Secondary | ICD-10-CM | POA: Diagnosis not present

## 2023-01-20 DIAGNOSIS — K219 Gastro-esophageal reflux disease without esophagitis: Secondary | ICD-10-CM | POA: Diagnosis not present

## 2023-01-20 DIAGNOSIS — E66811 Obesity, class 1: Secondary | ICD-10-CM | POA: Diagnosis not present

## 2023-01-20 DIAGNOSIS — E039 Hypothyroidism, unspecified: Secondary | ICD-10-CM | POA: Diagnosis not present

## 2023-01-20 DIAGNOSIS — R7303 Prediabetes: Secondary | ICD-10-CM | POA: Diagnosis not present

## 2023-01-29 ENCOUNTER — Encounter: Payer: Self-pay | Admitting: Physician Assistant

## 2023-01-29 ENCOUNTER — Ambulatory Visit: Payer: PPO | Attending: Physician Assistant | Admitting: Physician Assistant

## 2023-01-29 VITALS — BP 100/70 | Temp 80.0°F | Ht 67.0 in | Wt 187.8 lb

## 2023-01-29 DIAGNOSIS — I471 Supraventricular tachycardia, unspecified: Secondary | ICD-10-CM | POA: Diagnosis not present

## 2023-01-29 DIAGNOSIS — R002 Palpitations: Secondary | ICD-10-CM | POA: Diagnosis not present

## 2023-01-29 DIAGNOSIS — Z95818 Presence of other cardiac implants and grafts: Secondary | ICD-10-CM | POA: Diagnosis not present

## 2023-01-29 NOTE — Progress Notes (Signed)
Cardiology Office Note:  .   Date:  01/29/2023  ID:  Amy Glenn, DOB 09/05/1950, MRN [id number] PCP: Farris Has, MD  [facility] Providers Cardiologist:  Dr. Eldridge Dace EP: Dr. Elberta Fortis  History of Present Illness: Amy Glenn is a 72 y.o. female w/PMHx of hypothyroidism, GERD, SVT  Hx of palpitations >> 2022  monitor noted PACs, PVCs, no arrhythmias  Saw cardiology team 01/06/22, ongoing palpitations, worrisome to the patient, associated with lightheadedness, near syncope, random without a clear trigger Planned for an echo and EP evaluation/consider loop  LVEF 60-65%, no WMA, no VHD  She saw Dr. Elberta Fortis 08/24/22, reported on gping palpitations, has had ER visits without clear etiology, sense of her heart slowing Underwent loop implant  June device alert for an SVT (10 seconds) associated with lightheadedness, Dr. Elberta Fortis recommended Toprol 50mg  daily  Many phone/chart messages ppt reported improved but ongoing palps >> 100mg  >> fatigue  >> pending APP visit  Aug pt reports chest pressure associated with a symptom event sent in, SR, very short SVT > planned to see APP  I saw her 11/27/22, working on exercise, weight  loss, symptoms somewhat improved with Toprol and continued, planned to continue exercise/weight loss and f/u with consideration of +/- ablation, increased dose, alternative medication strategies  Today's visit is scheduled as a 2 mo f/u  ROS:   She self reduced Toprol dose back to 25mg  daily feeling very weak with 50mg  She reports some degree of chronic palpitations though no sustained tachy-palpitations since we see her last  No CP, no near syncope or syncope  She noted that fro quite some time, she was waking fatigued, often like she needed a nap despite sleeping all night Recently read an article that eating before bed acn affect quality of sleep, given her work hours regullrly she was eating dinner and going to bed quickly after. This  week she started  to eat a big lunch and little if much at all in the evenings and immediately, felt much better rested in the mornings!  (Also noted she is dreaming) She has had some success in her weight loss journey   Device information MDT LNQ II implanted 08/24/22, surveillance of palpitations, near syncope   Studies Reviewed: Marland Kitchen    EKG not done today  DEVICE interrogation done today and reviewed by myself Carelink reviewed One tachy episode, 16 seconds, is an SVT   TTE 01/23/22   1. Left ventricular ejection fraction, by estimation, is 60 to 65%. The  left ventricle has normal function. The left ventricle has no regional  wall motion abnormalities. Left ventricular diastolic parameters are  consistent with Grade I diastolic  dysfunction (impaired relaxation). The average left ventricular global  longitudinal strain is -22.9 %. The global longitudinal strain is normal.   2. Right ventricular systolic function is normal. The right ventricular  size is normal. There is normal pulmonary artery systolic pressure.   3. The mitral valve is normal in structure. Trivial mitral valve  regurgitation. No evidence of mitral stenosis.   4. The aortic valve is normal in structure. Aortic valve regurgitation is  mild. No aortic stenosis is present.   5. The inferior vena cava is normal in size with greater than 50%  respiratory variability, suggesting right atrial pressure of 3 mmHg.    Cardiac Monitor 09/12/20 Normal sinus rhythm with rare PACs and PVCs. Single brief, run of PACs lasting just a few seconds. No associated symptoms. No  atrial fibrillation. Patient had symptoms with sinus tachycardia, HR 105. No sustained pathologic arrhythmias.   Risk Assessment/Calculations:    Physical Exam:   VS:  There were no vitals taken for this visit.   Wt Readings from Last 3 Encounters:  11/27/22 199 lb 9.6 oz (90.5 kg)  08/24/22 196 lb (88.9 kg)  06/26/22 190 lb (86.2 kg)    GEN: Well  nourished, well developed in no acute distress NECK: No JVD; No carotid bruits CARDIAC:  RRR, no murmurs, rubs, gallops RESPIRATORY: CTA b/l without rales, wheezing or rhonchi  ABDOMEN: Soft, non-tender, non-distended EXTREMITIES:  No edema; No deformity    ASSESSMENT AND PLAN: .    Palpitations SVT 3.    ectopy One brief SVT, she was unaware of (occurred at 00:30)  Given her improved sleep habits/improved sleep, will monitor on the 25mg   Next step would be trying dilt or add on flecainide perhaps Consider ablation if burden is high She will let us know in the next few weeks or so how she is doing  Dispo: otherwise back in 3-11mo, sooner if needed   Signed, Sheilah Pigeon, PA-C

## 2023-01-29 NOTE — Patient Instructions (Signed)
Medication Instructions:  Your physician recommends that you continue on your current medications as directed. Please refer to the Current Medication list given to you today.  *If you need a refill on your cardiac medications before your next appointment, please call your pharmacy*   Lab Work: None ordered   Testing/Procedures: None ordered   Follow-Up: At [facility], you and your health needs are our priority.  As part of our continuing mission to provide you with exceptional heart care, we have created designated Provider Care Teams.  These Care Teams include your primary Cardiologist (physician) and Advanced Practice Providers (APPs -  Physician Assistants and Nurse Practitioners) who all work together to provide you with the care you need, when you need it.  Your next appointment:   3 - 4 month(s)  The format for your next appointment:   In Person  Provider:   Francis Dowse, PA-C(or Dr. Elberta Fortis  Thank you for choosing [facility]!!   [phone]

## 2023-02-01 NOTE — Progress Notes (Signed)
Carelink Summary Report / Loop Recorder 

## 2023-02-03 DIAGNOSIS — K760 Fatty (change of) liver, not elsewhere classified: Secondary | ICD-10-CM | POA: Diagnosis not present

## 2023-02-03 DIAGNOSIS — E7800 Pure hypercholesterolemia, unspecified: Secondary | ICD-10-CM | POA: Diagnosis not present

## 2023-02-03 DIAGNOSIS — E66811 Obesity, class 1: Secondary | ICD-10-CM | POA: Diagnosis not present

## 2023-02-03 DIAGNOSIS — Z6830 Body mass index (BMI) 30.0-30.9, adult: Secondary | ICD-10-CM | POA: Diagnosis not present

## 2023-02-03 DIAGNOSIS — R7303 Prediabetes: Secondary | ICD-10-CM | POA: Diagnosis not present

## 2023-02-03 DIAGNOSIS — I700 Atherosclerosis of aorta: Secondary | ICD-10-CM | POA: Diagnosis not present

## 2023-02-14 LAB — CUP PACEART REMOTE DEVICE CHECK
Date Time Interrogation Session: 20241129230842
Implantable Pulse Generator Implant Date: 20240601

## 2023-02-15 ENCOUNTER — Ambulatory Visit (INDEPENDENT_AMBULATORY_CARE_PROVIDER_SITE_OTHER): Payer: PPO

## 2023-02-15 DIAGNOSIS — R002 Palpitations: Secondary | ICD-10-CM

## 2023-02-17 DIAGNOSIS — R7303 Prediabetes: Secondary | ICD-10-CM | POA: Diagnosis not present

## 2023-02-17 DIAGNOSIS — R932 Abnormal findings on diagnostic imaging of liver and biliary tract: Secondary | ICD-10-CM | POA: Diagnosis not present

## 2023-02-17 DIAGNOSIS — E039 Hypothyroidism, unspecified: Secondary | ICD-10-CM | POA: Diagnosis not present

## 2023-02-17 DIAGNOSIS — Z6830 Body mass index (BMI) 30.0-30.9, adult: Secondary | ICD-10-CM | POA: Diagnosis not present

## 2023-02-17 DIAGNOSIS — K760 Fatty (change of) liver, not elsewhere classified: Secondary | ICD-10-CM | POA: Diagnosis not present

## 2023-02-17 DIAGNOSIS — E66811 Obesity, class 1: Secondary | ICD-10-CM | POA: Diagnosis not present

## 2023-02-17 DIAGNOSIS — R296 Repeated falls: Secondary | ICD-10-CM | POA: Diagnosis not present

## 2023-02-17 DIAGNOSIS — E785 Hyperlipidemia, unspecified: Secondary | ICD-10-CM | POA: Diagnosis not present

## 2023-02-17 DIAGNOSIS — E559 Vitamin D deficiency, unspecified: Secondary | ICD-10-CM | POA: Diagnosis not present

## 2023-02-24 DIAGNOSIS — R932 Abnormal findings on diagnostic imaging of liver and biliary tract: Secondary | ICD-10-CM | POA: Diagnosis not present

## 2023-02-24 DIAGNOSIS — E039 Hypothyroidism, unspecified: Secondary | ICD-10-CM | POA: Diagnosis not present

## 2023-02-26 DIAGNOSIS — H02834 Dermatochalasis of left upper eyelid: Secondary | ICD-10-CM | POA: Diagnosis not present

## 2023-02-26 DIAGNOSIS — Z961 Presence of intraocular lens: Secondary | ICD-10-CM | POA: Diagnosis not present

## 2023-02-26 DIAGNOSIS — H02403 Unspecified ptosis of bilateral eyelids: Secondary | ICD-10-CM | POA: Diagnosis not present

## 2023-02-26 DIAGNOSIS — H40013 Open angle with borderline findings, low risk, bilateral: Secondary | ICD-10-CM | POA: Diagnosis not present

## 2023-02-26 DIAGNOSIS — H02831 Dermatochalasis of right upper eyelid: Secondary | ICD-10-CM | POA: Diagnosis not present

## 2023-02-26 DIAGNOSIS — H26493 Other secondary cataract, bilateral: Secondary | ICD-10-CM | POA: Diagnosis not present

## 2023-02-26 DIAGNOSIS — H526 Other disorders of refraction: Secondary | ICD-10-CM | POA: Diagnosis not present

## 2023-02-26 DIAGNOSIS — H04123 Dry eye syndrome of bilateral lacrimal glands: Secondary | ICD-10-CM | POA: Diagnosis not present

## 2023-03-01 ENCOUNTER — Telehealth: Payer: Self-pay | Admitting: *Deleted

## 2023-03-01 MED ORDER — METOPROLOL TARTRATE 25 MG PO TABS: 25.0000 mg | ORAL_TABLET | Freq: Three times a day (TID) | ORAL | 0 refills | Status: DC | PRN

## 2023-03-01 NOTE — Telephone Encounter (Signed)
Spoke with patient  aware of medication changes to continue to stay off Succinate 25 mg and take Tartrate 25 mg as needed for palpitations. Med list updated and sent in to pharmacy

## 2023-03-03 ENCOUNTER — Other Ambulatory Visit: Payer: Self-pay

## 2023-03-03 ENCOUNTER — Emergency Department (HOSPITAL_BASED_OUTPATIENT_CLINIC_OR_DEPARTMENT_OTHER)
Admission: EM | Admit: 2023-03-03 | Discharge: 2023-03-03 | Disposition: A | Discharge status: Home / Self Care | Payer: PPO | Attending: Emergency Medicine | Admitting: Emergency Medicine

## 2023-03-03 ENCOUNTER — Encounter (HOSPITAL_BASED_OUTPATIENT_CLINIC_OR_DEPARTMENT_OTHER): Payer: Self-pay | Admitting: Emergency Medicine

## 2023-03-03 ENCOUNTER — Emergency Department (HOSPITAL_BASED_OUTPATIENT_CLINIC_OR_DEPARTMENT_OTHER): Payer: PPO

## 2023-03-03 DIAGNOSIS — S0990XA Unspecified injury of head, initial encounter: Secondary | ICD-10-CM | POA: Diagnosis not present

## 2023-03-03 DIAGNOSIS — Y92410 Unspecified street and highway as the place of occurrence of the external cause: Secondary | ICD-10-CM | POA: Diagnosis not present

## 2023-03-03 DIAGNOSIS — M542 Cervicalgia: Secondary | ICD-10-CM | POA: Diagnosis not present

## 2023-03-03 DIAGNOSIS — E039 Hypothyroidism, unspecified: Secondary | ICD-10-CM | POA: Diagnosis not present

## 2023-03-03 DIAGNOSIS — S161XXA Strain of muscle, fascia and tendon at neck level, initial encounter: Secondary | ICD-10-CM | POA: Insufficient documentation

## 2023-03-03 DIAGNOSIS — E041 Nontoxic single thyroid nodule: Secondary | ICD-10-CM | POA: Diagnosis not present

## 2023-03-03 DIAGNOSIS — R519 Headache, unspecified: Secondary | ICD-10-CM | POA: Diagnosis not present

## 2023-03-03 MED ORDER — LIDOCAINE 5 % EX PTCH
1.0000 | MEDICATED_PATCH | CUTANEOUS | Status: DC
  Administered 2023-03-03: 1 via TRANSDERMAL
  Filled 2023-03-03: qty 1

## 2023-03-03 MED ORDER — CYCLOBENZAPRINE HCL 10 MG PO TABS: 10.0000 mg | ORAL_TABLET | Freq: Two times a day (BID) | ORAL | 0 refills | Status: DC | PRN

## 2023-03-03 MED ORDER — LIDOCAINE 5 % EX PTCH: 1.0000 | MEDICATED_PATCH | CUTANEOUS | 0 refills | Status: DC
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
## 2023-03-04 DIAGNOSIS — Z6830 Body mass index (BMI) 30.0-30.9, adult: Secondary | ICD-10-CM | POA: Diagnosis not present

## 2023-03-04 DIAGNOSIS — E6609 Other obesity due to excess calories: Secondary | ICD-10-CM | POA: Diagnosis not present

## 2023-03-04 DIAGNOSIS — R296 Repeated falls: Secondary | ICD-10-CM | POA: Diagnosis not present

## 2023-03-04 DIAGNOSIS — R7303 Prediabetes: Secondary | ICD-10-CM | POA: Diagnosis not present

## 2023-03-04 DIAGNOSIS — E66811 Obesity, class 1: Secondary | ICD-10-CM | POA: Diagnosis not present

## 2023-03-22 ENCOUNTER — Ambulatory Visit (INDEPENDENT_AMBULATORY_CARE_PROVIDER_SITE_OTHER): Payer: PPO

## 2023-03-22 DIAGNOSIS — R002 Palpitations: Secondary | ICD-10-CM | POA: Diagnosis not present

## 2023-03-23 LAB — CUP PACEART REMOTE DEVICE CHECK
Date Time Interrogation Session: 20250105231907
Implantable Pulse Generator Implant Date: 20240601

## 2023-04-16 ENCOUNTER — Emergency Department (HOSPITAL_COMMUNITY)
Admission: EM | Admit: 2023-04-16 | Discharge: 2023-04-16 | Disposition: A | Discharge status: Home / Self Care | Payer: PPO | Attending: Emergency Medicine | Admitting: Emergency Medicine

## 2023-04-16 ENCOUNTER — Emergency Department (HOSPITAL_COMMUNITY): Payer: PPO

## 2023-04-16 ENCOUNTER — Encounter (HOSPITAL_COMMUNITY): Payer: Self-pay

## 2023-04-16 ENCOUNTER — Other Ambulatory Visit: Payer: Self-pay

## 2023-04-16 DIAGNOSIS — J101 Influenza due to other identified influenza virus with other respiratory manifestations: Secondary | ICD-10-CM

## 2023-04-16 DIAGNOSIS — J09X2 Influenza due to identified novel influenza A virus with other respiratory manifestations: Secondary | ICD-10-CM | POA: Insufficient documentation

## 2023-04-16 DIAGNOSIS — Z20822 Contact with and (suspected) exposure to covid-19: Secondary | ICD-10-CM | POA: Diagnosis not present

## 2023-04-16 DIAGNOSIS — R531 Weakness: Secondary | ICD-10-CM | POA: Insufficient documentation

## 2023-04-16 DIAGNOSIS — R059 Cough, unspecified: Secondary | ICD-10-CM | POA: Diagnosis present

## 2023-04-16 LAB — I-STAT CG4 LACTIC ACID, ED: Lactic Acid, Venous: 0.9 mmol/L (ref 0.5–1.9)

## 2023-04-16 LAB — COMPREHENSIVE METABOLIC PANEL
ALT: 69 U/L — ABNORMAL HIGH (ref 0–44)
AST: 71 U/L — ABNORMAL HIGH (ref 15–41)
Albumin: 4.1 g/dL (ref 3.5–5.0)
Alkaline Phosphatase: 50 U/L (ref 38–126)
Anion gap: 9 (ref 5–15)
BUN: 13 mg/dL (ref 8–23)
CO2: 23 mmol/L (ref 22–32)
Calcium: 9 mg/dL (ref 8.9–10.3)
Chloride: 105 mmol/L (ref 98–111)
Creatinine, Ser: 0.71 mg/dL (ref 0.44–1.00)
GFR, Estimated: >60 mL/min (ref >60)
Glucose, Bld: 121 mg/dL — ABNORMAL HIGH (ref 70–99)
Potassium: 3.9 mmol/L (ref 3.5–5.1)
Sodium: 137 mmol/L (ref 135–145)
Total Bilirubin: 0.6 mg/dL (ref 0.0–1.2)
Total Protein: 6.9 g/dL (ref 6.5–8.1)

## 2023-04-16 LAB — CBC
HCT: 45.4 % (ref 36.0–46.0)
Hemoglobin: 15.2 g/dL — ABNORMAL HIGH (ref 12.0–15.0)
MCH: 30 pg (ref 26.0–34.0)
MCHC: 33.5 g/dL (ref 30.0–36.0)
MCV: 89.5 fL (ref 80.0–100.0)
Platelets: 304 10*3/uL (ref 150–400)
RBC: 5.07 MIL/uL (ref 3.87–5.11)
RDW: 13.5 % (ref 11.5–15.5)
WBC: 6.4 10*3/uL (ref 4.0–10.5)
nRBC: 0 % (ref 0.0–0.2)

## 2023-04-16 LAB — RESP PANEL BY RT-PCR (RSV, FLU A&B, COVID)  RVPGX2
Influenza A by PCR: POSITIVE — AB
Influenza B by PCR: NEGATIVE
Resp Syncytial Virus by PCR: NEGATIVE
SARS Coronavirus 2 by RT PCR: NEGATIVE

## 2023-04-16 LAB — LIPASE, BLOOD: Lipase: 28 U/L (ref 11–51)

## 2023-04-16 LAB — CK: Total CK: 129 U/L (ref 38–234)

## 2023-04-16 MED ORDER — ONDANSETRON 4 MG PO TBDP
4.0000 mg | ORAL_TABLET | Freq: Once | ORAL | Status: AC
  Administered 2023-04-16: 4 mg via ORAL
  Filled 2023-04-16: qty 1

## 2023-04-16 MED ORDER — GUAIFENESIN ER 1200 MG PO TB12: 1.0000 | ORAL_TABLET | Freq: Two times a day (BID) | ORAL | 0 refills | Status: DC

## 2023-04-16 MED ORDER — ONDANSETRON 8 MG PO TBDP: 8.0000 mg | ORAL_TABLET | Freq: Three times a day (TID) | ORAL | 0 refills | Status: DC | PRN

## 2023-04-16 MED ORDER — BENZONATATE 100 MG PO CAPS: 100.0000 mg | ORAL_CAPSULE | Freq: Three times a day (TID) | ORAL | 0 refills | Status: DC

## 2023-04-16 MED ORDER — SODIUM CHLORIDE 0.9 % IV BOLUS
1000.0000 mL | Freq: Once | INTRAVENOUS | Status: AC
  Administered 2023-04-16: 1000 mL via INTRAVENOUS

## 2023-04-16 NOTE — Discharge Instructions (Signed)
Rest and drink plenty of fluids.  Take Tylenol for aches and pains.  The Zofran is for nausea.  The guaifenesin and Tessalon are to help with your cough and congestion.  With your doctor next week to be rechecked if you are not improving.  Return to the ED as needed for worsening symptoms

## 2023-04-16 NOTE — ED Provider Notes (Signed)
[place] EMERGENCY DEPARTMENT AT [facility] Provider Note   CSN: [id number] Arrival date & time: 04/16/23  7829     History  Chief Complaint  Patient presents with   Emesis    Amy Glenn is a 73 y.o. female.   Emesis    Patient has a history of irritable bowel syndrome, depression, abnormal liver function tests.  Patient states in the last week or so she has been having some trouble with cough.  On the last day she started having some episodes of nausea and vomiting.  She does not think she has had any fevers.  Today she had a couple episodes where she felt very weak and ended up falling.  Patient denies any injuries.  No loss of consciousness.  Patient states eventually she was able to get up but she felt very weak with standing and has had trouble leaving getting up off of the bed or couch.  Home Medications Prior to Admission medications   Medication Sig Start Date End Date Taking? Authorizing Provider  benzonatate (TESSALON) 100 MG capsule Take 1 capsule (100 mg total) by mouth every 8 (eight) hours. 04/16/23  Yes Linwood Dibbles, MD  Guaifenesin 1200 MG TB12 Take 1 tablet (1,200 mg total) by mouth 2 (two) times daily at 10 AM and 5 PM. 04/16/23  Yes Linwood Dibbles, MD  ondansetron (ZOFRAN-ODT) 8 MG disintegrating tablet Take 1 tablet (8 mg total) by mouth every 8 (eight) hours as needed for nausea or vomiting. 04/16/23  Yes Linwood Dibbles, MD  acidophilus (RISAQUAD) CAPS capsule Take 1 capsule by mouth daily.    [provider]  amitriptyline (ELAVIL) 10 MG tablet Take 10 mg by mouth at bedtime.    [provider]  b complex vitamins capsule Take 1 capsule by mouth daily.    [provider]  cholecalciferol (VITAMIN D3) 25 MCG (1000 UNIT) tablet Take 1,000 Units by mouth daily.    [provider]  cyclobenzaprine (FLEXERIL) 10 MG tablet Take 1 tablet (10 mg total) by mouth 2 (two) times daily as needed for muscle spasms. 03/03/23    Netta Corrigan, PA-C  dorzolamide-timolol (COSOPT) 22.3-6.8 MG/ML ophthalmic solution Place 1 drop into both eyes 2 (two) times daily. 01/11/20   [provider]  EUTHYROX 50 MCG tablet Take 50 mcg by mouth every morning. 11/25/19   [provider]  lansoprazole (PREVACID) 15 MG capsule Take 15 mg by mouth daily.    [provider]  lidocaine (LIDODERM) 5 % Place 1 patch onto the skin daily. Remove & Discard patch within 12 hours or as directed by MD 03/03/23   Netta Corrigan, PA-C  MAGNESIUM PO Take 1 tablet by mouth daily.    [provider]  metoCLOPramide (REGLAN) 10 MG tablet Take 1 tablet (10 mg total) by mouth every 6 (six) hours. Patient taking differently: Take 10 mg by mouth every 6 (six) hours as needed for vomiting. 04/16/21   Gwyneth Sprout, MD  metoprolol tartrate (LOPRESSOR) 25 MG tablet Take 1 tablet (25 mg total) by mouth every 8 (eight) hours as needed (PALPITATIONS .Marland Kitchen). 03/01/23   Sheilah Pigeon, PA-C  mometasone (ELOCON) 0.1 % lotion as needed for rash. 11/20/20   [provider]  Multiple Vitamin (MULTIVITAMIN WITH MINERALS) TABS tablet Take 1 tablet by mouth daily.    [provider]  PARoxetine (PAXIL) 20 MG tablet Take 20 mg by mouth every morning.    [provider]  POTASSIUM CITRATE PO Take 1 tablet by mouth daily.    [provider]  rosuvastatin (CRESTOR) 10 MG tablet TAKE 1 TABLET BY MOUTH AT BEDTIME 12/30/22   Corky Crafts, MD  vitamin E 180 MG (400 UNITS) capsule Take 400 Units by mouth daily.    [provider]  zinc gluconate 50 MG tablet Take 50 mg by mouth daily.    [provider]  zolpidem (AMBIEN) 5 MG tablet Take 5 mg by mouth at bedtime as needed for sleep.    [provider]      Allergies    Patient has no known allergies.    Review of Systems   Review of Systems  Gastrointestinal:  Positive for vomiting.    Physical Exam Updated Vital  Signs BP (!) 147/95 (BP Location: Left Arm)   Pulse (!) 118   Temp 99.9 F (37.7 C) (Oral)   Resp 18   Ht 1.702 m (5\' 7" )   Wt 84.8 kg   SpO2 93%   BMI 29.29 kg/m  Physical Exam Vitals and nursing note reviewed.  Constitutional:      Appearance: She is well-developed. She is not diaphoretic.  HENT:     Head: Normocephalic and atraumatic.     Right Ear: External ear normal.     Left Ear: External ear normal.  Eyes:     General: No scleral icterus.       Right eye: No discharge.        Left eye: No discharge.     Conjunctiva/sclera: Conjunctivae normal.  Neck:     Trachea: No tracheal deviation.  Cardiovascular:     Rate and Rhythm: Regular rhythm. Tachycardia present.  Pulmonary:     Effort: Pulmonary effort is normal. No respiratory distress.     Breath sounds: Normal breath sounds. No stridor. No wheezing or rales.  Abdominal:     General: Bowel sounds are normal. There is no distension.     Palpations: Abdomen is soft.     Tenderness: There is no abdominal tenderness. There is no guarding or rebound.  Musculoskeletal:        General: No tenderness or deformity.     Cervical back: Neck supple.     Right lower leg: No edema.     Left lower leg: No edema.  Skin:    General: Skin is warm and dry.     Findings: No rash.  Neurological:     General: No focal deficit present.     Mental Status: She is alert.     Cranial Nerves: No cranial nerve deficit, dysarthria or facial asymmetry.     Sensory: No sensory deficit.     Motor: Weakness present. No abnormal muscle tone or seizure activity.     Coordination: Coordination normal.     Comments: Patient able to lift arms and legs off the bed however she is requiring assistance sitting up in the bed  Psychiatric:        Mood and Affect: Mood normal.     ED Results / Procedures / Treatments   Labs (all labs ordered are listed, but only abnormal results are displayed) Labs Reviewed  RESP PANEL BY RT-PCR (RSV, FLU A&B,  COVID)  RVPGX2 - Abnormal; Notable for the following components:      Result Value   Influenza A by PCR POSITIVE (*)    All other components within normal limits  COMPREHENSIVE METABOLIC PANEL - Abnormal; Notable for the following  components:   Glucose, Bld 121 (*)    AST 71 (*)    ALT 69 (*)    All other components within normal limits  CBC - Abnormal; Notable for the following components:   Hemoglobin 15.2 (*)    All other components within normal limits  CULTURE, BLOOD (ROUTINE X 2)  CULTURE, BLOOD (ROUTINE X 2)  LIPASE, BLOOD  CK  I-STAT CG4 LACTIC ACID, ED    EKG None  Radiology DG Chest Portable 1 View Result Date: 04/16/2023 CLINICAL DATA:  Cough. EXAM: PORTABLE CHEST 1 VIEW COMPARISON:  Chest radiograph and CTA chest dated 01/29/2021. FINDINGS: The heart size and mediastinal contours are within normal limits. Loop recorder device overlies the left chest wall. No focal consolidation, pleural effusion, or pneumothorax. No acute osseous abnormality. IMPRESSION: No acute cardiopulmonary findings. Electronically Signed   By: Hart Robinsons M.D.   On: 04/16/2023 14:27    Procedures Procedures    Medications Ordered in ED Medications  ondansetron (ZOFRAN-ODT) disintegrating tablet 4 mg (4 mg Oral Given 04/16/23 0846)  sodium chloride 0.9 % bolus 1,000 mL (1,000 mLs Intravenous New Bag/Given 04/16/23 1410)    ED Course/ Medical Decision Making/ A&P Clinical Course as of 04/16/23 1515  Fri Apr 16, 2023  1232 CBC normal.  Metabolic panel normal.  Lipase normal [JK]  1342 CK CK normal. [JK]  1501 Chest x-ray without pneumonia [JK]    Clinical Course User Index [JK] Linwood Dibbles, MD                                 Medical Decision Making Problems Addressed: Influenza A: acute illness or injury that poses a threat to life or bodily functions  Amount and/or Complexity of Data Reviewed Labs: ordered. Decision-making details documented in ED Course. Radiology: ordered  and independent interpretation performed.  Risk OTC drugs. Prescription drug management.   Patient presented to the ED for evaluation of weakness.  Patient was noted to be mildly tachycardic on arrival.  She has had trouble with cough and congestion recently.  Her laboratory test do not show any signs of pneumonia.  No signs of severe dehydration.  Lipase is normal.  CK without signs to suggest myositis or rhabdomyolysis.  Patient's chest x-ray does not show pneumonia.  Lactic acid level is normal arguing against evolving sepsis.  Patient was positive for influenza.  I suspect this is the cause of her symptoms.  Patient was treated with IV fluids.  While she was in the ED she was able to walk around without difficulty.  Evaluation and diagnostic testing in the emergency department does not suggest an emergent condition requiring admission or immediate intervention beyond what has been performed at this time.  The patient is safe for discharge and has been instructed to return immediately for worsening symptoms, change in symptoms or any other concerns.        Final Clinical Impression(s) / ED Diagnoses Final diagnoses:  Influenza A    Rx / DC Orders ED Discharge Orders          Ordered    benzonatate (TESSALON) 100 MG capsule  Every 8 hours        04/16/23 1513    Guaifenesin 1200 MG TB12  2 times daily        04/16/23 1513    ondansetron (ZOFRAN-ODT) 8 MG disintegrating tablet  Every 8 hours PRN  04/16/23 1513              Linwood Dibbles, MD 04/16/23 1515

## 2023-04-16 NOTE — ED Triage Notes (Signed)
Patient reports cough, nausea, vomiting, x 1 day. Patient denies fevers.

## 2023-04-16 NOTE — ED Notes (Addendum)
Disregard last note 

## 2023-04-17 ENCOUNTER — Encounter (HOSPITAL_BASED_OUTPATIENT_CLINIC_OR_DEPARTMENT_OTHER): Payer: Self-pay | Admitting: Emergency Medicine

## 2023-04-17 ENCOUNTER — Observation Stay (HOSPITAL_BASED_OUTPATIENT_CLINIC_OR_DEPARTMENT_OTHER)
Admission: EM | Admit: 2023-04-17 | Discharge: 2023-04-18 | Disposition: A | Discharge status: Home / Self Care | Payer: PPO | Attending: Family Medicine | Admitting: Family Medicine

## 2023-04-17 ENCOUNTER — Emergency Department (HOSPITAL_BASED_OUTPATIENT_CLINIC_OR_DEPARTMENT_OTHER): Payer: PPO

## 2023-04-17 ENCOUNTER — Other Ambulatory Visit: Payer: Self-pay

## 2023-04-17 DIAGNOSIS — R0902 Hypoxemia: Secondary | ICD-10-CM

## 2023-04-17 DIAGNOSIS — E785 Hyperlipidemia, unspecified: Secondary | ICD-10-CM | POA: Diagnosis not present

## 2023-04-17 DIAGNOSIS — E039 Hypothyroidism, unspecified: Secondary | ICD-10-CM

## 2023-04-17 DIAGNOSIS — F32A Depression, unspecified: Secondary | ICD-10-CM | POA: Diagnosis present

## 2023-04-17 DIAGNOSIS — R531 Weakness: Secondary | ICD-10-CM | POA: Insufficient documentation

## 2023-04-17 DIAGNOSIS — R7989 Other specified abnormal findings of blood chemistry: Secondary | ICD-10-CM | POA: Diagnosis present

## 2023-04-17 DIAGNOSIS — J101 Influenza due to other identified influenza virus with other respiratory manifestations: Secondary | ICD-10-CM | POA: Diagnosis not present

## 2023-04-17 DIAGNOSIS — J111 Influenza due to unidentified influenza virus with other respiratory manifestations: Principal | ICD-10-CM

## 2023-04-17 DIAGNOSIS — R059 Cough, unspecified: Secondary | ICD-10-CM | POA: Diagnosis present

## 2023-04-17 LAB — CBC WITH DIFFERENTIAL/PLATELET
Abs Immature Granulocytes: 0.03 10*3/uL (ref 0.00–0.07)
Basophils Absolute: 0 10*3/uL (ref 0.0–0.1)
Basophils Relative: 1 %
Eosinophils Absolute: 0 10*3/uL (ref 0.0–0.5)
Eosinophils Relative: 0 %
HCT: 42.3 % (ref 36.0–46.0)
Hemoglobin: 14.2 g/dL (ref 12.0–15.0)
Immature Granulocytes: 1 %
Lymphocytes Relative: 18 %
Lymphs Abs: 1 10*3/uL (ref 0.7–4.0)
MCH: 29.5 pg (ref 26.0–34.0)
MCHC: 33.6 g/dL (ref 30.0–36.0)
MCV: 87.8 fL (ref 80.0–100.0)
Monocytes Absolute: 0.6 10*3/uL (ref 0.1–1.0)
Monocytes Relative: 11 %
Neutro Abs: 3.8 10*3/uL (ref 1.7–7.7)
Neutrophils Relative %: 69 %
Platelets: 244 10*3/uL (ref 150–400)
RBC: 4.82 MIL/uL (ref 3.87–5.11)
RDW: 14 % (ref 11.5–15.5)
WBC: 5.4 10*3/uL (ref 4.0–10.5)
nRBC: 0 % (ref 0.0–0.2)

## 2023-04-17 LAB — COMPREHENSIVE METABOLIC PANEL
ALT: 124 U/L — ABNORMAL HIGH (ref 0–44)
AST: 182 U/L — ABNORMAL HIGH (ref 15–41)
Albumin: 3.8 g/dL (ref 3.5–5.0)
Alkaline Phosphatase: 46 U/L (ref 38–126)
Anion gap: 10 (ref 5–15)
BUN: 9 mg/dL (ref 8–23)
CO2: 23 mmol/L (ref 22–32)
Calcium: 8.4 mg/dL — ABNORMAL LOW (ref 8.9–10.3)
Chloride: 102 mmol/L (ref 98–111)
Creatinine, Ser: 0.78 mg/dL (ref 0.44–1.00)
GFR, Estimated: >60 mL/min (ref >60)
Glucose, Bld: 113 mg/dL — ABNORMAL HIGH (ref 70–99)
Potassium: 3.9 mmol/L (ref 3.5–5.1)
Sodium: 135 mmol/L (ref 135–145)
Total Bilirubin: 0.6 mg/dL (ref 0.0–1.2)
Total Protein: 6.7 g/dL (ref 6.5–8.1)

## 2023-04-17 LAB — LIPASE, BLOOD: Lipase: 24 U/L (ref 11–51)

## 2023-04-17 MED ORDER — ALBUTEROL SULFATE (2.5 MG/3ML) 0.083% IN NEBU: 2.5000 mg | INHALATION_SOLUTION | Freq: Four times a day (QID) | RESPIRATORY_TRACT | Status: DC | PRN

## 2023-04-17 MED ORDER — IBUPROFEN 200 MG PO TABS
400.0000 mg | ORAL_TABLET | Freq: Four times a day (QID) | ORAL | Status: DC | PRN
  Filled 2023-04-17 (×2): qty 2

## 2023-04-17 MED ORDER — ENOXAPARIN SODIUM 40 MG/0.4ML IJ SOSY
40.0000 mg | PREFILLED_SYRINGE | INTRAMUSCULAR | Status: DC
  Administered 2023-04-17: 40 mg via SUBCUTANEOUS
  Filled 2023-04-17: qty 0.4

## 2023-04-17 MED ORDER — ONDANSETRON HCL 4 MG/2ML IJ SOLN
4.0000 mg | Freq: Once | INTRAMUSCULAR | Status: AC
  Administered 2023-04-17: 4 mg via INTRAVENOUS
  Filled 2023-04-17: qty 2

## 2023-04-17 MED ORDER — OSELTAMIVIR PHOSPHATE 75 MG PO CAPS
75.0000 mg | ORAL_CAPSULE | Freq: Once | ORAL | Status: AC
  Administered 2023-04-17: 75 mg via ORAL
  Filled 2023-04-17: qty 1

## 2023-04-17 MED ORDER — PAROXETINE HCL 20 MG PO TABS
20.0000 mg | ORAL_TABLET | Freq: Every day | ORAL | Status: DC
  Administered 2023-04-18: 20 mg via ORAL
  Filled 2023-04-17: qty 1

## 2023-04-17 MED ORDER — SODIUM CHLORIDE 0.9 % IV BOLUS
1000.0000 mL | Freq: Once | INTRAVENOUS | Status: AC
  Administered 2023-04-17: 1000 mL via INTRAVENOUS

## 2023-04-17 MED ORDER — IPRATROPIUM-ALBUTEROL 0.5-2.5 (3) MG/3ML IN SOLN
3.0000 mL | Freq: Once | RESPIRATORY_TRACT | Status: AC
  Administered 2023-04-17: 3 mL via RESPIRATORY_TRACT
  Filled 2023-04-17: qty 3

## 2023-04-17 MED ORDER — SENNOSIDES-DOCUSATE SODIUM 8.6-50 MG PO TABS: 1.0000 | ORAL_TABLET | Freq: Every evening | ORAL | Status: DC | PRN

## 2023-04-17 MED ORDER — DIPHENHYDRAMINE HCL 50 MG/ML IJ SOLN
12.5000 mg | Freq: Once | INTRAMUSCULAR | Status: AC
  Administered 2023-04-17: 12.5 mg via INTRAVENOUS
  Filled 2023-04-17: qty 1

## 2023-04-17 MED ORDER — DORZOLAMIDE HCL-TIMOLOL MAL 2-0.5 % OP SOLN
1.0000 [drp] | Freq: Two times a day (BID) | OPHTHALMIC | Status: DC
  Administered 2023-04-18: 1 [drp] via OPHTHALMIC
  Filled 2023-04-17: qty 10

## 2023-04-17 MED ORDER — PANTOPRAZOLE SODIUM 20 MG PO TBEC
20.0000 mg | DELAYED_RELEASE_TABLET | Freq: Every day | ORAL | Status: DC
Start: 2023-04-18 — End: 2023-04-18
  Administered 2023-04-18: 20 mg via ORAL
  Filled 2023-04-17: qty 1

## 2023-04-17 MED ORDER — LEVOTHYROXINE SODIUM 50 MCG PO TABS
50.0000 ug | ORAL_TABLET | Freq: Every morning | ORAL | Status: DC
  Administered 2023-04-18: 50 ug via ORAL
  Filled 2023-04-17: qty 1

## 2023-04-17 MED ORDER — ACETAMINOPHEN 325 MG PO TABS
650.0000 mg | ORAL_TABLET | Freq: Once | ORAL | Status: AC
Start: 2023-04-17 — End: 2023-04-17
  Administered 2023-04-17: 650 mg via ORAL
  Filled 2023-04-17: qty 2

## 2023-04-17 MED ORDER — GUAIFENESIN ER 600 MG PO TB12
600.0000 mg | ORAL_TABLET | Freq: Two times a day (BID) | ORAL | Status: DC
  Administered 2023-04-17 – 2023-04-18 (×2): 600 mg via ORAL
  Filled 2023-04-17 (×2): qty 1

## 2023-04-17 MED ORDER — AMITRIPTYLINE HCL 10 MG PO TABS
10.0000 mg | ORAL_TABLET | Freq: Every day | ORAL | Status: DC
  Administered 2023-04-17: 10 mg via ORAL
  Filled 2023-04-17: qty 1

## 2023-04-17 MED ORDER — PROCHLORPERAZINE EDISYLATE 10 MG/2ML IJ SOLN
5.0000 mg | Freq: Once | INTRAMUSCULAR | Status: AC
  Administered 2023-04-17: 5 mg via INTRAVENOUS
  Filled 2023-04-17: qty 2

## 2023-04-17 MED ORDER — ONDANSETRON HCL 4 MG/2ML IJ SOLN: 4.0000 mg | Freq: Four times a day (QID) | INTRAMUSCULAR | Status: DC | PRN

## 2023-04-17 MED ORDER — METOPROLOL TARTRATE 25 MG PO TABS: 25.0000 mg | ORAL_TABLET | Freq: Three times a day (TID) | ORAL | Status: DC | PRN

## 2023-04-17 MED ORDER — OSELTAMIVIR PHOSPHATE 75 MG PO CAPS
75.0000 mg | ORAL_CAPSULE | Freq: Two times a day (BID) | ORAL | Status: DC
  Administered 2023-04-17 – 2023-04-18 (×2): 75 mg via ORAL
  Filled 2023-04-17 (×2): qty 1

## 2023-04-17 NOTE — ED Notes (Signed)
 Called [facility] for transport.

## 2023-04-17 NOTE — Hospital Course (Signed)
Amy Glenn is a 73 y.o. female with medical history significant for SVT, hypothyroidism, HLD, depression who is admitted with influenza A.

## 2023-04-17 NOTE — ED Notes (Signed)
Placed pt on 2L , sats 89% with good pleth, multiple fingers checked on pulse ox. In Radiology

## 2023-04-17 NOTE — ED Notes (Signed)
Report called to [facility], RN.

## 2023-04-17 NOTE — ED Triage Notes (Signed)
Patient BIB [facility] from home. Seen at [facility] for same on 1/31. Dx with flu at that visit. Sx ongoing X 1 week. C/o n/v, dry cough and weakness. VSS.

## 2023-04-17 NOTE — H&P (Signed)
History and Physical    Amy Glenn KGM:[id number] DOB: 1950/08/07 DOA: 04/17/2023  PCP: Farris Has, MD  Patient coming from: Home  I have personally briefly reviewed patient's old medical records in [facility]  Chief Complaint: Cough, weakness  HPI: Amy Glenn is a 73 y.o. female with medical history significant for SVT, hypothyroidism, HLD, depression who presented to the ED for evaluation of cough, generalized weakness.  Patient was initially seen in the ED yesterday 04/16/2023 for cough and weakness.  She tested positive for influenza A.  She was given 1 L normal saline and ambulated in the ED without difficulty.  She was felt stable to discharge to home and was given strict return precautions.  Patient states today she felt very weak and has fallen a couple times but did not have any injuries or lose consciousness.  She felt that her muscles were weak but she did not have any muscle pain or bodyaches.  She has had a cough for several days, initially some clear sputum production but not dry.  She denies any fevers, chills, diaphoresis.  She had some mild shortness of breath earlier but not currently.  [facility] ED Course  Labs/Imaging on admission: I have personally reviewed following labs and imaging studies.  Initial vitals showed BP 143/87, pulse 118, RR 16, temp 99.8 F, SpO2 89% on room air.  SpO2 improved to 94% on 2 L O2 via [place].  Labs show WBC 5.4, hemoglobin 14.2, platelets 244,000, sodium 135, potassium 3.9, bicarb 23, BUN 9, creatinine 0.78, serum glucose 113, AST 182, ALT 124, alk phos 46, total bilirubin 0.6.  2 view chest x-ray showed minimal streaky atelectasis in the lung bases, left greater than right.  Patient was given 1 L normal saline, DuoNeb treatment, Tamiflu.  The hospitalist service was consulted to admit for further evaluation and management.  Review of Systems: All systems reviewed and are negative except as documented in  history of present illness above.   Past Medical History:  Diagnosis Date   Arthritis    Chronic bronchitis (HCC)    Depression    GERD (gastroesophageal reflux disease)    Hypothyroidism    Wears hearing aid    both ears    Past Surgical History:  Procedure Laterality Date   APPENDECTOMY     CHOLECYSTECTOMY  03/16/1980   DILATION AND CURETTAGE OF UTERUS  03/16/2001   fibroids   ECTOPIC PREGNANCY SURGERY  1979x2   on rt   ECTOPIC PREGNANCY SURGERY  03/16/1978   x2 left   EXPLORATORY LAPAROTOMY  10/15/1975   for fertility   HERNIA REPAIR  03/16/1993   lt LA   KNEE ARTHROSCOPY Left 07/12/2012   Procedure: LEFT KNEE ARTHROSCOPY ;  Surgeon: Velna Ochs, MD;  Location: [facility];  Service: Orthopedics;  Laterality: Left;  Left knee arthroscopy with partial medial menisectomy and chondroplasty   LAPAROSCOPIC ABDOMINAL EXPLORATION  03/16/1976   repair fall tube scarring fertility   REPLACEMENT UNICONDYLAR JOINT KNEE Right 2010   REPLACEMENT UNICONDYLAR JOINT KNEE Left 2008   SHOULDER ARTHROSCOPY  03/17/2003   left   XI ROBOTIC ASSISTED LOWER ANTERIOR RESECTION N/A 05/30/2021   Procedure: XI ROBOTIC ASSISTED LOWER ANTERIOR RESECTION OF COLON RECTOSIGMOID, INTRAOPERATIVE ASSESSMENT OF PERFUSION USING FIREFLY, TAP BLOCK, AND RIGID PROCTOSCOPY;  Surgeon: Karie Soda, MD;  Location: [facility];  Service: General;  Laterality: N/A;    Social History:  reports that she has never smoked. She  has never used smokeless tobacco. She reports current alcohol use. She reports that she does not use drugs.  No Known Allergies  Family History  Problem Relation Age of Onset   Hypertension Sister      Prior to Admission medications   Medication Sig Start Date End Date Taking? Authorizing Provider  acidophilus (RISAQUAD) CAPS capsule Take 1 capsule by mouth daily.    [provider]  amitriptyline (ELAVIL) 10 MG tablet Take 10 mg by mouth at bedtime.    [provider]  b complex vitamins capsule Take 1 capsule by mouth daily.    [provider]  benzonatate (TESSALON) 100 MG capsule Take 1 capsule (100 mg total) by mouth every 8 (eight) hours. 04/16/23   Linwood Dibbles, MD  cholecalciferol (VITAMIN D3) 25 MCG (1000 UNIT) tablet Take 1,000 Units by mouth daily.    [provider]  cyclobenzaprine (FLEXERIL) 10 MG tablet Take 1 tablet (10 mg total) by mouth 2 (two) times daily as needed for muscle spasms. 03/03/23   Netta Corrigan, PA-C  dorzolamide-timolol (COSOPT) 22.3-6.8 MG/ML ophthalmic solution Place 1 drop into both eyes 2 (two) times daily. 01/11/20   [provider]  EUTHYROX 50 MCG tablet Take 50 mcg by mouth every morning. 11/25/19   [provider]  Guaifenesin 1200 MG TB12 Take 1 tablet (1,200 mg total) by mouth 2 (two) times daily at 10 AM and 5 PM. 04/16/23   Linwood Dibbles, MD  lansoprazole (PREVACID) 15 MG capsule Take 15 mg by mouth daily.    [provider]  lidocaine (LIDODERM) 5 % Place 1 patch onto the skin daily. Remove & Discard patch within 12 hours or as directed by MD 03/03/23   Netta Corrigan, PA-C  MAGNESIUM PO Take 1 tablet by mouth daily.    [provider]  metoCLOPramide (REGLAN) 10 MG tablet Take 1 tablet (10 mg total) by mouth every 6 (six) hours. Patient taking differently: Take 10 mg by mouth every 6 (six) hours as needed for vomiting. 04/16/21   Gwyneth Sprout, MD  metoprolol tartrate (LOPRESSOR) 25 MG tablet Take 1 tablet (25 mg total) by mouth every 8 (eight) hours as needed (PALPITATIONS .Marland Kitchen). 03/01/23   Sheilah Pigeon, PA-C  mometasone (ELOCON) 0.1 % lotion as needed for rash. 11/20/20   [provider]  Multiple Vitamin (MULTIVITAMIN WITH MINERALS) TABS tablet Take 1 tablet by mouth daily.    [provider]  ondansetron (ZOFRAN-ODT) 8 MG disintegrating tablet Take 1 tablet (8 mg total) by mouth every 8 (eight) hours as needed for nausea or  vomiting. 04/16/23   Linwood Dibbles, MD  PARoxetine (PAXIL) 20 MG tablet Take 20 mg by mouth every morning.    [provider]  POTASSIUM CITRATE PO Take 1 tablet by mouth daily.    [provider]  rosuvastatin (CRESTOR) 10 MG tablet TAKE 1 TABLET BY MOUTH AT BEDTIME 12/30/22   Corky Crafts, MD  vitamin E 180 MG (400 UNITS) capsule Take 400 Units by mouth daily.    [provider]  zinc gluconate 50 MG tablet Take 50 mg by mouth daily.    [provider]  zolpidem (AMBIEN) 5 MG tablet Take 5 mg by mouth at bedtime as needed for sleep.    [provider]    Physical Exam: Vitals:   04/17/23 1830 04/17/23 1900 04/17/23 1928 04/17/23 2040  BP: (!) 119/95 116/73  127/86  Pulse: 91 (!) 102  91  Resp: 18   18  Temp:   98.7 F (37.1 C) 98.8 F (37.1 C)  TempSrc:   Oral Oral  SpO2: 100% 97%  99%   Constitutional: Resting in bed, NAD, calm, comfortable Eyes: EOMI, lids and conjunctivae normal ENMT: Mucous membranes are moist. Posterior pharynx clear of any exudate or lesions.Normal dentition.  Neck: normal, supple, no masses. Respiratory: clear to auscultation bilaterally, no wheezing, no crackles. Normal respiratory effort. No accessory muscle use.  Cardiovascular: Regular rate and rhythm, no murmurs / rubs / gallops. No extremity edema. 2+ pedal pulses. Abdomen: no tenderness, no masses palpated.  Musculoskeletal: no clubbing / cyanosis. No joint deformity upper and lower extremities. Good ROM, no contractures. Normal muscle tone.  Skin: no rashes, lesions, ulcers. No induration Neurologic: Sensation intact. Strength 5/5 in all 4.  Psychiatric: Normal judgment and insight. Alert and oriented x 3. Normal mood.   EKG: Not performed. Assessment/Plan Principal Problem:   Influenza A Active Problems:   Depression   Elevated LFTs   Hypothyroidism   Amy Glenn is a 73 y.o. female with medical history significant for SVT,  hypothyroidism, HLD, depression who is admitted with influenza A.  Assessment and Plan: Influenza A: Patient positive for influenza A with generalized weakness.  Mild hypoxia of 89% on ED arrival, stable on 2 L O2 via [place].  CXR shows minimal bibasilar atelectasis but no evidence of pneumonia.  Lungs are clear on auscultation. -Continue Tamiflu -Wean supplemental oxygen as able -IS, FV, Mucinex -PT eval  Elevated AST/ALT: Appears to have some chronic mild elevation although increased  today compared to yesterday with AST 182 and ALT 124.  Suspect increased in setting of influenza A infection.  Will repeat labs in AM.  History of SVT: Continue Lopressor 25 mg every 8 hours as needed.  Hypothyroidism: Continue Synthroid.  Hyperlipidemia: Holding statin for now given elevated AST/ALT.  Depression: Continue amitriptyline and Paxil.   DVT prophylaxis: enoxaparin (LOVENOX) injection 40 mg Start: 04/17/23 2200 Code Status: Full code, confirmed with patient on admission Family Communication: Discussed with patient, she has discussed with family Disposition Plan: From home and likely discharge to home pending clinical progress Consults called: None Severity of Illness: The appropriate patient status for this patient is OBSERVATION. Observation status is judged to be reasonable and necessary in order to provide the required intensity of service to ensure the patient's safety. The patient's presenting symptoms, physical exam findings, and initial radiographic and laboratory data in the context of their medical condition is felt to place them at decreased risk for further clinical deterioration. Furthermore, it is anticipated that the patient will be medically stable for discharge from the hospital within 2 midnights of admission.   Darreld Mclean MD [facility]  If 7PM-7AM, please contact night-coverage [email or web address]  04/17/2023, 9:25 PM

## 2023-04-17 NOTE — ED Provider Notes (Signed)
[place] [facility] Provider Note   CSN: [id number] Arrival date & time: 04/17/23  1234     History  Chief Complaint  Patient presents with   Influenza    Amy Glenn is a 73 y.o. female.  73 year old female with past medical history of hypertension hyperlipidemia presenting to the emergency department today with generalized weakness as well as shortness of breath and cough.  The patient has had some intermittent vomiting as well.  She was seen here few days ago and was diagnosed with the flu.  She states that she has become generally weak since she has gotten home.  She states that she has been having some shortness of breath and minimally productive cough with that.  She came to the emergency department today for further evaluation due to generalized weakness.  She does live at home alone.  States that she has fallen a couple times but has not had any injuries.  She has not hit her head or lost consciousness during any of these falls but states is due to generalized weakness.   Influenza Presenting symptoms: cough, fatigue, fever and shortness of breath   Associated symptoms: chills        Home Medications Prior to Admission medications   Medication Sig Start Date End Date Taking? Authorizing Provider  acidophilus (RISAQUAD) CAPS capsule Take 1 capsule by mouth daily.    [provider]  amitriptyline (ELAVIL) 10 MG tablet Take 10 mg by mouth at bedtime.    [provider]  b complex vitamins capsule Take 1 capsule by mouth daily.    [provider]  benzonatate (TESSALON) 100 MG capsule Take 1 capsule (100 mg total) by mouth every 8 (eight) hours. 04/16/23   Linwood Dibbles, MD  cholecalciferol (VITAMIN D3) 25 MCG (1000 UNIT) tablet Take 1,000 Units by mouth daily.    [provider]  cyclobenzaprine (FLEXERIL) 10 MG tablet Take 1 tablet (10 mg total) by mouth 2 (two) times daily as needed for muscle  spasms. 03/03/23   Netta Corrigan, PA-C  dorzolamide-timolol (COSOPT) 22.3-6.8 MG/ML ophthalmic solution Place 1 drop into both eyes 2 (two) times daily. 01/11/20   [provider]  EUTHYROX 50 MCG tablet Take 50 mcg by mouth every morning. 11/25/19   [provider]  Guaifenesin 1200 MG TB12 Take 1 tablet (1,200 mg total) by mouth 2 (two) times daily at 10 AM and 5 PM. 04/16/23   Linwood Dibbles, MD  lansoprazole (PREVACID) 15 MG capsule Take 15 mg by mouth daily.    [provider]  lidocaine (LIDODERM) 5 % Place 1 patch onto the skin daily. Remove & Discard patch within 12 hours or as directed by MD 03/03/23   Netta Corrigan, PA-C  MAGNESIUM PO Take 1 tablet by mouth daily.    [provider]  metoCLOPramide (REGLAN) 10 MG tablet Take 1 tablet (10 mg total) by mouth every 6 (six) hours. Patient taking differently: Take 10 mg by mouth every 6 (six) hours as needed for vomiting. 04/16/21   Gwyneth Sprout, MD  metoprolol tartrate (LOPRESSOR) 25 MG tablet Take 1 tablet (25 mg total) by mouth every 8 (eight) hours as needed (PALPITATIONS .Marland Kitchen). 03/01/23   Sheilah Pigeon, PA-C  mometasone (ELOCON) 0.1 % lotion as needed for rash. 11/20/20   [provider]  Multiple Vitamin (MULTIVITAMIN WITH MINERALS) TABS tablet Take 1 tablet by mouth daily.    [provider]  ondansetron (ZOFRAN-ODT) 8 MG disintegrating tablet Take 1 tablet (8 mg total) by mouth every 8 (eight) hours as needed for nausea or vomiting. 04/16/23   Linwood Dibbles, MD  PARoxetine (PAXIL) 20 MG tablet Take 20 mg by mouth every morning.    [provider]  POTASSIUM CITRATE PO Take 1 tablet by mouth daily.    [provider]  rosuvastatin (CRESTOR) 10 MG tablet TAKE 1 TABLET BY MOUTH AT BEDTIME 12/30/22   Corky Crafts, MD  vitamin E 180 MG (400 UNITS) capsule Take 400 Units by mouth daily.    [provider]  zinc gluconate 50 MG tablet Take 50 mg by mouth  daily.    [provider]  zolpidem (AMBIEN) 5 MG tablet Take 5 mg by mouth at bedtime as needed for sleep.    [provider]      Allergies    Patient has no known allergies.    Review of Systems   Review of Systems  Constitutional:  Positive for chills, fatigue and fever.  Respiratory:  Positive for cough and shortness of breath.   All other systems reviewed and are negative.   Physical Exam Updated Vital Signs BP 125/81   Pulse (!) 111   Temp 99.8 F (37.7 C)   Resp 14   SpO2 92%  Physical Exam Vitals and nursing note reviewed.   Gen: Ill-appearing, nontoxic Eyes: PERRL, EOMI HEENT: no oropharyngeal swelling, dry mucous membranes Neck: trachea midline Resp: clear to auscultation bilaterally Card: Tachycardic, no murmurs, rubs, or gallops Abd: nontender, nondistended Extremities: no calf tenderness, no edema Vascular: 2+ radial pulses bilaterally, 2+ DP pulses bilaterally Skin: no rashes Psyc: acting appropriately   ED Results / Procedures / Treatments   Labs (all labs ordered are listed, but only abnormal results are displayed) Labs Reviewed  COMPREHENSIVE METABOLIC PANEL - Abnormal; Notable for the following components:      Result Value   Glucose, Bld 113 (*)    Calcium 8.4 (*)    AST 182 (*)    ALT 124 (*)    All other components within normal limits  LIPASE, BLOOD  CBC WITH DIFFERENTIAL/PLATELET    EKG None  Radiology DG Chest 2 View Result Date: 04/17/2023 CLINICAL DATA:  Cough for 1 week, flu, nausea, vomiting and headache. EXAM: CHEST - 2 VIEW COMPARISON:  Lymph 04/15/2023 and CT chest 01/29/2021. FINDINGS: Patient is slightly rotated. Trachea is midline. Heart size stable. Loop recorder in place. Mild streaky opacification in the lung bases, left greater than right, likely due to scarring. No airspace consolidation or pleural fluid. Mild degenerative changes in the spine. IMPRESSION: Minimal streaky atelectasis in the lung bases,  left greater than right. Electronically Signed   By: Leanna Battles M.D.   On: 04/17/2023 13:30   DG Chest Portable 1 View Result Date: 04/16/2023 CLINICAL DATA:  Cough. EXAM: PORTABLE CHEST 1 VIEW COMPARISON:  Chest radiograph and CTA chest dated 01/29/2021. FINDINGS: The heart size and mediastinal contours are within normal limits. Loop recorder device overlies the left chest wall. No focal consolidation, pleural effusion, or pneumothorax. No acute osseous abnormality. IMPRESSION: No acute cardiopulmonary findings. Electronically Signed   By: Hart Robinsons M.D.   On: 04/16/2023 14:27    Procedures Procedures    Medications Ordered in ED Medications  oseltamivir (TAMIFLU) capsule 75 mg (75 mg Oral Given 04/17/23 1406)  sodium chloride 0.9 % bolus 1,000 mL (1,000 mLs Intravenous New Bag/Given 04/17/23 1353)  ondansetron (  ZOFRAN) injection 4 mg (4 mg Intravenous Given 04/17/23 1407)  ipratropium-albuterol (DUONEB) 0.5-2.5 (3) MG/3ML nebulizer solution 3 mL (3 mLs Nebulization Given 04/17/23 1407)  acetaminophen (TYLENOL) tablet 650 mg (650 mg Oral Given 04/17/23 1406)    ED Course/ Medical Decision Making/ A&P                                 Medical Decision Making 73 year old female with past medical history of hypertension and hyperlipidemia presenting to the emergency department today with generalized weakness as well as cough and shortness of breath.  Patient is flu positive.  Give patient Tamiflu here.  Will obtain labs here and give patient IV fluids.  She is requiring some oxygen here.  I will give her a nebulizer treatment see if this helps.  She will likely require admission.  Patient's labs here are reassuring.  She remained stable on 2 L nasal cannula although her pulse ox is in the low 90s.  A call was placed to hospital service for admission.  Amount and/or Complexity of Data Reviewed Labs: ordered. Radiology: ordered.  Risk OTC drugs. Prescription drug management. Decision  regarding hospitalization.           Final Clinical Impression(s) / ED Diagnoses Final diagnoses:  Influenza  Hypoxia  Generalized weakness    Rx / DC Orders ED Discharge Orders     None         Durwin Glaze, MD 04/17/23 1435

## 2023-04-18 DIAGNOSIS — J101 Influenza due to other identified influenza virus with other respiratory manifestations: Secondary | ICD-10-CM | POA: Diagnosis not present

## 2023-04-18 LAB — COMPREHENSIVE METABOLIC PANEL
ALT: 102 U/L — ABNORMAL HIGH (ref 0–44)
AST: 125 U/L — ABNORMAL HIGH (ref 15–41)
Albumin: 3.4 g/dL — ABNORMAL LOW (ref 3.5–5.0)
Alkaline Phosphatase: 42 U/L (ref 38–126)
Anion gap: 9 (ref 5–15)
BUN: 11 mg/dL (ref 8–23)
CO2: 24 mmol/L (ref 22–32)
Calcium: 8.3 mg/dL — ABNORMAL LOW (ref 8.9–10.3)
Chloride: 107 mmol/L (ref 98–111)
Creatinine, Ser: 0.63 mg/dL (ref 0.44–1.00)
GFR, Estimated: >60 mL/min (ref >60)
Glucose, Bld: 92 mg/dL (ref 70–99)
Potassium: 3.4 mmol/L — ABNORMAL LOW (ref 3.5–5.1)
Sodium: 140 mmol/L (ref 135–145)
Total Bilirubin: 0.6 mg/dL (ref 0.0–1.2)
Total Protein: 5.9 g/dL — ABNORMAL LOW (ref 6.5–8.1)

## 2023-04-18 LAB — CBC
HCT: 43.7 % (ref 36.0–46.0)
Hemoglobin: 14 g/dL (ref 12.0–15.0)
MCH: 29.5 pg (ref 26.0–34.0)
MCHC: 32 g/dL (ref 30.0–36.0)
MCV: 92 fL (ref 80.0–100.0)
Platelets: 207 10*3/uL (ref 150–400)
RBC: 4.75 MIL/uL (ref 3.87–5.11)
RDW: 14 % (ref 11.5–15.5)
WBC: 4.3 10*3/uL (ref 4.0–10.5)
nRBC: 0 % (ref 0.0–0.2)

## 2023-04-18 MED ORDER — ORAL CARE MOUTH RINSE: 15.0000 mL | OROMUCOSAL | Status: DC | PRN

## 2023-04-18 MED ORDER — OSELTAMIVIR PHOSPHATE 75 MG PO CAPS: 75.0000 mg | ORAL_CAPSULE | Freq: Two times a day (BID) | ORAL | 0 refills | Status: AC

## 2023-04-18 MED ORDER — PNEUMOCOCCAL 20-VAL CONJ VACC 0.5 ML IM SUSY: 0.5000 mL | PREFILLED_SYRINGE | INTRAMUSCULAR | Status: DC

## 2023-04-18 MED ORDER — IBUPROFEN 200 MG PO TABS: 600.0000 mg | ORAL_TABLET | Freq: Four times a day (QID) | ORAL | Status: DC | PRN

## 2023-04-18 NOTE — Discharge Summary (Signed)
Physician Discharge Summary   Patient: Amy Glenn MRN: [id number] DOB: 09-14-1950  Admit date:     04/17/2023  Discharge date: 04/18/23  Discharge Physician: Tyrone Nine   PCP: Farris Has, MD   Recommendations at discharge:  Follow up with PCP in 1-2 weeks. Suggest recheck CMP.  Discharge Diagnoses: Principal Problem:   Influenza A Active Problems:   Depression   Elevated LFTs   Hypothyroidism  Hospital Course: Amy Glenn is a 73 y.o. female with medical history significant for SVT, hypothyroidism, HLD, depression who is admitted with influenza A. She had borderline oxygen saturations in the ED though has a steady gait and no exertional hypoxemia on day of discharge.   Assessment and Plan: Influenza A: Patient positive for influenza A with generalized weakness.  Mild hypoxia of 89% on ED arrival, stable on 2 L O2 via  which has been weaned. CXR shows minimal bibasilar atelectasis but no evidence of pneumonia.  Lungs are clear on auscultation. -Continue Tamiflu  - Continue IS/FV and typical isolation protocols at home.   Elevated AST/ALT: Improving, suspected to be due to viral infection.  - Suggest recheck at follow up.    History of SVT: None noted during observation period.  Continue Lopressor 25 mg every 8 hours as needed.   Hypothyroidism: Continue Synthroid.   Hyperlipidemia: Continue statin   Depression: Continue amitriptyline and Paxil.  Weakness: This has resolved. She has steady gait and no exertional hypoxemia at discharge.   Consultants: None Procedures performed: None  Disposition: Home Diet recommendation: Cardiac DISCHARGE MEDICATION: Allergies as of 04/18/2023   No Known Allergies      Medication List     STOP taking these medications    cyclobenzaprine 10 MG tablet Commonly known as: FLEXERIL   ondansetron 8 MG disintegrating tablet Commonly known as: ZOFRAN-ODT   zolpidem 5 MG tablet Commonly known as: AMBIEN        TAKE these medications    acidophilus Caps capsule Take 1 capsule by mouth daily.   amitriptyline 10 MG tablet Commonly known as: ELAVIL Take 10 mg by mouth at bedtime.   b complex vitamins capsule Take 1 capsule by mouth daily.   benzonatate 100 MG capsule Commonly known as: TESSALON Take 1 capsule (100 mg total) by mouth every 8 (eight) hours.   cholecalciferol 25 MCG (1000 UNIT) tablet Commonly known as: VITAMIN D3 Take 1,000 Units by mouth daily.   dorzolamide-timolol 2-0.5 % ophthalmic solution Commonly known as: COSOPT Place 1 drop into both eyes 2 (two) times daily.   Euthyrox 50 MCG tablet Generic drug: levothyroxine Take 50 mcg by mouth every morning.   Guaifenesin 1200 MG Tb12 Take 1 tablet (1,200 mg total) by mouth 2 (two) times daily at 10 AM and 5 PM.   ibuprofen 200 MG tablet Commonly known as: ADVIL Take 3 tablets (600 mg total) by mouth every 6 (six) hours as needed (pain). What changed:  how much to take reasons to take this   lansoprazole 15 MG capsule Commonly known as: PREVACID Take 15 mg by mouth daily.   lidocaine 5 % Commonly known as: Lidoderm Place 1 patch onto the skin daily. Remove & Discard patch within 12 hours or as directed by MD   MAGNESIUM PO Take 1 tablet by mouth daily.   metoprolol tartrate 25 MG tablet Commonly known as: LOPRESSOR Take 1 tablet (25 mg total) by mouth every 8 (eight) hours as needed (PALPITATIONS .Marland Kitchen).   multivitamin with  minerals Tabs tablet Take 1 tablet by mouth daily.   oseltamivir 75 MG capsule Commonly known as: TAMIFLU Take 1 capsule (75 mg total) by mouth 2 (two) times daily for 4 days.   PARoxetine 20 MG tablet Commonly known as: PAXIL Take 20 mg by mouth every morning.   POTASSIUM CITRATE PO Take 1 tablet by mouth daily.   rosuvastatin 10 MG tablet Commonly known as: CRESTOR TAKE 1 TABLET BY MOUTH AT BEDTIME   vitamin E 180 MG (400 UNITS) capsule Take 400 Units by mouth daily.    zinc gluconate 50 MG tablet Take 50 mg by mouth daily.        Follow-up Information     Farris Has, MD Follow up.   Specialty: Family Medicine Contact information: [address] [place] [address] [phone]                Discharge Exam: Ceasar Mons Weights   04/18/23 0046  Weight: 84.8 kg  Gen: No distress Pulm: Clear, nonlabored  CV: RRR, no MRG or edema GI: Soft, NT, ND, +BS  Neuro: Alert and oriented. HOH improved with hearing aids. No new focal deficits. Ext: Warm, no deformities Skin: No rashes, lesions or ulcers on visualized skin   Condition at discharge: stable  The results of significant diagnostics from this hospitalization (including imaging, microbiology, ancillary and laboratory) are listed below for reference.   Imaging Studies: DG Chest 2 View Result Date: 04/17/2023 CLINICAL DATA:  Cough for 1 week, flu, nausea, vomiting and headache. EXAM: CHEST - 2 VIEW COMPARISON:  Lymph 04/15/2023 and CT chest 01/29/2021. FINDINGS: Patient is slightly rotated. Trachea is midline. Heart size stable. Loop recorder in place. Mild streaky opacification in the lung bases, left greater than right, likely due to scarring. No airspace consolidation or pleural fluid. Mild degenerative changes in the spine. IMPRESSION: Minimal streaky atelectasis in the lung bases, left greater than right. Electronically Signed   By: Leanna Battles M.D.   On: 04/17/2023 13:30   DG Chest Portable 1 View Result Date: 04/16/2023 CLINICAL DATA:  Cough. EXAM: PORTABLE CHEST 1 VIEW COMPARISON:  Chest radiograph and CTA chest dated 01/29/2021. FINDINGS: The heart size and mediastinal contours are within normal limits. Loop recorder device overlies the left chest wall. No focal consolidation, pleural effusion, or pneumothorax. No acute osseous abnormality. IMPRESSION: No acute cardiopulmonary findings. Electronically Signed   By: Hart Robinsons M.D.   On: 04/16/2023 14:27    CUP PACEART REMOTE DEVICE CHECK Result Date: 03/23/2023 ILR summary report received. Battery status OK. Normal device function. No new symptom, brady, or pause episodes. No new AF episodes. Monthly summary reports and ROV/PRN 1 tachy event occurred 11/28 @ 07:51, duration 8sec, mean HR 162 PVC's=1.7% LA, CVRS   Microbiology: Results for orders placed or performed during the hospital encounter of 04/16/23  Resp panel by RT-PCR (RSV, Flu A&B, Covid) Anterior Nasal Swab     Status: Abnormal   Collection Time: 04/16/23  8:41 AM   Specimen: Anterior Nasal Swab  Result Value Ref Range Status   SARS Coronavirus 2 by RT PCR NEGATIVE NEGATIVE Final    Comment: (NOTE) SARS-CoV-2 target nucleic acids are NOT DETECTED.  The SARS-CoV-2 RNA is generally detectable in upper respiratory specimens during the acute phase of infection. The lowest concentration of SARS-CoV-2 viral copies this assay can detect is 138 copies/mL. A negative result does not preclude SARS-Cov-2 infection and should not be used as the sole basis for  treatment or other patient management decisions. A negative result may occur with  improper specimen collection/handling, submission of specimen other than nasopharyngeal swab, presence of viral mutation(s) within the areas targeted by this assay, and inadequate number of viral copies(<138 copies/mL). A negative result must be combined with clinical observations, patient history, and epidemiological information. The expected result is Negative.  Fact Sheet for Patients:  BloggerCourse.com  Fact Sheet for Healthcare Providers:  SeriousBroker.it  This test is no t yet approved or cleared by the [place] FDA and  has been authorized for detection and/or diagnosis of SARS-CoV-2 by FDA under an Emergency Use Authorization (EUA). This EUA will remain  in effect (meaning this test can be used) for the duration of the COVID-19  declaration under Section 564(b)(1) of the Act, 21 U.S.C.section 360bbb-3(b)(1), unless the authorization is terminated  or revoked sooner.       Influenza A by PCR POSITIVE (A) NEGATIVE Final   Influenza B by PCR NEGATIVE NEGATIVE Final    Comment: (NOTE) The Xpert Xpress SARS-CoV-2/FLU/RSV plus assay is intended as an aid in the diagnosis of influenza from Nasopharyngeal swab specimens and should not be used as a sole basis for treatment. Nasal washings and aspirates are unacceptable for Xpert Xpress SARS-CoV-2/FLU/RSV testing.  Fact Sheet for Patients: BloggerCourse.com  Fact Sheet for Healthcare Providers: SeriousBroker.it  This test is not yet approved or cleared by the [place] FDA and has been authorized for detection and/or diagnosis of SARS-CoV-2 by FDA under an Emergency Use Authorization (EUA). This EUA will remain in effect (meaning this test can be used) for the duration of the COVID-19 declaration under Section 564(b)(1) of the Act, 21 U.S.C. section 360bbb-3(b)(1), unless the authorization is terminated or revoked.     Resp Syncytial Virus by PCR NEGATIVE NEGATIVE Final    Comment: (NOTE) Fact Sheet for Patients: BloggerCourse.com  Fact Sheet for Healthcare Providers: SeriousBroker.it  This test is not yet approved or cleared by the [place] FDA and has been authorized for detection and/or diagnosis of SARS-CoV-2 by FDA under an Emergency Use Authorization (EUA). This EUA will remain in effect (meaning this test can be used) for the duration of the COVID-19 declaration under Section 564(b)(1) of the Act, 21 U.S.C. section 360bbb-3(b)(1), unless the authorization is terminated or revoked.  Performed at [facility], [address] [place], [place] [address]   Blood culture (routine x 2)     Status: None (Preliminary result)    Collection Time: 04/16/23  1:09 PM   Specimen: Left Antecubital; Blood  Result Value Ref Range Status   Specimen Description   Final    LEFT ANTECUBITAL Performed at [facility], [address] [place], [place] [address]    Special Requests   Final    BOTTLES DRAWN AEROBIC AND ANAEROBIC Blood Culture adequate volume Performed at [facility], [address], [place], [place] [address]    Culture   Final    NO GROWTH 2 DAYS Performed at [facility], [address], [place], [place] [address]    Report Status PENDING  Incomplete  Blood culture (routine x 2)     Status: None (Preliminary result)   Collection Time: 04/16/23  1:09 PM   Specimen: BLOOD LEFT FOREARM  Result Value Ref Range Status   Specimen Description   Final    BLOOD LEFT FOREARM Performed at [facility], [address] [place], [place] [address]  Special Requests   Final    AEROBIC BOTTLE ONLY Blood Culture adequate volume Performed at [facility], [address] [place], [place] [address]    Culture   Final    NO GROWTH 2 DAYS Performed at [facility], [address], [place], [place] [address]    Report Status PENDING  Incomplete    Labs: CBC: Recent Labs  Lab 04/16/23 0854 04/17/23 1338 04/18/23 0549  WBC 6.4 5.4 4.3  NEUTROABS  --  3.8  --   HGB 15.2* 14.2 14.0  HCT 45.4 42.3 43.7  MCV 89.5 87.8 92.0  PLT 304 244 207   Basic Metabolic Panel: Recent Labs  Lab 04/16/23 0854 04/17/23 1338 04/18/23 0549  NA 137 135 140  K 3.9 3.9 3.4*  CL 105 102 107  CO2 23 23 24   GLUCOSE 121* 113* 92  BUN 13 9 11   CREATININE 0.71 0.78 0.63  CALCIUM 9.0 8.4* 8.3*   Liver Function Tests: Recent Labs  Lab 04/16/23 0854 04/17/23 1338 04/18/23 0549  AST 71* 182* 125*  ALT 69* 124* 102*  ALKPHOS 50 46 42  BILITOT 0.6 0.6 0.6  PROT 6.9 6.7 5.9*  ALBUMIN 4.1 3.8 3.4*   CBG: No results for input(s):  "GLUCAP" in the last 168 hours.  Discharge time spent: greater than 30 minutes.  Signed: Tyrone Nine, MD [facility] 04/18/2023

## 2023-04-18 NOTE — Progress Notes (Signed)
SATURATION QUALIFICATIONS: (This note is used to comply with regulatory documentation for home oxygen)  Patient Saturations on Room Air at Rest = 97%  Patient Saturations on Room Air while Ambulating = 95%  Please briefly explain why patient needs home oxygen: N/A, patient does not qualify for home O2

## 2023-04-18 NOTE — TOC Initial Note (Addendum)
Transition of Care [facility]) - Initial/Assessment Note    Patient Details  Name: MAIDIE STREIGHT MRN: [id number] Date of Birth: 1950/09/20  Transition of Care [facility]) CM/SW Contact:    Adrian Prows, RN Phone Number: 04/18/2023, 10:44 AM  Clinical Narrative:                 Sherron Monday w/ pt in room; pt says she lives at home w/ her roommate; she plans to return at d/c; she identified POC son Lacey Jensen [phone]); she says her son will provide transportation; she denied SDOH risks; pt verified insurance/PCP; she has a walker; pt says she does not have home oxygen, and she attends OPPT; also I, Adrian Prows, RN, verbally reviewed observation notice;  no TOC needs;  Expected Discharge Plan: Home/Self Care Barriers to Discharge: No Barriers Identified   Patient Goals and CMS Choice Patient states their goals for this hospitalization and ongoing recovery are:: home CMS Medicare.gov Compare Post Acute Care list provided to:: Patient        Expected Discharge Plan and Services   Discharge Planning Services: CM Consult   Living arrangements for the past 2 months: Single Family Home Expected Discharge Date: 04/18/23               DME Arranged: N/A DME Agency: NA       HH Arranged: NA HH Agency: NA        Prior Living Arrangements/Services Living arrangements for the past 2 months: Single Family Home Lives with:: Roommate Patient language and need for interpreter reviewed:: Yes Do you feel safe going back to the place where you live?: Yes      Need for Family Participation in Patient Care: Yes (Comment) Care giver support system in place?: Yes (comment) Current home services: DME (walker) Criminal Activity/Legal Involvement Pertinent to Current Situation/Hospitalization: No - Comment as needed  Activities of Daily Living   ADL Screening (condition at time of admission) Independently performs ADLs?: Yes (appropriate for developmental age) Is the patient  deaf or have difficulty hearing?: No Does the patient have difficulty seeing, even when wearing glasses/contacts?: No Does the patient have difficulty concentrating, remembering, or making decisions?: No  Permission Sought/Granted Permission sought to share information with : Case Manager Permission granted to share information with : Yes, Verbal Permission Granted  Share Information with NAME: Case Manager     Permission granted to share info w Relationship: Abbey Chatters (son) [phone]     Emotional Assessment Appearance:: Appears stated age Attitude/Demeanor/Rapport: Gracious Affect (typically observed): Accepting Orientation: : Oriented to Self, Oriented to Place, Oriented to  Time, Oriented to Situation Alcohol / Substance Use: Not Applicable Psych Involvement: No (comment)  Admission diagnosis:  Influenza A [J10.1] Hypoxia [R09.02] Generalized weakness [R53.1] Influenza [J11.1] Patient Active Problem List   Diagnosis Date Noted   Influenza A 04/17/2023   Hypothyroidism 04/17/2023   Chest pain 10/27/2021   Constipation 10/27/2021   Diverticular disease of colon 10/27/2021   Fatty liver 10/27/2021   Internal hemorrhoids 10/27/2021   Sigmoid diverticulitis 05/30/2021   Abnormal liver diagnostic imaging 03/25/2018   Elevated LFTs 03/25/2018   Irritable bowel syndrome with constipation 04/28/2013   Depression 04/28/2013   Gastro-esophageal reflux disease without esophagitis 04/28/2013   COUGH 05/17/2008   PCP:  Farris Has, MD Pharmacy:   [facility] [address] [place], [place] - [address] [place] [address] Phone: [phone] Fax: [phone]  [facility] -  [facility] [address] [place] [address] Phone: [phone] Fax: [phone]     Social Drivers of Health (SDOH) Social History: SDOH Screenings   Food Insecurity: No Food Insecurity (04/18/2023)  Housing:  Low Risk  (04/18/2023)  Transportation Needs: No Transportation Needs (04/18/2023)  Utilities: Not At Risk (04/18/2023)  Social Connections: Unknown (07/25/2021)   [facility]  Tobacco Use: Low Risk  (04/17/2023)   SDOH Interventions: Food Insecurity Interventions: Intervention Not Indicated, Inpatient TOC Housing Interventions: Intervention Not Indicated, Inpatient TOC Transportation Interventions: Intervention Not Indicated, Inpatient TOC Utilities Interventions: Intervention Not Indicated, Inpatient TOC   Readmission Risk Interventions     No data to display

## 2023-04-18 NOTE — Plan of Care (Signed)

## 2023-04-18 NOTE — Care Management Obs Status (Signed)
MEDICARE OBSERVATION STATUS NOTIFICATION   Patient Details  Name: Amy Glenn MRN: [id number] Date of Birth: September 08, 1950   Medicare Observation Status Notification Given:  Yes    Adrian Prows, RN 04/18/2023, 10:40 AM

## 2023-04-21 LAB — CULTURE, BLOOD (ROUTINE X 2)
Culture: NO GROWTH
Culture: NO GROWTH
Special Requests: ADEQUATE
Special Requests: ADEQUATE

## 2023-04-26 ENCOUNTER — Ambulatory Visit (INDEPENDENT_AMBULATORY_CARE_PROVIDER_SITE_OTHER): Payer: PPO

## 2023-04-26 DIAGNOSIS — R002 Palpitations: Secondary | ICD-10-CM | POA: Diagnosis not present

## 2023-04-26 DIAGNOSIS — M609 Myositis, unspecified: Secondary | ICD-10-CM | POA: Diagnosis not present

## 2023-04-26 DIAGNOSIS — E2749 Other adrenocortical insufficiency: Secondary | ICD-10-CM | POA: Diagnosis not present

## 2023-04-27 LAB — CUP PACEART REMOTE DEVICE CHECK
Date Time Interrogation Session: 20250209232211
Implantable Pulse Generator Implant Date: 20240601

## 2023-05-03 NOTE — Progress Notes (Signed)
 Carelink Summary Report / Loop Recorder

## 2023-05-03 NOTE — Addendum Note (Signed)
Addended by: Geralyn Flash D on: 05/03/2023 10:27 AM   Modules accepted: Orders
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
## 2023-05-07 ENCOUNTER — Encounter: Payer: Self-pay | Admitting: Physician Assistant

## 2023-05-07 ENCOUNTER — Ambulatory Visit: Payer: PPO | Attending: Physician Assistant | Admitting: Physician Assistant

## 2023-05-07 VITALS — BP 112/78 | HR 99 | Ht 67.0 in | Wt 197.2 lb

## 2023-05-07 DIAGNOSIS — I493 Ventricular premature depolarization: Secondary | ICD-10-CM | POA: Diagnosis not present

## 2023-05-07 DIAGNOSIS — I471 Supraventricular tachycardia, unspecified: Secondary | ICD-10-CM | POA: Diagnosis not present

## 2023-05-07 DIAGNOSIS — Z95818 Presence of other cardiac implants and grafts: Secondary | ICD-10-CM

## 2023-05-07 DIAGNOSIS — R002 Palpitations: Secondary | ICD-10-CM | POA: Diagnosis not present

## 2023-05-07 NOTE — Patient Instructions (Signed)
Medication Instructions:   Your physician recommends that you continue on your current medications as directed. Please refer to the Current Medication list given to you today.   *If you need a refill on your cardiac medications before your next appointment, please call your pharmacy*   Lab Work: NONE ORDERED  TODAY    If you have labs (blood work) drawn today and your tests are completely normal, you will receive your results only by: MyChart Message (if you have MyChart) OR A paper copy in the mail If you have any lab test that is abnormal or we need to change your treatment, we will call you to review the results.   Testing/Procedures: NONE ORDERED  TODAY    Follow-Up: At [facility], you and your health needs are our priority.  As part of our continuing mission to provide you with exceptional heart care, we have created designated Provider Care Teams.  These Care Teams include your primary Cardiologist (physician) and Advanced Practice Providers (APPs -  Physician Assistants and Nurse Practitioners) who all work together to provide you with the care you need, when you need it.  We recommend signing up for the patient portal called "MyChart".  Sign up information is provided on this After Visit Summary.  MyChart is used to connect with patients for Virtual Visits (Telemedicine).  Patients are able to view lab/test results, encounter notes, upcoming appointments, etc.  Non-urgent messages can be sent to your provider as well.   To learn more about what you can do with MyChart, go to [email or web address]://www.mychart.com.    Your next appointment:   6 month(s)  Provider:   You may see Dr. Camnitz  or one of the following Advanced Practice Providers on your designated Care Team:   Renee Ursuy, PA-C   Other Instructions  

## 2023-05-14 DIAGNOSIS — R7989 Other specified abnormal findings of blood chemistry: Secondary | ICD-10-CM | POA: Diagnosis not present

## 2023-05-18 DIAGNOSIS — R7303 Prediabetes: Secondary | ICD-10-CM | POA: Diagnosis not present

## 2023-05-18 DIAGNOSIS — G4700 Insomnia, unspecified: Secondary | ICD-10-CM | POA: Diagnosis not present

## 2023-05-18 DIAGNOSIS — E785 Hyperlipidemia, unspecified: Secondary | ICD-10-CM | POA: Diagnosis not present

## 2023-05-18 DIAGNOSIS — Z6830 Body mass index (BMI) 30.0-30.9, adult: Secondary | ICD-10-CM | POA: Diagnosis not present

## 2023-05-18 DIAGNOSIS — R7401 Elevation of levels of liver transaminase levels: Secondary | ICD-10-CM | POA: Diagnosis not present

## 2023-05-18 DIAGNOSIS — E66811 Obesity, class 1: Secondary | ICD-10-CM | POA: Diagnosis not present

## 2023-05-21 DIAGNOSIS — R262 Difficulty in walking, not elsewhere classified: Secondary | ICD-10-CM | POA: Diagnosis not present

## 2023-05-28 DIAGNOSIS — E162 Hypoglycemia, unspecified: Secondary | ICD-10-CM | POA: Diagnosis not present

## 2023-05-31 ENCOUNTER — Ambulatory Visit (INDEPENDENT_AMBULATORY_CARE_PROVIDER_SITE_OTHER): Payer: PPO

## 2023-05-31 DIAGNOSIS — R002 Palpitations: Secondary | ICD-10-CM

## 2023-06-01 DIAGNOSIS — R262 Difficulty in walking, not elsewhere classified: Secondary | ICD-10-CM | POA: Diagnosis not present

## 2023-06-01 LAB — CUP PACEART REMOTE DEVICE CHECK
Date Time Interrogation Session: 20250316231938
Implantable Pulse Generator Implant Date: 20240601

## 2023-06-03 NOTE — Addendum Note (Signed)
 Addended by: Geralyn Flash D on: 06/03/2023 09:42 AM   Modules accepted: Orders

## 2023-06-03 NOTE — Progress Notes (Signed)
 Carelink Summary Report / Loop Recorder

## 2023-06-04 DIAGNOSIS — R262 Difficulty in walking, not elsewhere classified: Secondary | ICD-10-CM | POA: Diagnosis not present

## 2023-06-10 DIAGNOSIS — R262 Difficulty in walking, not elsewhere classified: Secondary | ICD-10-CM | POA: Diagnosis not present

## 2023-06-14 ENCOUNTER — Ambulatory Visit: Admitting: Orthopedic Surgery

## 2023-06-18 DIAGNOSIS — M542 Cervicalgia: Secondary | ICD-10-CM | POA: Diagnosis not present

## 2023-06-25 DIAGNOSIS — E039 Hypothyroidism, unspecified: Secondary | ICD-10-CM | POA: Diagnosis not present

## 2023-07-05 ENCOUNTER — Ambulatory Visit (INDEPENDENT_AMBULATORY_CARE_PROVIDER_SITE_OTHER): Payer: PPO

## 2023-07-05 DIAGNOSIS — R002 Palpitations: Secondary | ICD-10-CM

## 2023-07-06 DIAGNOSIS — H01119 Allergic dermatitis of unspecified eye, unspecified eyelid: Secondary | ICD-10-CM | POA: Insufficient documentation

## 2023-07-06 DIAGNOSIS — L821 Other seborrheic keratosis: Secondary | ICD-10-CM | POA: Insufficient documentation

## 2023-07-06 DIAGNOSIS — L814 Other melanin hyperpigmentation: Secondary | ICD-10-CM | POA: Insufficient documentation

## 2023-07-06 DIAGNOSIS — L710 Perioral dermatitis: Secondary | ICD-10-CM | POA: Insufficient documentation

## 2023-07-06 DIAGNOSIS — H01134 Eczematous dermatitis of left upper eyelid: Secondary | ICD-10-CM | POA: Insufficient documentation

## 2023-07-06 DIAGNOSIS — D1800 Hemangioma unspecified site: Secondary | ICD-10-CM | POA: Insufficient documentation

## 2023-07-06 DIAGNOSIS — D225 Melanocytic nevi of trunk: Secondary | ICD-10-CM | POA: Insufficient documentation

## 2023-07-06 DIAGNOSIS — Z85828 Personal history of other malignant neoplasm of skin: Secondary | ICD-10-CM | POA: Insufficient documentation

## 2023-07-06 DIAGNOSIS — C44519 Basal cell carcinoma of skin of other part of trunk: Secondary | ICD-10-CM | POA: Insufficient documentation

## 2023-07-06 DIAGNOSIS — D485 Neoplasm of uncertain behavior of skin: Secondary | ICD-10-CM | POA: Insufficient documentation

## 2023-07-06 DIAGNOSIS — C44612 Basal cell carcinoma of skin of right upper limb, including shoulder: Secondary | ICD-10-CM | POA: Insufficient documentation

## 2023-07-06 LAB — CUP PACEART REMOTE DEVICE CHECK
Date Time Interrogation Session: 20250420232051
Implantable Pulse Generator Implant Date: 20240601

## 2023-07-08 ENCOUNTER — Telehealth: Payer: Self-pay | Admitting: Neurology

## 2023-07-08 ENCOUNTER — Encounter: Payer: Self-pay | Admitting: Neurology

## 2023-07-08 ENCOUNTER — Ambulatory Visit: Payer: PPO | Admitting: Neurology

## 2023-07-08 VITALS — BP 126/84 | Ht 67.0 in | Wt 197.0 lb

## 2023-07-08 DIAGNOSIS — R410 Disorientation, unspecified: Secondary | ICD-10-CM | POA: Diagnosis not present

## 2023-07-08 DIAGNOSIS — R4189 Other symptoms and signs involving cognitive functions and awareness: Secondary | ICD-10-CM | POA: Diagnosis not present

## 2023-07-08 DIAGNOSIS — R531 Weakness: Secondary | ICD-10-CM | POA: Insufficient documentation

## 2023-07-08 NOTE — Telephone Encounter (Signed)
 no auth required sent to [facility] [phone]

## 2023-07-08 NOTE — Progress Notes (Signed)
 Chief Complaint  Patient presents with   New Patient (Initial Visit)    Rm 14, NP alone, complains of severe weakness mainly in legs but also in both arms, frequent falls, denies head strikes      ASSESSMENT AND PLAN  Amy Glenn is a 73 y.o. female   Mild cognitive impairment  MoCA 25/14 July 2023  CT head was essentially normal  Laboratory evaluation showed no treatable etiology  Worsening confusion in a stressful situation can be related to her underlying mild cognitive impairment, depression anxiety might contributed to, Intermittent weakness  Brisk reflex, chronic neck pain, CT cervical spine showed multilevel degenerative changes, most noticeable C5-6,  MRI of cervical spine to rule out cervical spondylitic myelopathy    DIAGNOSTIC DATA (LABS, IMAGING, TESTING) - I reviewed patient records, labs, notes, testing and imaging myself where available.   MEDICAL HISTORY:  Amy Glenn, is a 73 year old female, seen in request by her primary care from [facility] Dr. Ronna Coho, for evaluation of weakness, initial evaluation July 08, 2023    History is obtained from the patient and review of electronic medical records. I personally reviewed pertinent available imaging films in PACS.   PMHx of  HLD Hypothyrodism Chronic migraine Depression anxiety  She lives alone, her long history of depression and anxiety, reporting under reasonable control taking Paxil  20 mg daily  Since 2023, she has intermittent confusion, body weakness under stressful situations such as influenza, preparing for colonoscopy, she can get so weak, fell could not get up from floor, also confused, taking her medication by mistake,  Hospital admission in early February 2025 for influenza, weakness, mild elevation of AST, ALT,  CT head December 2024 showed no acute abnormality Cervical spine showed multilevel degenerative changes most noticeable C5-6,  She retired from [employer],  sister about the same age also complains of mild memory loss, she has sister has mild memory loss, MoCA examination 25/30 today, missed 2 out of 5 short-term recall  She has chronic neck pain,   Laboratory evaluation from [place] in February 2025 A1c was 5.8, mild elevation of thed,astent4,, LDL was 89, normal TSH 2.79, creatinine 0.86, hemoglobin of 14.4, B12 was within normal limit  PHYSICAL EXAM:   Vitals:   07/08/23 1050  BP: 126/84  Weight: 197 lb (89.4 kg)  Height: 5\' 7"  (1.702 m)   Not recorded     Body mass index is 30.85 kg/m.  PHYSICAL EXAMNIATION:  Gen: NAD, conversant, well nourised, well groomed                     Cardiovascular: Regular rate rhythm, no peripheral edema, warm, nontender. Eyes: Conjunctivae clear without exudates or hemorrhage Neck: Supple, no carotid bruits. Pulmonary: Clear to auscultation bilaterally   NEUROLOGICAL EXAM:  MENTAL STATUS: Speech/cognition: Awake, alert, oriented to history taking and casual conversation     07/08/2023   11:00 AM  Montreal Cognitive Assessment   Visuospatial/ Executive (0/5) 3  Naming (0/3) 3  Attention: Read list of digits (0/2) 2  Attention: Read list of letters (0/1) 1  Attention: Serial 7 subtraction starting at 100 (0/3) 3  Language: Repeat phrase (0/2) 2  Language : Fluency (0/1) 0  Abstraction (0/2) 2  Delayed Recall (0/5) 3  Orientation (0/6) 6  Total 25    CRANIAL NERVES: CN II: Visual fields are full to confrontation. Pupils are round equal and briskly reactive to light. CN III, IV, VI: extraocular movement are  normal. No ptosis. CN V: Facial sensation is intact to light touch CN VII: Face is symmetric with normal eye closure  CN VIII: Hearing is normal to causal conversation. CN IX, X: Phonation is normal. CN XI: Head turning and shoulder shrug are intact  MOTOR: There is no pronator drift of out-stretched arms. Muscle bulk and tone are normal. Muscle strength is  normal.  REFLEXES: Reflexes are 2+ and symmetric at the biceps, triceps, knees, and trace at ankles. Plantar responses are flexor.  SENSORY: Intact to light touch, pinprick and vibratory sensation are intact in fingers and toes.  COORDINATION: There is no trunk or limb dysmetria noted.  GAIT/STANCE: Get up from seated position arm crossed, cautious  REVIEW OF SYSTEMS:  Full 14 system review of systems performed and notable only for as above All other review of systems were negative.   ALLERGIES: No Known Allergies  HOME MEDICATIONS: Current Outpatient Medications  Medication Sig Dispense Refill   acidophilus (RISAQUAD) CAPS capsule Take 1 capsule by mouth daily.     amitriptyline  (ELAVIL ) 10 MG tablet Take 10 mg by mouth at bedtime.     b complex vitamins capsule Take 1 capsule by mouth daily.     benzonatate  (TESSALON ) 100 MG capsule Take 1 capsule (100 mg total) by mouth every 8 (eight) hours. 21 capsule 0   cholecalciferol  (VITAMIN D3) 25 MCG (1000 UNIT) tablet Take 1,000 Units by mouth daily.     dorzolamide -timolol  (COSOPT ) 22.3-6.8 MG/ML ophthalmic solution Place 1 drop into both eyes 2 (two) times daily.     EUTHYROX  50 MCG tablet Take 50 mcg by mouth every morning.     Guaifenesin  1200 MG TB12 Take 1 tablet (1,200 mg total) by mouth 2 (two) times daily at 10 AM and 5 PM. 14 tablet 0   ibuprofen  (ADVIL ) 200 MG tablet Take 3 tablets (600 mg total) by mouth every 6 (six) hours as needed (pain).     lansoprazole (PREVACID) 15 MG capsule Take 15 mg by mouth daily.     lidocaine  (LIDODERM ) 5 % Place 1 patch onto the skin daily. Remove & Discard patch within 12 hours or as directed by MD 30 patch 0   MAGNESIUM PO Take 1 tablet by mouth daily.     metoprolol  tartrate (LOPRESSOR ) 25 MG tablet Take 1 tablet (25 mg total) by mouth every 8 (eight) hours as needed (PALPITATIONS .Aaron Aas). 90 tablet 0   Multiple Vitamin (MULTIVITAMIN WITH MINERALS) TABS tablet Take 1 tablet by mouth daily.      PARoxetine  (PAXIL ) 20 MG tablet Take 20 mg by mouth every morning.     POTASSIUM CITRATE  PO Take 1 tablet by mouth daily.     rosuvastatin  (CRESTOR ) 10 MG tablet TAKE 1 TABLET BY MOUTH AT BEDTIME 90 tablet 3   vitamin E  180 MG (400 UNITS) capsule Take 400 Units by mouth daily.     zinc  gluconate 50 MG tablet Take 50 mg by mouth daily.     No current facility-administered medications for this visit.    PAST MEDICAL HISTORY: Past Medical History:  Diagnosis Date   Arthritis    Chronic bronchitis (HCC)    Depression    GERD (gastroesophageal reflux disease)    Hypothyroidism    Wears hearing aid    both ears    PAST SURGICAL HISTORY: Past Surgical History:  Procedure Laterality Date   APPENDECTOMY     CHOLECYSTECTOMY  03/16/1980   DILATION AND CURETTAGE OF UTERUS  03/16/2001  fibroids   ECTOPIC PREGNANCY SURGERY  1979x2   on rt   ECTOPIC PREGNANCY SURGERY  03/16/1978   x2 left   EXPLORATORY LAPAROTOMY  10/15/1975   for fertility   HERNIA REPAIR  03/16/1993   lt LA   KNEE ARTHROSCOPY Left 07/12/2012   Procedure: LEFT KNEE ARTHROSCOPY ;  Surgeon: Alphonzo Ask, MD;  Location: [facility];  Service: Orthopedics;  Laterality: Left;  Left knee arthroscopy with partial medial menisectomy and chondroplasty   LAPAROSCOPIC ABDOMINAL EXPLORATION  03/16/1976   repair fall tube scarring fertility   REPLACEMENT UNICONDYLAR JOINT KNEE Right 2010   REPLACEMENT UNICONDYLAR JOINT KNEE Left 2008   SHOULDER ARTHROSCOPY  03/17/2003   left   XI ROBOTIC ASSISTED LOWER ANTERIOR RESECTION N/A 05/30/2021   Procedure: XI ROBOTIC ASSISTED LOWER ANTERIOR RESECTION OF COLON RECTOSIGMOID, INTRAOPERATIVE ASSESSMENT OF PERFUSION USING FIREFLY, TAP BLOCK, AND RIGID PROCTOSCOPY;  Surgeon: Candyce Champagne, MD;  Location: [facility];  Service: General;  Laterality: N/A;    FAMILY HISTORY: Family History  Problem Relation Age of Onset   Hypertension Sister     SOCIAL HISTORY: Social  History   Socioeconomic History   Marital status: Divorced    Spouse name: Not on file   Number of children: Not on file   Years of education: Not on file   Highest education level: Not on file  Occupational History   Not on file  Tobacco Use   Smoking status: Never   Smokeless tobacco: Never  Vaping Use   Vaping status: Never Used  Substance and Sexual Activity   Alcohol use: Yes    Comment: occ   Drug use: No   Sexual activity: Not Currently  Other Topics Concern   Not on file  Social History Narrative   Left handed   Caffeine- 1 cup daily   Retired, 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   Social Drivers of Corporate investment banker Strain: Not on file  Food Insecurity: No Food Insecurity (04/18/2023)   Hunger Vital Sign    Worried About Radiation protection practitioner of Food in the Last Year: Never true    Ran Out of Food in the Last Year: Never true  Transportation Needs: No Transportation Needs (04/18/2023)   PRAPARE - Administrator, Civil Service (Medical): No    Lack of Transportation (Non-Medical): No  Physical Activity: Not on file  Stress: Not on file  Social Connections: Patient Declined (04/18/2023)   Social Connection and Isolation Panel [NHANES]    Frequency of Communication with Friends and Family: Patient declined    Frequency of Social Gatherings with Friends and Family: Patient declined    Attends Religious Services: Patient declined    Database administrator or Organizations: Patient declined    Attends Banker Meetings: Patient declined    Marital Status: Patient declined  Intimate Partner Violence: Not At Risk (04/18/2023)   Humiliation, Afraid, Rape, and Kick questionnaire    Fear of Current or Ex-Partner: No    Emotionally Abused: No    Physically Abused: No    Sexually Abused: No      Phebe Brasil, M.D. Ph.D.  [facility] [address] [place], [place] [address] Ph: [phone] Fax:  [phone]  CC:  Ronna Coho, MD [address] [place],  [place] [address]  Ronna Coho, MD

## 2023-07-09 DIAGNOSIS — R7303 Prediabetes: Secondary | ICD-10-CM | POA: Diagnosis not present

## 2023-07-09 DIAGNOSIS — F411 Generalized anxiety disorder: Secondary | ICD-10-CM | POA: Diagnosis not present

## 2023-07-09 DIAGNOSIS — E162 Hypoglycemia, unspecified: Secondary | ICD-10-CM | POA: Diagnosis not present

## 2023-07-09 DIAGNOSIS — E66811 Obesity, class 1: Secondary | ICD-10-CM | POA: Diagnosis not present

## 2023-07-09 DIAGNOSIS — E785 Hyperlipidemia, unspecified: Secondary | ICD-10-CM | POA: Diagnosis not present

## 2023-07-09 DIAGNOSIS — Z6830 Body mass index (BMI) 30.0-30.9, adult: Secondary | ICD-10-CM | POA: Diagnosis not present

## 2023-07-09 DIAGNOSIS — M542 Cervicalgia: Secondary | ICD-10-CM | POA: Diagnosis not present

## 2023-07-10 LAB — C-REACTIVE PROTEIN: CRP: <1 mg/L (ref 0–10)

## 2023-07-10 LAB — CK: Total CK: 64 U/L (ref 32–182)

## 2023-07-10 LAB — ACETYLCHOLINE RECEPTOR, BINDING: AChR Binding Ab, Serum: <0.07 nmol/L (ref 0.00–0.24)

## 2023-07-10 LAB — ANA W/REFLEX IF POSITIVE: Anti Nuclear Antibody (ANA): NEGATIVE

## 2023-07-10 LAB — SEDIMENTATION RATE: Sed Rate: 2 mm/h (ref 0–40)

## 2023-07-12 ENCOUNTER — Encounter: Payer: Self-pay | Admitting: Neurology

## 2023-07-14 DIAGNOSIS — E162 Hypoglycemia, unspecified: Secondary | ICD-10-CM | POA: Diagnosis not present

## 2023-07-14 DIAGNOSIS — R7303 Prediabetes: Secondary | ICD-10-CM | POA: Diagnosis not present

## 2023-07-15 DIAGNOSIS — M542 Cervicalgia: Secondary | ICD-10-CM | POA: Diagnosis not present

## 2023-07-19 NOTE — Progress Notes (Signed)
 Carelink Summary Report / Loop Recorder

## 2023-07-19 NOTE — Addendum Note (Signed)
 Addended by: Edra Govern D on: 07/19/2023 11:30 AM   Modules accepted: Orders

## 2023-07-21 DIAGNOSIS — M542 Cervicalgia: Secondary | ICD-10-CM | POA: Diagnosis not present

## 2023-07-23 ENCOUNTER — Ambulatory Visit (HOSPITAL_COMMUNITY)
Admission: RE | Admit: 2023-07-23 | Discharge: 2023-07-23 | Disposition: A | Discharge status: Home / Self Care | Source: Ambulatory Visit | Attending: Neurology | Admitting: Neurology

## 2023-07-23 DIAGNOSIS — R531 Weakness: Secondary | ICD-10-CM | POA: Insufficient documentation

## 2023-07-23 DIAGNOSIS — M4802 Spinal stenosis, cervical region: Secondary | ICD-10-CM | POA: Diagnosis not present

## 2023-07-23 DIAGNOSIS — M5021 Other cervical disc displacement,  high cervical region: Secondary | ICD-10-CM | POA: Diagnosis not present

## 2023-07-23 DIAGNOSIS — M50221 Other cervical disc displacement at C4-C5 level: Secondary | ICD-10-CM | POA: Diagnosis not present

## 2023-07-26 ENCOUNTER — Encounter (INDEPENDENT_AMBULATORY_CARE_PROVIDER_SITE_OTHER): Payer: Self-pay | Admitting: Neurology

## 2023-07-26 DIAGNOSIS — R4189 Other symptoms and signs involving cognitive functions and awareness: Secondary | ICD-10-CM

## 2023-07-26 DIAGNOSIS — R410 Disorientation, unspecified: Secondary | ICD-10-CM

## 2023-07-26 DIAGNOSIS — W19XXXS Unspecified fall, sequela: Secondary | ICD-10-CM

## 2023-07-26 DIAGNOSIS — R531 Weakness: Secondary | ICD-10-CM

## 2023-07-27 DIAGNOSIS — W19XXXA Unspecified fall, initial encounter: Secondary | ICD-10-CM | POA: Insufficient documentation

## 2023-07-27 NOTE — Telephone Encounter (Signed)
 Orders Placed This Encounter  Procedures   Ambulatory referral to Physical Therapy   [facility]  Address: [address] [place], [place] [address] Phone: [phone]    I have called patient, she has few falls over the past few weeks, is receiving physical therapy for her neck pain, add on physical therapy for gait training,  Personally reviewed MRI of cervical spine, multilevel degenerative changes but there was no evidence of significant canal stenosis, no cord compression to explain her current fall,  She is to continue physical therapy, will call clinic if remains symptomatic   Please see the MyChart message reply(ies) for my assessment and plan.    This patient gave consent for this Medical Advice Message and is aware that it may result in a bill to [employer], as well as the possibility of receiving a bill for a co-payment or deductible. They are an established patient, but are not seeking medical advice exclusively about a problem treated during an in person or video visit in the last seven days. I did not recommend an in person or video visit within seven days of my reply.    I spent a total of 8 minutes cumulative time within 7 days through [employer].  Phebe Brasil, MD

## 2023-07-28 ENCOUNTER — Telehealth: Payer: Self-pay | Admitting: Neurology

## 2023-07-28 ENCOUNTER — Telehealth (INDEPENDENT_AMBULATORY_CARE_PROVIDER_SITE_OTHER): Admitting: Neurology

## 2023-07-28 DIAGNOSIS — R4189 Other symptoms and signs involving cognitive functions and awareness: Secondary | ICD-10-CM

## 2023-07-28 DIAGNOSIS — R531 Weakness: Secondary | ICD-10-CM

## 2023-07-28 DIAGNOSIS — W19XXXS Unspecified fall, sequela: Secondary | ICD-10-CM

## 2023-07-28 NOTE — Telephone Encounter (Signed)
 Referral for physical therapy fax to [facility]. Phone: [phone], Fax: [phone]

## 2023-07-28 NOTE — Progress Notes (Signed)
 ASSESSMENT AND PLAN  Amy Glenn is a 73 y.o. female   Mild cognitive impairment  MoCA 25/14 July 2023  CT head was essentially normal  Laboratory evaluation showed no treatable etiology  Worsening confusion in a stressful situation can be related to her underlying mild cognitive impairment, depression anxiety might contributed to,  Intermittent weakness, occasionally fall,     MRI of cervical spine showed degenerative changes but no evidence of cord compression  Referred to physical therapy    DIAGNOSTIC DATA (LABS, IMAGING, TESTING) - I reviewed patient records, labs, notes, testing and imaging myself where available.   MEDICAL HISTORY:  Amy LUEVANO, is a 73 year old female, seen in request by her primary care from [facility] Dr. Ronna Coho, for evaluation of weakness, initial evaluation July 08, 2023    History is obtained from the patient and review of electronic medical records. I personally reviewed pertinent available imaging films in PACS.   PMHx of  HLD Hypothyrodism Chronic migraine Depression anxiety  She lives alone, her long history of depression and anxiety, reporting under reasonable control taking Paxil  20 mg daily  Since 2023, she has intermittent confusion, body weakness under stressful situations such as influenza, preparing for colonoscopy, she can get so weak, fell could not get up from floor, also confused, taking her medication by mistake,  Hospital admission in early February 2025 for influenza, weakness, mild elevation of AST, ALT,  CT head December 2024 showed no acute abnormality Cervical spine showed multilevel degenerative changes most noticeable C5-6,  She retired from [employer], sister about the same age also complains of mild memory loss,  MoCA examination 25/30 today, missed 2 out of 5 short-term recall  She has chronic neck pain,   Laboratory evaluation from [place] in February 2025 A1c was 5.8, mild elevation  of thed,astent4,, LDL was 89, normal TSH 2.79, creatinine 0.86, hemoglobin of 14.4, B12 was within normal limit   Virtual Visit via video UPDATE Jul 28 2023 I discussed the limitations of evaluation and management by telemedicine and the availability of in person appointments. The patient expressed understanding and agreed to proceed  Location: Provider: [facility] office; Patient: Home  I connected with Amy Glenn  on May 14th 2025 by a video enabled telemedicine application and verified that I am speaking with the correct person using two identifiers.  UPDATED HiSTORY She complains of mild steady gait, fell backwards after getting up from low squatting position few days ago, now with tailbone pain,  She was seen by her primary care, suspicious for low glucose, on continuous glucose monitoring, when the incident happened, reported glucose level was in 50s, she denies history of diabetes, concerned about her unsteady gait, would like to have physical therapy  We personally reviewed MRI of cervical spine from May 2025, there was no formal report yet multilevel degenerative changes, variable degree of foraminal narrowing, no cord compression   Observations/Objective: I have reviewed problem lists, medications, allergies.   Awake, alert, oriented to history taking and casual conversation, facial symmetric, no dysarthria, no aphasia, mildly cautious gait,    REVIEW OF SYSTEMS:  Full 14 system review of systems performed and notable only for as above All other review of systems were negative.   ALLERGIES: No Known Allergies  HOME MEDICATIONS: Current Outpatient Medications  Medication Sig Dispense Refill   acidophilus (RISAQUAD) CAPS capsule Take 1 capsule by mouth daily.     amitriptyline  (ELAVIL ) 10 MG tablet Take 10  mg by mouth at bedtime.     b complex vitamins capsule Take 1 capsule by mouth daily.     benzonatate  (TESSALON ) 100 MG capsule Take 1 capsule (100 mg total) by mouth  every 8 (eight) hours. 21 capsule 0   cholecalciferol  (VITAMIN D3) 25 MCG (1000 UNIT) tablet Take 1,000 Units by mouth daily.     dorzolamide -timolol  (COSOPT ) 22.3-6.8 MG/ML ophthalmic solution Place 1 drop into both eyes 2 (two) times daily.     EUTHYROX  50 MCG tablet Take 50 mcg by mouth every morning.     Guaifenesin  1200 MG TB12 Take 1 tablet (1,200 mg total) by mouth 2 (two) times daily at 10 AM and 5 PM. 14 tablet 0   ibuprofen  (ADVIL ) 200 MG tablet Take 3 tablets (600 mg total) by mouth every 6 (six) hours as needed (pain).     lansoprazole (PREVACID) 15 MG capsule Take 15 mg by mouth daily.     lidocaine  (LIDODERM ) 5 % Place 1 patch onto the skin daily. Remove & Discard patch within 12 hours or as directed by MD 30 patch 0   MAGNESIUM PO Take 1 tablet by mouth daily.     metoprolol  tartrate (LOPRESSOR ) 25 MG tablet Take 1 tablet (25 mg total) by mouth every 8 (eight) hours as needed (PALPITATIONS .Aaron Aas). 90 tablet 0   Multiple Vitamin (MULTIVITAMIN WITH MINERALS) TABS tablet Take 1 tablet by mouth daily.     PARoxetine  (PAXIL ) 20 MG tablet Take 20 mg by mouth every morning.     POTASSIUM CITRATE  PO Take 1 tablet by mouth daily.     rosuvastatin  (CRESTOR ) 10 MG tablet TAKE 1 TABLET BY MOUTH AT BEDTIME 90 tablet 3   vitamin E  180 MG (400 UNITS) capsule Take 400 Units by mouth daily.     zinc  gluconate 50 MG tablet Take 50 mg by mouth daily.     No current facility-administered medications for this visit.    PAST MEDICAL HISTORY: Past Medical History:  Diagnosis Date   Arthritis    Chronic bronchitis (HCC)    Depression    GERD (gastroesophageal reflux disease)    Hypothyroidism    Wears hearing aid    both ears    PAST SURGICAL HISTORY: Past Surgical History:  Procedure Laterality Date   APPENDECTOMY     CHOLECYSTECTOMY  03/16/1980   DILATION AND CURETTAGE OF UTERUS  03/16/2001   fibroids   ECTOPIC PREGNANCY SURGERY  1979x2   on rt   ECTOPIC PREGNANCY SURGERY  03/16/1978    x2 left   EXPLORATORY LAPAROTOMY  10/15/1975   for fertility   HERNIA REPAIR  03/16/1993   lt LA   KNEE ARTHROSCOPY Left 07/12/2012   Procedure: LEFT KNEE ARTHROSCOPY ;  Surgeon: Alphonzo Ask, MD;  Location: [facility];  Service: Orthopedics;  Laterality: Left;  Left knee arthroscopy with partial medial menisectomy and chondroplasty   LAPAROSCOPIC ABDOMINAL EXPLORATION  03/16/1976   repair fall tube scarring fertility   REPLACEMENT UNICONDYLAR JOINT KNEE Right 2010   REPLACEMENT UNICONDYLAR JOINT KNEE Left 2008   SHOULDER ARTHROSCOPY  03/17/2003   left   XI ROBOTIC ASSISTED LOWER ANTERIOR RESECTION N/A 05/30/2021   Procedure: XI ROBOTIC ASSISTED LOWER ANTERIOR RESECTION OF COLON RECTOSIGMOID, INTRAOPERATIVE ASSESSMENT OF PERFUSION USING FIREFLY, TAP BLOCK, AND RIGID PROCTOSCOPY;  Surgeon: Candyce Champagne, MD;  Location: [facility];  Service: General;  Laterality: N/A;    FAMILY HISTORY: Family History  Problem Relation Age of Onset  Hypertension Sister     SOCIAL HISTORY: Social History   Socioeconomic History   Marital status: Divorced    Spouse name: Not on file   Number of children: Not on file   Years of education: Not on file   Highest education level: Not on file  Occupational History   Not on file  Tobacco Use   Smoking status: Never   Smokeless tobacco: Never  Vaping Use   Vaping status: Never Used  Substance and Sexual Activity   Alcohol use: Yes    Comment: occ   Drug use: No   Sexual activity: Not Currently  Other Topics Concern   Not on file  Social History Narrative   Left handed   Caffeine- 1 cup daily   Retired, 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   Social Drivers of Corporate investment banker Strain: Not on file  Food Insecurity: No Food Insecurity (04/18/2023)   Hunger Vital Sign    Worried About Running Out of Food in the Last Year: Never true    Ran Out of Food in the Last Year: Never true   Transportation Needs: No Transportation Needs (04/18/2023)   PRAPARE - Administrator, Civil Service (Medical): No    Lack of Transportation (Non-Medical): No  Physical Activity: Not on file  Stress: Not on file  Social Connections: Patient Declined (04/18/2023)   Social Connection and Isolation Panel [NHANES]    Frequency of Communication with Friends and Family: Patient declined    Frequency of Social Gatherings with Friends and Family: Patient declined    Attends Religious Services: Patient declined    Database administrator or Organizations: Patient declined    Attends Banker Meetings: Patient declined    Marital Status: Patient declined  Intimate Partner Violence: Not At Risk (04/18/2023)   Humiliation, Afraid, Rape, and Kick questionnaire    Fear of Current or Ex-Partner: No    Emotionally Abused: No    Physically Abused: No    Sexually Abused: No      Phebe Brasil, M.D. Ph.D.  [facility] [address] [place], [place] [address] Ph: [phone] Fax: [phone]  CC:  Ronna Coho, MD [address] [place],  [place] [address]  Ronna Coho, MD

## 2023-07-30 DIAGNOSIS — M542 Cervicalgia: Secondary | ICD-10-CM | POA: Diagnosis not present

## 2023-08-02 DIAGNOSIS — W19XXXA Unspecified fall, initial encounter: Secondary | ICD-10-CM | POA: Diagnosis not present

## 2023-08-02 DIAGNOSIS — E039 Hypothyroidism, unspecified: Secondary | ICD-10-CM | POA: Diagnosis not present

## 2023-08-02 DIAGNOSIS — R945 Abnormal results of liver function studies: Secondary | ICD-10-CM | POA: Diagnosis not present

## 2023-08-02 DIAGNOSIS — R531 Weakness: Secondary | ICD-10-CM | POA: Diagnosis not present

## 2023-08-03 DIAGNOSIS — M542 Cervicalgia: Secondary | ICD-10-CM | POA: Diagnosis not present

## 2023-08-05 ENCOUNTER — Telehealth: Payer: Self-pay | Admitting: Neurology

## 2023-08-05 NOTE — Telephone Encounter (Signed)
 May 22 at 1:55pm Pt LVM  requesting to schedule appt .  Callback pt ,No Answer ,LVM

## 2023-08-10 ENCOUNTER — Ambulatory Visit: Payer: Self-pay | Admitting: Neurology

## 2023-08-10 DIAGNOSIS — M542 Cervicalgia: Secondary | ICD-10-CM | POA: Diagnosis not present

## 2023-08-11 ENCOUNTER — Ambulatory Visit (INDEPENDENT_AMBULATORY_CARE_PROVIDER_SITE_OTHER): Payer: PPO

## 2023-08-11 ENCOUNTER — Telehealth: Admitting: Neurology

## 2023-08-11 DIAGNOSIS — R55 Syncope and collapse: Secondary | ICD-10-CM | POA: Diagnosis not present

## 2023-08-12 ENCOUNTER — Telehealth: Payer: Self-pay | Admitting: Neurology

## 2023-08-12 LAB — CUP PACEART REMOTE DEVICE CHECK
Date Time Interrogation Session: 20250527232646
Implantable Pulse Generator Implant Date: 20240601

## 2023-08-12 NOTE — Telephone Encounter (Signed)
 Lvm 1st attempt by hf

## 2023-08-12 NOTE — Telephone Encounter (Signed)
 Pt has returned call to [facility]

## 2023-08-12 NOTE — Telephone Encounter (Signed)
 Called and scheduled appt

## 2023-08-12 NOTE — Telephone Encounter (Signed)
 Pt returning call . Pt states she will wait for [place] to call back .

## 2023-08-12 NOTE — Telephone Encounter (Signed)
 Called to patient to offer visit. No answer. LVM to call office back.

## 2023-08-13 ENCOUNTER — Ambulatory Visit: Payer: Self-pay | Admitting: Cardiology

## 2023-08-13 DIAGNOSIS — M542 Cervicalgia: Secondary | ICD-10-CM | POA: Diagnosis not present

## 2023-08-13 DIAGNOSIS — M545 Low back pain, unspecified: Secondary | ICD-10-CM | POA: Diagnosis not present

## 2023-08-18 DIAGNOSIS — M542 Cervicalgia: Secondary | ICD-10-CM | POA: Diagnosis not present

## 2023-08-19 DIAGNOSIS — K760 Fatty (change of) liver, not elsewhere classified: Secondary | ICD-10-CM | POA: Diagnosis not present

## 2023-08-19 DIAGNOSIS — E785 Hyperlipidemia, unspecified: Secondary | ICD-10-CM | POA: Diagnosis not present

## 2023-08-19 DIAGNOSIS — E041 Nontoxic single thyroid nodule: Secondary | ICD-10-CM | POA: Diagnosis not present

## 2023-08-19 DIAGNOSIS — I700 Atherosclerosis of aorta: Secondary | ICD-10-CM | POA: Diagnosis not present

## 2023-08-19 DIAGNOSIS — R2689 Other abnormalities of gait and mobility: Secondary | ICD-10-CM | POA: Diagnosis not present

## 2023-08-19 DIAGNOSIS — R296 Repeated falls: Secondary | ICD-10-CM | POA: Diagnosis not present

## 2023-08-19 DIAGNOSIS — E559 Vitamin D deficiency, unspecified: Secondary | ICD-10-CM | POA: Diagnosis not present

## 2023-08-19 DIAGNOSIS — E039 Hypothyroidism, unspecified: Secondary | ICD-10-CM | POA: Diagnosis not present

## 2023-08-20 DIAGNOSIS — E039 Hypothyroidism, unspecified: Secondary | ICD-10-CM | POA: Diagnosis not present

## 2023-08-20 DIAGNOSIS — M545 Low back pain, unspecified: Secondary | ICD-10-CM | POA: Diagnosis not present

## 2023-08-24 NOTE — Addendum Note (Signed)
 Addended by: Edra Govern D on: 08/24/2023 03:02 PM   Modules accepted: Orders

## 2023-08-24 NOTE — Progress Notes (Signed)
 Carelink Summary Report / Loop Recorder

## 2023-08-26 ENCOUNTER — Other Ambulatory Visit: Payer: Self-pay | Admitting: Internal Medicine

## 2023-08-26 DIAGNOSIS — R296 Repeated falls: Secondary | ICD-10-CM

## 2023-08-27 DIAGNOSIS — M542 Cervicalgia: Secondary | ICD-10-CM | POA: Diagnosis not present

## 2023-08-28 ENCOUNTER — Ambulatory Visit
Admission: RE | Admit: 2023-08-28 | Discharge: 2023-08-28 | Disposition: A | Discharge status: Home / Self Care | Source: Ambulatory Visit | Attending: Internal Medicine | Admitting: Internal Medicine

## 2023-08-28 DIAGNOSIS — R296 Repeated falls: Secondary | ICD-10-CM

## 2023-08-28 DIAGNOSIS — R531 Weakness: Secondary | ICD-10-CM | POA: Diagnosis not present

## 2023-08-30 DIAGNOSIS — R531 Weakness: Secondary | ICD-10-CM | POA: Diagnosis not present

## 2023-08-30 DIAGNOSIS — R7303 Prediabetes: Secondary | ICD-10-CM | POA: Diagnosis not present

## 2023-08-30 DIAGNOSIS — Z8639 Personal history of other endocrine, nutritional and metabolic disease: Secondary | ICD-10-CM | POA: Diagnosis not present

## 2023-08-30 DIAGNOSIS — R2689 Other abnormalities of gait and mobility: Secondary | ICD-10-CM | POA: Diagnosis not present

## 2023-08-30 DIAGNOSIS — E663 Overweight: Secondary | ICD-10-CM | POA: Diagnosis not present

## 2023-08-30 DIAGNOSIS — K760 Fatty (change of) liver, not elsewhere classified: Secondary | ICD-10-CM | POA: Diagnosis not present

## 2023-08-30 DIAGNOSIS — E785 Hyperlipidemia, unspecified: Secondary | ICD-10-CM | POA: Diagnosis not present

## 2023-08-30 DIAGNOSIS — Z6829 Body mass index (BMI) 29.0-29.9, adult: Secondary | ICD-10-CM | POA: Diagnosis not present

## 2023-08-31 DIAGNOSIS — M5416 Radiculopathy, lumbar region: Secondary | ICD-10-CM | POA: Diagnosis not present

## 2023-08-31 DIAGNOSIS — M5412 Radiculopathy, cervical region: Secondary | ICD-10-CM | POA: Diagnosis not present

## 2023-09-02 DIAGNOSIS — R202 Paresthesia of skin: Secondary | ICD-10-CM | POA: Diagnosis not present

## 2023-09-02 DIAGNOSIS — M542 Cervicalgia: Secondary | ICD-10-CM | POA: Diagnosis not present

## 2023-09-02 DIAGNOSIS — M6281 Muscle weakness (generalized): Secondary | ICD-10-CM | POA: Diagnosis not present

## 2023-09-02 DIAGNOSIS — R262 Difficulty in walking, not elsewhere classified: Secondary | ICD-10-CM | POA: Diagnosis not present

## 2023-09-03 DIAGNOSIS — H40023 Open angle with borderline findings, high risk, bilateral: Secondary | ICD-10-CM | POA: Diagnosis not present

## 2023-09-06 DIAGNOSIS — M5416 Radiculopathy, lumbar region: Secondary | ICD-10-CM | POA: Diagnosis not present

## 2023-09-07 DIAGNOSIS — R262 Difficulty in walking, not elsewhere classified: Secondary | ICD-10-CM | POA: Diagnosis not present

## 2023-09-07 DIAGNOSIS — M542 Cervicalgia: Secondary | ICD-10-CM | POA: Diagnosis not present

## 2023-09-07 DIAGNOSIS — R202 Paresthesia of skin: Secondary | ICD-10-CM | POA: Diagnosis not present

## 2023-09-07 DIAGNOSIS — M6281 Muscle weakness (generalized): Secondary | ICD-10-CM | POA: Diagnosis not present

## 2023-09-10 ENCOUNTER — Ambulatory Visit (INDEPENDENT_AMBULATORY_CARE_PROVIDER_SITE_OTHER)

## 2023-09-10 DIAGNOSIS — M542 Cervicalgia: Secondary | ICD-10-CM | POA: Diagnosis not present

## 2023-09-10 DIAGNOSIS — R55 Syncope and collapse: Secondary | ICD-10-CM

## 2023-09-10 DIAGNOSIS — M6281 Muscle weakness (generalized): Secondary | ICD-10-CM | POA: Diagnosis not present

## 2023-09-10 DIAGNOSIS — R262 Difficulty in walking, not elsewhere classified: Secondary | ICD-10-CM | POA: Diagnosis not present

## 2023-09-10 DIAGNOSIS — R202 Paresthesia of skin: Secondary | ICD-10-CM | POA: Diagnosis not present

## 2023-09-10 LAB — CUP PACEART REMOTE DEVICE CHECK
Date Time Interrogation Session: 20250626233710
Implantable Pulse Generator Implant Date: 20240601

## 2023-09-12 ENCOUNTER — Ambulatory Visit: Payer: Self-pay | Admitting: Cardiology

## 2023-09-14 DIAGNOSIS — M542 Cervicalgia: Secondary | ICD-10-CM | POA: Diagnosis not present

## 2023-09-14 DIAGNOSIS — M6281 Muscle weakness (generalized): Secondary | ICD-10-CM | POA: Diagnosis not present

## 2023-09-14 DIAGNOSIS — R202 Paresthesia of skin: Secondary | ICD-10-CM | POA: Diagnosis not present

## 2023-09-14 DIAGNOSIS — R262 Difficulty in walking, not elsewhere classified: Secondary | ICD-10-CM | POA: Diagnosis not present

## 2023-09-16 DIAGNOSIS — R262 Difficulty in walking, not elsewhere classified: Secondary | ICD-10-CM | POA: Diagnosis not present

## 2023-09-16 DIAGNOSIS — M6281 Muscle weakness (generalized): Secondary | ICD-10-CM | POA: Diagnosis not present

## 2023-09-16 DIAGNOSIS — D241 Benign neoplasm of right breast: Secondary | ICD-10-CM | POA: Diagnosis not present

## 2023-09-16 DIAGNOSIS — M542 Cervicalgia: Secondary | ICD-10-CM | POA: Diagnosis not present

## 2023-09-16 DIAGNOSIS — N6315 Unspecified lump in the right breast, overlapping quadrants: Secondary | ICD-10-CM | POA: Diagnosis not present

## 2023-09-16 DIAGNOSIS — R202 Paresthesia of skin: Secondary | ICD-10-CM | POA: Diagnosis not present

## 2023-09-16 DIAGNOSIS — R92333 Mammographic heterogeneous density, bilateral breasts: Secondary | ICD-10-CM | POA: Diagnosis not present

## 2023-09-16 LAB — HM MAMMOGRAPHY

## 2023-09-21 DIAGNOSIS — M542 Cervicalgia: Secondary | ICD-10-CM | POA: Diagnosis not present

## 2023-09-21 DIAGNOSIS — R202 Paresthesia of skin: Secondary | ICD-10-CM | POA: Diagnosis not present

## 2023-09-21 DIAGNOSIS — M6281 Muscle weakness (generalized): Secondary | ICD-10-CM | POA: Diagnosis not present

## 2023-09-21 DIAGNOSIS — R262 Difficulty in walking, not elsewhere classified: Secondary | ICD-10-CM | POA: Diagnosis not present

## 2023-09-22 DIAGNOSIS — M7751 Other enthesopathy of right foot: Secondary | ICD-10-CM | POA: Diagnosis not present

## 2023-09-22 DIAGNOSIS — L97521 Non-pressure chronic ulcer of other part of left foot limited to breakdown of skin: Secondary | ICD-10-CM | POA: Diagnosis not present

## 2023-09-22 DIAGNOSIS — M19072 Primary osteoarthritis, left ankle and foot: Secondary | ICD-10-CM | POA: Diagnosis not present

## 2023-09-24 DIAGNOSIS — M6281 Muscle weakness (generalized): Secondary | ICD-10-CM | POA: Diagnosis not present

## 2023-09-24 DIAGNOSIS — M542 Cervicalgia: Secondary | ICD-10-CM | POA: Diagnosis not present

## 2023-09-24 DIAGNOSIS — R262 Difficulty in walking, not elsewhere classified: Secondary | ICD-10-CM | POA: Diagnosis not present

## 2023-09-24 DIAGNOSIS — R202 Paresthesia of skin: Secondary | ICD-10-CM | POA: Diagnosis not present

## 2023-09-28 DIAGNOSIS — R7303 Prediabetes: Secondary | ICD-10-CM | POA: Diagnosis not present

## 2023-09-28 DIAGNOSIS — E559 Vitamin D deficiency, unspecified: Secondary | ICD-10-CM | POA: Diagnosis not present

## 2023-09-28 DIAGNOSIS — Z6830 Body mass index (BMI) 30.0-30.9, adult: Secondary | ICD-10-CM | POA: Diagnosis not present

## 2023-09-28 DIAGNOSIS — F411 Generalized anxiety disorder: Secondary | ICD-10-CM | POA: Diagnosis not present

## 2023-09-28 DIAGNOSIS — E66811 Obesity, class 1: Secondary | ICD-10-CM | POA: Diagnosis not present

## 2023-09-30 DIAGNOSIS — M4802 Spinal stenosis, cervical region: Secondary | ICD-10-CM | POA: Diagnosis not present

## 2023-10-04 DIAGNOSIS — M542 Cervicalgia: Secondary | ICD-10-CM | POA: Diagnosis not present

## 2023-10-04 DIAGNOSIS — R202 Paresthesia of skin: Secondary | ICD-10-CM | POA: Diagnosis not present

## 2023-10-04 DIAGNOSIS — M6281 Muscle weakness (generalized): Secondary | ICD-10-CM | POA: Diagnosis not present

## 2023-10-04 DIAGNOSIS — R262 Difficulty in walking, not elsewhere classified: Secondary | ICD-10-CM | POA: Diagnosis not present

## 2023-10-04 NOTE — Addendum Note (Signed)
 Addended by: VICCI SELLER A on: 10/04/2023 09:22 AM   Modules accepted: Orders

## 2023-10-04 NOTE — Progress Notes (Signed)
 Carelink Summary Report / Loop Recorder

## 2023-10-05 ENCOUNTER — Telehealth: Payer: Self-pay | Admitting: Neurology

## 2023-10-05 NOTE — Telephone Encounter (Signed)
 Patient cancelling due to seeing a different neurologist

## 2023-10-08 DIAGNOSIS — R202 Paresthesia of skin: Secondary | ICD-10-CM | POA: Diagnosis not present

## 2023-10-08 DIAGNOSIS — R262 Difficulty in walking, not elsewhere classified: Secondary | ICD-10-CM | POA: Diagnosis not present

## 2023-10-08 DIAGNOSIS — M542 Cervicalgia: Secondary | ICD-10-CM | POA: Diagnosis not present

## 2023-10-08 DIAGNOSIS — M6281 Muscle weakness (generalized): Secondary | ICD-10-CM | POA: Diagnosis not present

## 2023-10-11 ENCOUNTER — Ambulatory Visit (INDEPENDENT_AMBULATORY_CARE_PROVIDER_SITE_OTHER)

## 2023-10-11 DIAGNOSIS — R55 Syncope and collapse: Secondary | ICD-10-CM

## 2023-10-12 ENCOUNTER — Ambulatory Visit: Admitting: Neurology

## 2023-10-12 DIAGNOSIS — M5412 Radiculopathy, cervical region: Secondary | ICD-10-CM | POA: Diagnosis not present

## 2023-10-12 DIAGNOSIS — M542 Cervicalgia: Secondary | ICD-10-CM | POA: Diagnosis not present

## 2023-10-12 LAB — CUP PACEART REMOTE DEVICE CHECK
Date Time Interrogation Session: 20250727234243
Implantable Pulse Generator Implant Date: 20240601

## 2023-10-13 ENCOUNTER — Ambulatory Visit: Payer: Self-pay | Admitting: Cardiology

## 2023-10-14 ENCOUNTER — Other Ambulatory Visit: Payer: Self-pay | Admitting: Neurological Surgery

## 2023-10-18 DIAGNOSIS — M5412 Radiculopathy, cervical region: Secondary | ICD-10-CM | POA: Diagnosis not present

## 2023-10-27 ENCOUNTER — Telehealth: Payer: Self-pay | Admitting: Cardiology

## 2023-10-27 NOTE — Telephone Encounter (Signed)
 Calling to speak with the nurse about the patient. Please advise

## 2023-10-27 NOTE — Telephone Encounter (Signed)
 Attempted to return call. Unable to complete call due to office being closed.

## 2023-10-29 ENCOUNTER — Encounter (HOSPITAL_COMMUNITY): Payer: Self-pay

## 2023-10-29 NOTE — Progress Notes (Signed)
 Surgical Instructions   Your procedure is scheduled on Wednesday November 10, 2023. Report to [facility] Main Entrance A at 8:50 A.M., then check in with the Admitting office. Any questions or running late day of surgery: call [phone]  Questions prior to your surgery date: call [phone], Monday-Friday, 8am-4pm. If you experience any cold or flu symptoms such as cough, fever, chills, shortness of breath, etc. between now and your scheduled surgery, please notify us  at the above number.     Remember:  Do not eat or drink after midnight the night before your surgery  Take these medicines the morning of surgery with A SIP OF WATER   buPROPion ER (WELLBUTRIN SR)  dorzolamide -timolol  (COSOPT ) 22.3-6.8 MG/ML ophthalmic solution  lansoprazole (PREVACID)  levothyroxine  (SYNTHROID )  PARoxetine  (PAXIL )   One week prior to surgery, STOP taking any Aspirin (unless otherwise instructed by your surgeon) Aleve , Naproxen , Ibuprofen , Motrin , Advil , Goody's, BC's, all herbal medications, fish oil, and non-prescription vitamins.                     Do NOT Smoke (Tobacco/Vaping) for 24 hours prior to your procedure.  If you use a CPAP at night, you may bring your mask/headgear for your overnight stay.   You will be asked to remove any contacts, glasses, piercing's, hearing aid's, dentures/partials prior to surgery. Please bring cases for these items if needed.    Patients discharged the day of surgery will not be allowed to drive home, and someone needs to stay with them for 24 hours.  SURGICAL WAITING ROOM VISITATION Patients may have no more than 2 support people in the waiting area - these visitors may rotate.   Pre-op nurse will coordinate an appropriate time for 1 ADULT support person, who may not rotate, to accompany patient in pre-op.  Children under the age of 35 must have an adult with them who is not the patient and must remain in the main waiting area with an adult.  If the patient  needs to stay at the hospital during part of their recovery, the visitor guidelines for inpatient rooms apply.  Please refer to the [facility] website for the visitor guidelines for any additional information.   If you received a COVID test during your pre-op visit  it is requested that you wear a mask when out in public, stay away from anyone that may not be feeling well and notify your surgeon if you develop symptoms. If you have been in contact with anyone that has tested positive in the last 10 days please notify you surgeon.      Pre-operative 5 CHG Bathing Instructions   You can play a key role in reducing the risk of infection after surgery. Your skin needs to be as free of germs as possible. You can reduce the number of germs on your skin by washing with CHG (chlorhexidine  gluconate) soap before surgery. CHG is an antiseptic soap that kills germs and continues to kill germs even after washing.   DO NOT use if you have an allergy to chlorhexidine /CHG or antibacterial soaps. If your skin becomes reddened or irritated, stop using the CHG and notify one of our RNs at [phone].   Please shower with the CHG soap starting 4 days before surgery using the following schedule:     Please keep in mind the following:  DO NOT shave, including legs and underarms, starting the day of your first shower.   Place clean sheets on your bed the  day you start using CHG soap. Use a clean washcloth (not used since being washed) for each shower. DO NOT sleep with pets once you start using the CHG.   CHG Shower Instructions:  Wash your face and private area with normal soap. If you choose to wash your hair, wash first with your normal shampoo.  After you use shampoo/soap, rinse your hair and body thoroughly to remove shampoo/soap residue.  Turn the water  OFF and apply about 3 tablespoons (45 ml) of CHG soap to a CLEAN washcloth.  Apply CHG soap ONLY FROM YOUR NECK DOWN TO YOUR TOES (washing for 3-5  minutes)  DO NOT use CHG soap on face, private areas, open wounds, or sores.  Pay special attention to the area where your surgery is being performed.  If you are having back surgery, having someone wash your back for you may be helpful. Wait 2 minutes after CHG soap is applied, then you may rinse off the CHG soap.  Pat dry with a clean towel  Put on clean clothes/pajamas   If you choose to wear lotion, please use ONLY the CHG-compatible lotions that are listed below.  Additional instructions for the day of surgery: DO NOT APPLY any lotions, deodorants or cologne.   Do not bring valuables to the hospital. [facility] is not responsible for any belongings/valuables. Do not wear nail polish, gel polish, artificial nails, or any other type of covering on natural nails (fingers and toes) Do not wear jewelry or makeup Put on clean/comfortable clothes.  Please brush your teeth.  Ask your nurse before applying any prescription medications to the skin.     CHG Compatible Lotions   Aveeno Moisturizing lotion  Cetaphil Moisturizing Cream  Cetaphil Moisturizing Lotion  Clairol Herbal Essence Moisturizing Lotion, Dry Skin  Clairol Herbal Essence Moisturizing Lotion, Extra Dry Skin  Clairol Herbal Essence Moisturizing Lotion, Normal Skin  Curel Age Defying Therapeutic Moisturizing Lotion with Alpha Hydroxy  Curel Extreme Care Body Lotion  Curel Soothing Hands Moisturizing Hand Lotion  Curel Therapeutic Moisturizing Cream, Fragrance-Free  Curel Therapeutic Moisturizing Lotion, Fragrance-Free  Curel Therapeutic Moisturizing Lotion, Original Formula  Eucerin Daily Replenishing Lotion  Eucerin Dry Skin Therapy Plus Alpha Hydroxy Crme  Eucerin Dry Skin Therapy Plus Alpha Hydroxy Lotion  Eucerin Original Crme  Eucerin Original Lotion  Eucerin Plus Crme Eucerin Plus Lotion  Eucerin TriLipid Replenishing Lotion  Keri Anti-Bacterial Hand Lotion  Keri Deep Conditioning Original Lotion Dry Skin  Formula Softly Scented  Keri Deep Conditioning Original Lotion, Fragrance Free Sensitive Skin Formula  Keri Lotion Fast Absorbing Fragrance Free Sensitive Skin Formula  Keri Lotion Fast Absorbing Softly Scented Dry Skin Formula  Keri Original Lotion  Keri Skin Renewal Lotion Keri Silky Smooth Lotion  Keri Silky Smooth Sensitive Skin Lotion  Nivea Body Creamy Conditioning Oil  Nivea Body Extra Enriched Lotion  Nivea Body Original Lotion  Nivea Body Sheer Moisturizing Lotion Nivea Crme  Nivea Skin Firming Lotion  NutraDerm 30 Skin Lotion  NutraDerm Skin Lotion  NutraDerm Therapeutic Skin Cream  NutraDerm Therapeutic Skin Lotion  ProShield Protective Hand Cream  Provon moisturizing lotion  Please read over the following fact sheets that you were given.

## 2023-11-01 ENCOUNTER — Encounter (HOSPITAL_COMMUNITY)
Admission: RE | Admit: 2023-11-01 | Discharge: 2023-11-01 | Disposition: A | Discharge status: Home / Self Care | Source: Ambulatory Visit | Attending: Neurological Surgery | Admitting: Neurological Surgery

## 2023-11-01 ENCOUNTER — Other Ambulatory Visit: Payer: Self-pay

## 2023-11-01 ENCOUNTER — Encounter (HOSPITAL_COMMUNITY): Payer: Self-pay

## 2023-11-01 VITALS — BP 126/90 | HR 102 | Temp 98.8°F | Resp 17 | Ht 67.0 in | Wt 193.6 lb

## 2023-11-01 DIAGNOSIS — K760 Fatty (change of) liver, not elsewhere classified: Secondary | ICD-10-CM | POA: Diagnosis not present

## 2023-11-01 DIAGNOSIS — Z01812 Encounter for preprocedural laboratory examination: Secondary | ICD-10-CM | POA: Insufficient documentation

## 2023-11-01 DIAGNOSIS — E039 Hypothyroidism, unspecified: Secondary | ICD-10-CM | POA: Diagnosis not present

## 2023-11-01 DIAGNOSIS — Z79899 Other long term (current) drug therapy: Secondary | ICD-10-CM | POA: Insufficient documentation

## 2023-11-01 DIAGNOSIS — Z7984 Long term (current) use of oral hypoglycemic drugs: Secondary | ICD-10-CM | POA: Diagnosis not present

## 2023-11-01 DIAGNOSIS — M5412 Radiculopathy, cervical region: Secondary | ICD-10-CM | POA: Diagnosis not present

## 2023-11-01 DIAGNOSIS — I471 Supraventricular tachycardia, unspecified: Secondary | ICD-10-CM | POA: Diagnosis not present

## 2023-11-01 DIAGNOSIS — Z01818 Encounter for other preprocedural examination: Secondary | ICD-10-CM

## 2023-11-01 DIAGNOSIS — J42 Unspecified chronic bronchitis: Secondary | ICD-10-CM | POA: Diagnosis not present

## 2023-11-01 DIAGNOSIS — E785 Hyperlipidemia, unspecified: Secondary | ICD-10-CM | POA: Diagnosis not present

## 2023-11-01 DIAGNOSIS — K219 Gastro-esophageal reflux disease without esophagitis: Secondary | ICD-10-CM | POA: Diagnosis not present

## 2023-11-01 HISTORY — DX: Fatty (change of) liver, not elsewhere classified: K76.0

## 2023-11-01 HISTORY — DX: Anxiety disorder, unspecified: F41.9

## 2023-11-01 HISTORY — DX: Hyperlipidemia, unspecified: E78.5

## 2023-11-01 HISTORY — DX: Cardiac arrhythmia, unspecified: I49.9

## 2023-11-01 LAB — CBC
HCT: 45 % (ref 36.0–46.0)
Hemoglobin: 15 g/dL (ref 12.0–15.0)
MCH: 30.4 pg (ref 26.0–34.0)
MCHC: 33.3 g/dL (ref 30.0–36.0)
MCV: 91.1 fL (ref 80.0–100.0)
Platelets: 319 K/uL (ref 150–400)
RBC: 4.94 MIL/uL (ref 3.87–5.11)
RDW: 13.9 % (ref 11.5–15.5)
WBC: 8.5 K/uL (ref 4.0–10.5)
nRBC: 0 % (ref 0.0–0.2)

## 2023-11-01 LAB — SURGICAL PCR SCREEN
MRSA, PCR: NEGATIVE
Staphylococcus aureus: NEGATIVE

## 2023-11-01 LAB — COMPREHENSIVE METABOLIC PANEL WITH GFR
ALT: 31 U/L (ref 0–44)
AST: 30 U/L (ref 15–41)
Albumin: 3.6 g/dL (ref 3.5–5.0)
Alkaline Phosphatase: 35 U/L — ABNORMAL LOW (ref 38–126)
Anion gap: 11 (ref 5–15)
BUN: 17 mg/dL (ref 8–23)
CO2: 27 mmol/L (ref 22–32)
Calcium: 9.1 mg/dL (ref 8.9–10.3)
Chloride: 102 mmol/L (ref 98–111)
Creatinine, Ser: 0.85 mg/dL (ref 0.44–1.00)
GFR, Estimated: >60 mL/min (ref >60)
Glucose, Bld: 107 mg/dL — ABNORMAL HIGH (ref 70–99)
Potassium: 3.9 mmol/L (ref 3.5–5.1)
Sodium: 140 mmol/L (ref 135–145)
Total Bilirubin: 0.7 mg/dL (ref 0.0–1.2)
Total Protein: 6.5 g/dL (ref 6.5–8.1)

## 2023-11-01 LAB — TYPE AND SCREEN
ABO/RH(D): A NEG
Antibody Screen: NEGATIVE

## 2023-11-01 NOTE — Telephone Encounter (Signed)
Left message for vanessa to call

## 2023-11-01 NOTE — Progress Notes (Addendum)
 PCP - Kip Righter, MD  Cardiologist -  EP: Dr. Inocencio   PPM/ICD - Loop recorder Device Orders - n/a Rep Notified - n/a  Chest x-ray - 04-17-23 EKG - 04-17-23 Stress Test - denies ECHO - 01-23-22 Cardiac Cath - denies  Sleep Study - denies CPAP - n/a  Dm -denies  Blood Thinner Instructions:denies Aspirin Instructions:denies  ERAS Protcol - NPO   COVID TEST- n/a   Anesthesia review: Yes, Hx of SVT, hypothyroidism. Per patient she recently had a fall at home reports  left leg became weak and went down. Per patient no injuries   Patient denies shortness of breath, fever, cough and chest pain at PAT appointment   All instructions explained to the patient, with a verbal understanding of the material. Patient agrees to go over the instructions while at home for a better understanding. Patient also instructed to self quarantine after being tested for COVID-19. The opportunity to ask questions was provided.

## 2023-11-02 NOTE — Progress Notes (Signed)
 Anesthesia Chart Review:  Case: [id number] Date/Time: 11/10/23 1038   Procedure: ANTERIOR CERVICAL DECOMPRESSION/DISCECTOMY FUSION 3 LEVELS - ACDF - C4-C5 - C5-C6 - C6-C7   Anesthesia type: General   Diagnosis: Radiculopathy, cervical region [M54.12]   Pre-op diagnosis: Radiculopathy, cervical region   [facility] ROOM [facility] / [facility]   Surgeons: Joshua Alm Hamilton, MD       DISCUSSION: Patient is a 73 year old female scheduled for the above procedure.  History includes never smoker, chronic bronchitis, dysrhythmia/SVT (ILR 08/24/2022), hypothyroidism, GERD, HLD, fatty liver, anxiety, arthritis, cholecystectomy, appendectomy, sigmoid diverticulitis (s/p XI robotic assisted lower anterior rectosigmoid resection 05/30/2021), hearing aids.  Last visit with EP was on 05/07/23 with Leverne Fuller, PA-C.  Monitor in 2022 done for palpitation showed PACs, PVCs, no arrhythmias.  Reevaluated in October 2023 for ongoing palpitations with associated lightheadedness and near syncope.  TTE showed LVEF 60 to 65%, no wall motion abnormalities, she underwent loop recorder implant on 08/24/2022 by Dr. Inocencio.  She was noted to have brief runs of SVT.  Toprol  recommended.  She was doing great, no palpitations at last visit. Using Lopressor  as needed.   Last tachycardia episode then was in November with 8 seconds of PAT.  PVC burden 2.9%.  47-month follow-up planned.    Per [employer] ILF interrogation 09/10/23-10/10/23: Battery status okay.  Normal device function.  No new symptom, brady, or episodes.  No new AF episodes.  1 tachycardia event, 15 seconds in duration, HR 214, NCT. PVC 1.9%. Reviewed by Dr. Inocencio. No new changes. Programmed parameters appropriate.  Anesthesia team to evaluate on the day of surgery.  VS: BP (!) 126/90   Pulse (!) 102   Temp 37.1 C   Resp 17   Ht 5' 7 (1.702 m)   Wt 87.8 kg   SpO2 95%   BMI 30.32 kg/m   PROVIDERS: Kip Righter, MD is PCP  Inocencio Chi, MD is EP (ILR) Onita Duos, MD is neurologist   LABS: Labs reviewed: Acceptable for surgery. (all labs ordered are listed, but only abnormal results are displayed)  Labs Reviewed  COMPREHENSIVE METABOLIC PANEL WITH GFR - Abnormal; Notable for the following components:      Result Value   Glucose, Bld 107 (*)    Alkaline Phosphatase 35 (*)    All other components within normal limits  SURGICAL PCR SCREEN  CBC  TYPE AND SCREEN     IMAGES: MRI Brain 08/28/2023: IMPRESSION: Mild age-related change without acute intracranial pathology.   MRI C-spine 07/23/2023: IMPRESSION: 1. No acute osseous abnormality in the cervical spine. 2. Chronic cervical spine degeneration with no significant spinal stenosis. Moderate to severe neural foraminal stenosis at the right C5 and left C6 nerve levels is in part related to bulky facet spurring.  EKG: 11/27/2022: NSR   CV: Echo 01/23/2022: IMPRESSIONS   1. Left ventricular ejection fraction, by estimation, is 60 to 65%. The  left ventricle has normal function. The left ventricle has no regional  wall motion abnormalities. Left ventricular diastolic parameters are  consistent with Grade I diastolic  dysfunction (impaired relaxation). The average left ventricular global  longitudinal strain is -22.9 %. The global longitudinal strain is normal.   2. Right ventricular systolic function is normal. The right ventricular  size is normal. There is normal pulmonary artery systolic pressure.   3. The mitral valve is normal in structure. Trivial mitral valve  regurgitation. No evidence of mitral stenosis.   4. The aortic valve is  normal in structure. Aortic valve regurgitation is  mild. No aortic stenosis is present.   5. The inferior vena cava is normal in size with greater than 50%  respiratory variability, suggesting right atrial pressure of 3 mmHg.    Long term monitor 08/22/2020 - 09/02/2020:  Normal sinus rhythm with rare PACs and PVCs.   Single brief, run of PACs  lasting just a few seconds. No associated symptoms.   No atrial fibrillation.   Patient had symptoms with sinus tachycardia, HR 105.   No sustained pathologic arrhythmias.   Abdominal CT from 06/2020 showed aortic atherosclerosis.  Statin is recommended.  If she is willing, can start rosuvastatin  10 mg daily with lipids and liver in 3 months.  F/u in one year.   Past Medical History:  Diagnosis Date   Anxiety    Arthritis    Chronic bronchitis (HCC)    Depression    Dysrhythmia    Fatty liver    GERD (gastroesophageal reflux disease)    HLD (hyperlipidemia)    Hypothyroidism    Wears hearing aid    both ears    Past Surgical History:  Procedure Laterality Date   APPENDECTOMY     CHOLECYSTECTOMY  03/16/1980   DILATION AND CURETTAGE OF UTERUS  03/16/2001   fibroids   ECTOPIC PREGNANCY SURGERY  1979x2   on rt   ECTOPIC PREGNANCY SURGERY  03/16/1978   x2 left   EXPLORATORY LAPAROTOMY  10/15/1975   for fertility   HERNIA REPAIR  03/16/1993   lt LA   KNEE ARTHROSCOPY Left 07/12/2012   Procedure: LEFT KNEE ARTHROSCOPY ;  Surgeon: Maude KANDICE Herald, MD;  Location: [facility];  Service: Orthopedics;  Laterality: Left;  Left knee arthroscopy with partial medial menisectomy and chondroplasty   LAPAROSCOPIC ABDOMINAL EXPLORATION  03/16/1976   repair fall tube scarring fertility   LOOP RECORDER IMPLANT     REPLACEMENT UNICONDYLAR JOINT KNEE Right 2010   REPLACEMENT UNICONDYLAR JOINT KNEE Left 2008   SHOULDER ARTHROSCOPY  03/17/2003   left   XI ROBOTIC ASSISTED LOWER ANTERIOR RESECTION N/A 05/30/2021   Procedure: XI ROBOTIC ASSISTED LOWER ANTERIOR RESECTION OF COLON RECTOSIGMOID, INTRAOPERATIVE ASSESSMENT OF PERFUSION USING FIREFLY, TAP BLOCK, AND RIGID PROCTOSCOPY;  Surgeon: Sheldon Standing, MD;  Location: [facility];  Service: General;  Laterality: N/A;    MEDICATIONS:  amitriptyline  (ELAVIL ) 10 MG tablet   b complex vitamins capsule   benzonatate  (TESSALON ) 100 MG  capsule   buPROPion ER (WELLBUTRIN SR) 100 MG 12 hr tablet   dorzolamide -timolol  (COSOPT ) 22.3-6.8 MG/ML ophthalmic solution   Guaifenesin  1200 MG TB12   ibuprofen  (ADVIL ) 200 MG tablet   lansoprazole (PREVACID) 15 MG capsule   levothyroxine  (SYNTHROID ) 75 MCG tablet   lidocaine  (LIDODERM ) 5 %   metFORMIN (GLUCOPHAGE-XR) 500 MG 24 hr tablet   metoprolol  tartrate (LOPRESSOR ) 25 MG tablet   Multiple Vitamin (MULTIVITAMIN WITH MINERALS) TABS tablet   PARoxetine  (PAXIL ) 20 MG tablet   rosuvastatin  (CRESTOR ) 10 MG tablet   zolpidem  (AMBIEN ) 5 MG tablet   No current facility-administered medications for this encounter.    Isaiah Ruder, PA-C Surgical Short Stay/Anesthesiology [facility] Phone [phone] [facility] Phone [phone] 11/02/2023 7:24 PM

## 2023-11-02 NOTE — Anesthesia Preprocedure Evaluation (Addendum)
 Anesthesia Evaluation  Patient identified by MRN, date of birth, ID band Patient awake    Reviewed: Allergy & Precautions, NPO status , Patient's Chart, lab work & pertinent test results, reviewed documented beta blocker date and time   History of Anesthesia Complications Negative for: history of anesthetic complications  Airway Mallampati: II  TM Distance: >3 FB Neck ROM: Full    Dental  (+) Dental Advisory Given   Pulmonary neg pulmonary ROS   breath sounds clear to auscultation       Cardiovascular hypertension, Pt. on medications and Pt. on home beta blockers (-) angina + dysrhythmias (loop recorder)  Rhythm:Regular Rate:Normal  '23 ECHO: EF 60 to 65%.  1. The LV has normal function, no regional wall motion abnormalities. Grade I diastolic  dysfunction (impaired relaxation). The average left ventricular global longitudinal strain is -22.9 %. The global longitudinal strain is normal.   2. RVF is normal. The right ventricular size is normal. There is normal pulmonary artery systolic pressure.   3. The mitral valve is normal in structure. Trivial mitral valve regurgitation. No evidence of mitral stenosis.   4. The aortic valve is normal in structure. Aortic valve regurgitation is mild. No aortic stenosis is present.     Neuro/Psych   Anxiety Depression       GI/Hepatic Neg liver ROS,GERD  Medicated and Controlled,,  Endo/Other  diabetes (pre), Oral Hypoglycemic AgentsHypothyroidism  BMI 30  Renal/GU negative Renal ROS     Musculoskeletal   Abdominal   Peds  Hematology Hb 15, plt 319k   Anesthesia Other Findings   Reproductive/Obstetrics                              Anesthesia Physical Anesthesia Plan  ASA: 3  Anesthesia Plan: General   Post-op Pain Management: Tylenol  PO (pre-op)*   Induction: Intravenous  PONV Risk Score and Plan: 3 and Ondansetron , Dexamethasone  and Treatment may  vary due to age or medical condition  Airway Management Planned: Oral ETT and Video Laryngoscope Planned  Additional Equipment: None  Intra-op Plan:   Post-operative Plan: Extubation in OR  Informed Consent: I have reviewed the patients History and Physical, chart, labs and discussed the procedure including the risks, benefits and alternatives for the proposed anesthesia with the patient or authorized representative who has indicated his/her understanding and acceptance.     Dental advisory given  Plan Discussed with: CRNA and Surgeon  Anesthesia Plan Comments: (PAT note written 11/02/2023 by Allison Zelenak, PA-C.  )         Anesthesia Quick Evaluation

## 2023-11-10 ENCOUNTER — Inpatient Hospital Stay (HOSPITAL_COMMUNITY)
Admission: RE | Disposition: A | Discharge status: Home Health | Payer: Self-pay | Source: Home / Self Care | Attending: Neurological Surgery

## 2023-11-10 ENCOUNTER — Inpatient Hospital Stay (HOSPITAL_COMMUNITY)
Admission: RE | Admit: 2023-11-10 | Discharge: 2023-11-11 | DRG: 473 | Disposition: A | Discharge status: Home Health | Attending: Neurological Surgery | Admitting: Neurological Surgery

## 2023-11-10 ENCOUNTER — Inpatient Hospital Stay (HOSPITAL_COMMUNITY): Payer: Self-pay | Admitting: Vascular Surgery

## 2023-11-10 ENCOUNTER — Inpatient Hospital Stay (HOSPITAL_COMMUNITY)

## 2023-11-10 ENCOUNTER — Encounter (HOSPITAL_COMMUNITY): Payer: Self-pay | Admitting: Neurological Surgery

## 2023-11-10 ENCOUNTER — Other Ambulatory Visit: Payer: Self-pay

## 2023-11-10 DIAGNOSIS — Z79899 Other long term (current) drug therapy: Secondary | ICD-10-CM | POA: Diagnosis not present

## 2023-11-10 DIAGNOSIS — M4802 Spinal stenosis, cervical region: Secondary | ICD-10-CM

## 2023-11-10 DIAGNOSIS — E785 Hyperlipidemia, unspecified: Secondary | ICD-10-CM | POA: Diagnosis present

## 2023-11-10 DIAGNOSIS — M50123 Cervical disc disorder at C6-C7 level with radiculopathy: Secondary | ICD-10-CM | POA: Diagnosis present

## 2023-11-10 DIAGNOSIS — Z8249 Family history of ischemic heart disease and other diseases of the circulatory system: Secondary | ICD-10-CM | POA: Diagnosis not present

## 2023-11-10 DIAGNOSIS — K760 Fatty (change of) liver, not elsewhere classified: Secondary | ICD-10-CM | POA: Diagnosis present

## 2023-11-10 DIAGNOSIS — M4722 Other spondylosis with radiculopathy, cervical region: Secondary | ICD-10-CM | POA: Diagnosis not present

## 2023-11-10 DIAGNOSIS — K219 Gastro-esophageal reflux disease without esophagitis: Secondary | ICD-10-CM | POA: Diagnosis not present

## 2023-11-10 DIAGNOSIS — M2578 Osteophyte, vertebrae: Secondary | ICD-10-CM | POA: Diagnosis not present

## 2023-11-10 DIAGNOSIS — E119 Type 2 diabetes mellitus without complications: Secondary | ICD-10-CM | POA: Diagnosis not present

## 2023-11-10 DIAGNOSIS — R55 Syncope and collapse: Secondary | ICD-10-CM | POA: Diagnosis not present

## 2023-11-10 DIAGNOSIS — M532X2 Spinal instabilities, cervical region: Secondary | ICD-10-CM | POA: Diagnosis present

## 2023-11-10 DIAGNOSIS — E039 Hypothyroidism, unspecified: Secondary | ICD-10-CM | POA: Diagnosis not present

## 2023-11-10 DIAGNOSIS — M50122 Cervical disc disorder at C5-C6 level with radiculopathy: Secondary | ICD-10-CM | POA: Diagnosis present

## 2023-11-10 DIAGNOSIS — I10 Essential (primary) hypertension: Secondary | ICD-10-CM | POA: Diagnosis present

## 2023-11-10 DIAGNOSIS — Z7984 Long term (current) use of oral hypoglycemic drugs: Secondary | ICD-10-CM

## 2023-11-10 DIAGNOSIS — F32A Depression, unspecified: Secondary | ICD-10-CM | POA: Diagnosis present

## 2023-11-10 DIAGNOSIS — M5412 Radiculopathy, cervical region: Secondary | ICD-10-CM | POA: Diagnosis present

## 2023-11-10 DIAGNOSIS — Z981 Arthrodesis status: Secondary | ICD-10-CM | POA: Diagnosis not present

## 2023-11-10 DIAGNOSIS — R7303 Prediabetes: Secondary | ICD-10-CM | POA: Diagnosis not present

## 2023-11-10 DIAGNOSIS — Z79890 Hormone replacement therapy (postmenopausal): Secondary | ICD-10-CM

## 2023-11-10 DIAGNOSIS — Z9049 Acquired absence of other specified parts of digestive tract: Secondary | ICD-10-CM | POA: Diagnosis not present

## 2023-11-10 HISTORY — DX: Essential (primary) hypertension: I10

## 2023-11-10 HISTORY — PX: ANTERIOR CERVICAL DECOMP/DISCECTOMY FUSION: SHX1161

## 2023-11-10 SURGERY — ANTERIOR CERVICAL DECOMPRESSION/DISCECTOMY FUSION 3 LEVELS
Anesthesia: General

## 2023-11-10 MED ORDER — SENNA 8.6 MG PO TABS
1.0000 | ORAL_TABLET | Freq: Two times a day (BID) | ORAL | Status: DC
  Administered 2023-11-10 – 2023-11-11 (×3): 8.6 mg via ORAL
  Filled 2023-11-10 (×3): qty 1

## 2023-11-10 MED ORDER — ONDANSETRON HCL 4 MG/2ML IJ SOLN
INTRAMUSCULAR | Status: DC | PRN
  Administered 2023-11-10: 4 mg via INTRAVENOUS

## 2023-11-10 MED ORDER — MIDAZOLAM HCL 2 MG/2ML IJ SOLN
INTRAMUSCULAR | Status: AC
  Filled 2023-11-10: qty 2

## 2023-11-10 MED ORDER — ROCURONIUM BROMIDE 10 MG/ML (PF) SYRINGE
PREFILLED_SYRINGE | INTRAVENOUS | Status: DC | PRN
Start: 2023-11-10 — End: 2023-11-10
  Administered 2023-11-10: 60 mg via INTRAVENOUS
  Administered 2023-11-10 (×2): 20 mg via INTRAVENOUS

## 2023-11-10 MED ORDER — AMITRIPTYLINE HCL 10 MG PO TABS
10.0000 mg | ORAL_TABLET | Freq: Every day | ORAL | Status: DC
  Administered 2023-11-10: 10 mg via ORAL
  Filled 2023-11-10 (×2): qty 1

## 2023-11-10 MED ORDER — ROCURONIUM BROMIDE 10 MG/ML (PF) SYRINGE
PREFILLED_SYRINGE | INTRAVENOUS | Status: AC
  Filled 2023-11-10: qty 10

## 2023-11-10 MED ORDER — PAROXETINE HCL 20 MG PO TABS
20.0000 mg | ORAL_TABLET | ORAL | Status: DC
  Administered 2023-11-11: 20 mg via ORAL
  Filled 2023-11-10: qty 1

## 2023-11-10 MED ORDER — ONDANSETRON HCL 4 MG/2ML IJ SOLN
4.0000 mg | Freq: Four times a day (QID) | INTRAMUSCULAR | Status: DC | PRN
  Administered 2023-11-10: 4 mg via INTRAVENOUS

## 2023-11-10 MED ORDER — FENTANYL CITRATE (PF) 250 MCG/5ML IJ SOLN
INTRAMUSCULAR | Status: AC
  Filled 2023-11-10: qty 5

## 2023-11-10 MED ORDER — FENTANYL CITRATE (PF) 250 MCG/5ML IJ SOLN
INTRAMUSCULAR | Status: DC | PRN
  Administered 2023-11-10: 50 ug via INTRAVENOUS
  Administered 2023-11-10: 100 ug via INTRAVENOUS

## 2023-11-10 MED ORDER — OXYCODONE HCL 5 MG PO TABS
5.0000 mg | ORAL_TABLET | Freq: Once | ORAL | Status: AC | PRN
  Administered 2023-11-10: 5 mg via ORAL

## 2023-11-10 MED ORDER — DIPHENHYDRAMINE HCL 50 MG/ML IJ SOLN
INTRAMUSCULAR | Status: AC
  Filled 2023-11-10: qty 1

## 2023-11-10 MED ORDER — ORAL CARE MOUTH RINSE: 15.0000 mL | Freq: Once | OROMUCOSAL | Status: AC

## 2023-11-10 MED ORDER — MENTHOL 3 MG MT LOZG: 1.0000 | LOZENGE | OROMUCOSAL | Status: DC | PRN

## 2023-11-10 MED ORDER — ACETAMINOPHEN 325 MG PO TABS: 650.0000 mg | ORAL_TABLET | ORAL | Status: DC | PRN

## 2023-11-10 MED ORDER — PHENYLEPHRINE 80 MCG/ML (10ML) SYRINGE FOR IV PUSH (FOR BLOOD PRESSURE SUPPORT)
PREFILLED_SYRINGE | INTRAVENOUS | Status: AC
  Filled 2023-11-10: qty 10

## 2023-11-10 MED ORDER — 0.9 % SODIUM CHLORIDE (POUR BTL) OPTIME
TOPICAL | Status: DC | PRN
  Administered 2023-11-10: 1000 mL

## 2023-11-10 MED ORDER — ACETAMINOPHEN 500 MG PO TABS
1000.0000 mg | ORAL_TABLET | Freq: Once | ORAL | Status: AC
  Administered 2023-11-10: 1000 mg via ORAL
  Filled 2023-11-10: qty 2

## 2023-11-10 MED ORDER — CHLORHEXIDINE GLUCONATE CLOTH 2 % EX PADS: 6.0000 | MEDICATED_PAD | Freq: Once | CUTANEOUS | Status: DC

## 2023-11-10 MED ORDER — DORZOLAMIDE HCL-TIMOLOL MAL 2-0.5 % OP SOLN: 1.0000 [drp] | Freq: Two times a day (BID) | OPHTHALMIC | Status: DC

## 2023-11-10 MED ORDER — PROPOFOL 10 MG/ML IV BOLUS
INTRAVENOUS | Status: DC | PRN
Start: 2023-11-10 — End: 2023-11-10
  Administered 2023-11-10: 120 mg via INTRAVENOUS

## 2023-11-10 MED ORDER — DORZOLAMIDE HCL 2 % OP SOLN
1.0000 [drp] | Freq: Two times a day (BID) | OPHTHALMIC | Status: DC
  Administered 2023-11-10: 1 [drp] via OPHTHALMIC
  Filled 2023-11-10: qty 10

## 2023-11-10 MED ORDER — THROMBIN 5000 UNITS EX KIT
PACK | CUTANEOUS | Status: AC
  Filled 2023-11-10: qty 1

## 2023-11-10 MED ORDER — ACETAMINOPHEN 500 MG PO TABS: 1000.0000 mg | ORAL_TABLET | ORAL | Status: AC

## 2023-11-10 MED ORDER — MEPERIDINE HCL 25 MG/ML IJ SOLN: 6.2500 mg | INTRAMUSCULAR | Status: DC | PRN

## 2023-11-10 MED ORDER — LIDOCAINE 2% (20 MG/ML) 5 ML SYRINGE
INTRAMUSCULAR | Status: DC | PRN
  Administered 2023-11-10: 100 mg via INTRAVENOUS

## 2023-11-10 MED ORDER — LEVOTHYROXINE SODIUM 75 MCG PO TABS
75.0000 ug | ORAL_TABLET | Freq: Every morning | ORAL | Status: DC
  Administered 2023-11-11: 75 ug via ORAL
  Filled 2023-11-10: qty 1

## 2023-11-10 MED ORDER — PROPOFOL 10 MG/ML IV BOLUS
INTRAVENOUS | Status: AC
  Filled 2023-11-10: qty 20

## 2023-11-10 MED ORDER — DEXAMETHASONE SODIUM PHOSPHATE 4 MG/ML IJ SOLN: 4.0000 mg | Freq: Four times a day (QID) | INTRAMUSCULAR | Status: DC

## 2023-11-10 MED ORDER — POTASSIUM CHLORIDE IN NACL 20-0.9 MEQ/L-% IV SOLN
INTRAVENOUS | Status: DC
  Filled 2023-11-10: qty 1000

## 2023-11-10 MED ORDER — PHENOL 1.4 % MT LIQD: 1.0000 | OROMUCOSAL | Status: DC | PRN

## 2023-11-10 MED ORDER — ONDANSETRON HCL 4 MG PO TABS: 4.0000 mg | ORAL_TABLET | Freq: Four times a day (QID) | ORAL | Status: DC | PRN

## 2023-11-10 MED ORDER — SODIUM CHLORIDE 0.9 % IV SOLN
250.0000 mL | INTRAVENOUS | Status: DC
  Administered 2023-11-10: 250 mL via INTRAVENOUS

## 2023-11-10 MED ORDER — SODIUM CHLORIDE 0.9% FLUSH
3.0000 mL | Freq: Two times a day (BID) | INTRAVENOUS | Status: DC
  Administered 2023-11-10 (×2): 3 mL via INTRAVENOUS

## 2023-11-10 MED ORDER — MIDAZOLAM HCL 2 MG/2ML IJ SOLN: 0.5000 mg | Freq: Once | INTRAMUSCULAR | Status: DC | PRN

## 2023-11-10 MED ORDER — DEXAMETHASONE 4 MG PO TABS
4.0000 mg | ORAL_TABLET | Freq: Four times a day (QID) | ORAL | Status: DC
  Administered 2023-11-10 – 2023-11-11 (×3): 4 mg via ORAL
  Filled 2023-11-10 (×3): qty 1

## 2023-11-10 MED ORDER — THROMBIN 5000 UNITS EX SOLR
OROMUCOSAL | Status: DC | PRN
  Administered 2023-11-10 (×2): 5 mL via TOPICAL

## 2023-11-10 MED ORDER — CEFAZOLIN SODIUM-DEXTROSE 2-4 GM/100ML-% IV SOLN
2.0000 g | Freq: Three times a day (TID) | INTRAVENOUS | Status: AC
  Administered 2023-11-10 (×2): 2 g via INTRAVENOUS
  Filled 2023-11-10 (×2): qty 100

## 2023-11-10 MED ORDER — SODIUM CHLORIDE 0.9% FLUSH: 3.0000 mL | INTRAVENOUS | Status: DC | PRN

## 2023-11-10 MED ORDER — ACETAMINOPHEN 650 MG RE SUPP: 650.0000 mg | RECTAL | Status: DC | PRN

## 2023-11-10 MED ORDER — GABAPENTIN 300 MG PO CAPS
300.0000 mg | ORAL_CAPSULE | ORAL | Status: AC
  Administered 2023-11-10: 300 mg via ORAL
  Filled 2023-11-10: qty 1

## 2023-11-10 MED ORDER — PHENYLEPHRINE HCL-NACL 20-0.9 MG/250ML-% IV SOLN
INTRAVENOUS | Status: DC | PRN
  Administered 2023-11-10: 30 ug/min via INTRAVENOUS

## 2023-11-10 MED ORDER — ZOLPIDEM TARTRATE 5 MG PO TABS
5.0000 mg | ORAL_TABLET | Freq: Every evening | ORAL | Status: DC | PRN
  Administered 2023-11-10: 5 mg via ORAL
  Filled 2023-11-10: qty 1

## 2023-11-10 MED ORDER — PHENYLEPHRINE 80 MCG/ML (10ML) SYRINGE FOR IV PUSH (FOR BLOOD PRESSURE SUPPORT)
PREFILLED_SYRINGE | INTRAVENOUS | Status: DC | PRN
  Administered 2023-11-10: 80 ug via INTRAVENOUS

## 2023-11-10 MED ORDER — LIDOCAINE 2% (20 MG/ML) 5 ML SYRINGE
INTRAMUSCULAR | Status: AC
  Filled 2023-11-10: qty 5

## 2023-11-10 MED ORDER — OXYCODONE HCL 5 MG PO TABS
ORAL_TABLET | ORAL | Status: AC
  Filled 2023-11-10: qty 1

## 2023-11-10 MED ORDER — DEXAMETHASONE SODIUM PHOSPHATE 10 MG/ML IJ SOLN
INTRAMUSCULAR | Status: AC
  Filled 2023-11-10: qty 1

## 2023-11-10 MED ORDER — ONDANSETRON HCL 4 MG/2ML IJ SOLN
INTRAMUSCULAR | Status: AC
  Filled 2023-11-10: qty 2

## 2023-11-10 MED ORDER — TIMOLOL MALEATE 0.5 % OP SOLN
1.0000 [drp] | Freq: Two times a day (BID) | OPHTHALMIC | Status: DC
  Administered 2023-11-10: 1 [drp] via OPHTHALMIC
  Filled 2023-11-10 (×2): qty 5

## 2023-11-10 MED ORDER — PANTOPRAZOLE SODIUM 20 MG PO TBEC
20.0000 mg | DELAYED_RELEASE_TABLET | Freq: Every day | ORAL | Status: DC
  Administered 2023-11-11: 20 mg via ORAL
  Filled 2023-11-10: qty 1

## 2023-11-10 MED ORDER — METHOCARBAMOL 500 MG PO TABS
500.0000 mg | ORAL_TABLET | Freq: Four times a day (QID) | ORAL | Status: DC | PRN
  Administered 2023-11-10 – 2023-11-11 (×2): 500 mg via ORAL
  Filled 2023-11-10 (×2): qty 1

## 2023-11-10 MED ORDER — HYDROMORPHONE HCL 1 MG/ML IJ SOLN
0.2500 mg | INTRAMUSCULAR | Status: DC | PRN
  Administered 2023-11-10: 0.5 mg via INTRAVENOUS

## 2023-11-10 MED ORDER — OXYCODONE HCL 5 MG/5ML PO SOLN: 5.0000 mg | Freq: Once | ORAL | Status: AC | PRN

## 2023-11-10 MED ORDER — METHOCARBAMOL 1000 MG/10ML IJ SOLN
500.0000 mg | Freq: Four times a day (QID) | INTRAMUSCULAR | Status: DC | PRN
Start: 2023-11-10 — End: 2023-11-11

## 2023-11-10 MED ORDER — METFORMIN HCL ER 500 MG PO TB24
500.0000 mg | ORAL_TABLET | Freq: Every day | ORAL | Status: DC
  Administered 2023-11-10: 500 mg via ORAL
  Filled 2023-11-10: qty 1

## 2023-11-10 MED ORDER — BUPIVACAINE HCL (PF) 0.25 % IJ SOLN
INTRAMUSCULAR | Status: AC
  Filled 2023-11-10: qty 30

## 2023-11-10 MED ORDER — HYDROCODONE-ACETAMINOPHEN 10-325 MG PO TABS
1.0000 | ORAL_TABLET | ORAL | Status: DC | PRN
  Administered 2023-11-10 – 2023-11-11 (×4): 1 via ORAL
  Filled 2023-11-10 (×4): qty 1

## 2023-11-10 MED ORDER — MORPHINE SULFATE (PF) 2 MG/ML IV SOLN: 2.0000 mg | INTRAVENOUS | Status: DC | PRN

## 2023-11-10 MED ORDER — SUGAMMADEX SODIUM 200 MG/2ML IV SOLN
INTRAVENOUS | Status: DC | PRN
  Administered 2023-11-10: 400 mg via INTRAVENOUS

## 2023-11-10 MED ORDER — CHLORHEXIDINE GLUCONATE 0.12 % MT SOLN
15.0000 mL | Freq: Once | OROMUCOSAL | Status: AC
  Administered 2023-11-10: 15 mL via OROMUCOSAL
  Filled 2023-11-10: qty 15

## 2023-11-10 MED ORDER — BUPIVACAINE HCL (PF) 0.25 % IJ SOLN
INTRAMUSCULAR | Status: DC | PRN
  Administered 2023-11-10: 5 mL

## 2023-11-10 MED ORDER — BUPROPION HCL ER (SR) 100 MG PO TB12
100.0000 mg | ORAL_TABLET | Freq: Every morning | ORAL | Status: DC
  Administered 2023-11-11: 100 mg via ORAL
  Filled 2023-11-10: qty 1

## 2023-11-10 MED ORDER — ALBUTEROL SULFATE HFA 108 (90 BASE) MCG/ACT IN AERS
INHALATION_SPRAY | RESPIRATORY_TRACT | Status: AC
  Filled 2023-11-10: qty 6.7

## 2023-11-10 MED ORDER — ALBUTEROL SULFATE HFA 108 (90 BASE) MCG/ACT IN AERS
INHALATION_SPRAY | RESPIRATORY_TRACT | Status: DC | PRN
  Administered 2023-11-10: 2 via RESPIRATORY_TRACT

## 2023-11-10 MED ORDER — MIDAZOLAM HCL 2 MG/2ML IJ SOLN
INTRAMUSCULAR | Status: DC | PRN
Start: 2023-11-10 — End: 2023-11-10
  Administered 2023-11-10: 2 mg via INTRAVENOUS

## 2023-11-10 MED ORDER — DEXAMETHASONE SODIUM PHOSPHATE 10 MG/ML IJ SOLN
INTRAMUSCULAR | Status: DC | PRN
Start: 2023-11-10 — End: 2023-11-10
  Administered 2023-11-10: 10 mg via INTRAVENOUS

## 2023-11-10 MED ORDER — SODIUM CHLORIDE 0.9 % IV SOLN: INTRAVENOUS | Status: DC | PRN

## 2023-11-10 MED ORDER — DIPHENHYDRAMINE HCL 50 MG/ML IJ SOLN
INTRAMUSCULAR | Status: DC | PRN
  Administered 2023-11-10: 12.5 mg via INTRAVENOUS

## 2023-11-10 MED ORDER — LACTATED RINGERS IV SOLN: INTRAVENOUS | Status: DC

## 2023-11-10 MED ORDER — CEFAZOLIN SODIUM-DEXTROSE 2-4 GM/100ML-% IV SOLN
2.0000 g | INTRAVENOUS | Status: AC
  Administered 2023-11-10: 2 g via INTRAVENOUS
  Filled 2023-11-10: qty 100

## 2023-11-10 MED ORDER — HYDROMORPHONE HCL 1 MG/ML IJ SOLN
INTRAMUSCULAR | Status: AC
  Filled 2023-11-10: qty 1

## 2023-11-10 SURGICAL SUPPLY — 46 items
BAG COUNTER SPONGE SURGICOUNT (BAG) ×1 IMPLANT
BAND RUBBER #18 3X1/16 STRL (MISCELLANEOUS) ×2 IMPLANT
BASKET BONE COLLECTION (BASKET) ×1 IMPLANT
BENZOIN TINCTURE PRP APPL 2/3 (GAUZE/BANDAGES/DRESSINGS) ×1 IMPLANT
BIT DRILL 2.3 STRL 12 (BIT) IMPLANT
BUR CARBIDE MATCH 3.0 (BURR) ×1 IMPLANT
CANISTER SUCTION 3000ML PPV (SUCTIONS) ×1 IMPLANT
DRAIN 7X20 FLAT PERF LF SIL ST (DRAIN) IMPLANT
DRAPE C-ARM 42X72 X-RAY (DRAPES) ×2 IMPLANT
DRAPE LAPAROTOMY 100X72 PEDS (DRAPES) ×1 IMPLANT
DRAPE MICROSCOPE SLANT 54X150 (MISCELLANEOUS) IMPLANT
DRSG OPSITE POSTOP 4X6 (GAUZE/BANDAGES/DRESSINGS) IMPLANT
DRSG OPSITE POSTOP 4X8 (GAUZE/BANDAGES/DRESSINGS) IMPLANT
DURAPREP 6ML APPLICATOR 50/CS (WOUND CARE) ×1 IMPLANT
ELECT COATED BLADE 2.86 ST (ELECTRODE) ×1 IMPLANT
ELECTRODE REM PT RTRN 9FT ADLT (ELECTROSURGICAL) ×1 IMPLANT
EVACUATOR SILICONE 100CC (DRAIN) IMPLANT
GAUZE 4X4 16PLY ~~LOC~~+RFID DBL (SPONGE) IMPLANT
GLOVE BIO SURGEON STRL SZ7 (GLOVE) ×1 IMPLANT
GLOVE BIO SURGEON STRL SZ8 (GLOVE) ×1 IMPLANT
GLOVE BIOGEL PI IND STRL 7.0 (GLOVE) ×1 IMPLANT
GOWN STRL REUS W/ TWL LRG LVL3 (GOWN DISPOSABLE) ×1 IMPLANT
GOWN STRL REUS W/ TWL XL LVL3 (GOWN DISPOSABLE) ×1 IMPLANT
GOWN STRL REUS W/TWL 2XL LVL3 (GOWN DISPOSABLE) IMPLANT
HEMOSTAT POWDER KIT SURGIFOAM (HEMOSTASIS) ×1 IMPLANT
KIT BASIN OR (CUSTOM PROCEDURE TRAY) ×1 IMPLANT
KIT TURNOVER KIT B (KITS) ×1 IMPLANT
NDL HYPO 25X1 1.5 SAFETY (NEEDLE) ×1 IMPLANT
NDL SPNL 20GX3.5 QUINCKE YW (NEEDLE) ×1 IMPLANT
NEEDLE HYPO 25X1 1.5 SAFETY (NEEDLE) ×1 IMPLANT
NEEDLE SPNL 20GX3.5 QUINCKE YW (NEEDLE) ×1 IMPLANT
NS IRRIG 1000ML POUR BTL (IV SOLUTION) ×1 IMPLANT
PACK LAMINECTOMY NEURO (CUSTOM PROCEDURE TRAY) ×1 IMPLANT
PAD ARMBOARD POSITIONER FOAM (MISCELLANEOUS) ×1 IMPLANT
PIN DISTRACTION 14MM (PIN) ×2 IMPLANT
PLATE INSIG 58 3L (Plate) IMPLANT
SCREW VA SINGLE LEAD 4X14 ST (Screw) IMPLANT
SPACER ASSEM CERV LORD 7M (Spacer) IMPLANT
SPONGE INTESTINAL PEANUT (DISPOSABLE) ×1 IMPLANT
SPONGE SURGIFOAM ABS GEL 100 (HEMOSTASIS) IMPLANT
STRIP CLOSURE SKIN 1/2X4 (GAUZE/BANDAGES/DRESSINGS) ×1 IMPLANT
SUT VIC AB 3-0 SH 8-18 (SUTURE) ×1 IMPLANT
SUT VIC AB 4-0 PS2 18 (SUTURE) IMPLANT
TOWEL GREEN STERILE (TOWEL DISPOSABLE) ×1 IMPLANT
TOWEL GREEN STERILE FF (TOWEL DISPOSABLE) ×1 IMPLANT
WATER STERILE IRR 1000ML POUR (IV SOLUTION) ×1 IMPLANT

## 2023-11-10 NOTE — Op Note (Signed)
 11/10/2023  1:50 PM  PATIENT:  Amy Glenn  73 y.o. 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 Decompressive anterior cervical discectomy C4-5 C5-6 C6-7, 2. Anterior cervical arthrodesis C4-5 C5-6 C6-7 utilizing a cortical cancellous allograft structural bone graft, 3. Anterior cervical plating C4-7 utilizing a ATEC plate  SURGEON:  Alm Molt, MD  ASSISTANTS: Suzen Pean, FNP  ANESTHESIA:   General  EBL: 50 ml  Total I/O In: 1300 [I.V.:1200; IV Piggyback:100] Out: -   BLOOD ADMINISTERED: none  DRAINS: 7 flat JP  SPECIMEN:  none  INDICATION FOR PROCEDURE: This patient presented with neck pain and left arm pain. Imaging showed subluxation with instability C4-5, foraminal stenosis C5-6 left, and significant spondylosis with foraminal stenosis C6-7. The patient tried conservative measures without relief. Pain was debilitating. Recommended ACDF with plating. Patient understood the risks, benefits, and alternatives and potential outcomes and wished to proceed.  PROCEDURE DETAILS: Patient was brought to the operating room placed under general endotracheal anesthesia. Patient was placed in the supine position on the operating room table. The neck was prepped with Duraprep and draped in a sterile fashion.   Three cc of local anesthesia was injected and a transverse incision was made on the right side of the neck.  Dissection was carried down thru the subcutaneous tissue and the platysma was  elevated, opened, and undermined with Metzenbaum scissors.  Dissection was then carried out thru an avascular plane leaving the sternocleidomastoid carotid artery and jugular vein laterally and the trachea and esophagus medially with the assistance of my nurse practitioner. The ventral aspect of the vertebral column was identified and a localizing  x-ray was taken. The C5-6 level was identified and all in the room agreed with the level. The longus colli muscles were then elevated and the retractor was placed with the assistance of my nurse practitioner. The annulus of C4-5 C5-6 and C6-7 was incised and the disc space entered. Discectomy was performed with micro-curettes and pituitary rongeurs. I then used the high-speed drill to drill the endplates down to the level of the posterior longitudinal ligament.  The operating microscope was draped and brought into the field provided additional magnification, illumination and visualization. Discectomy was continued posteriorly thru the disc space. Posterior longitudinal ligament was opened with a nerve hook, and then removed along with disc herniation and osteophytes, decompressing the spinal canal and thecal sac. We then continued to remove osteophytic overgrowth and disc material decompressing the neural foramina and exiting nerve roots bilaterally. The scope was angled up and down to help decompress and undercut the vertebral bodies. Once the decompression was completed we could pass a nerve hook circumferentially to assure adequate decompression in the midline and in the neural foramina. So by both visualization and palpation we felt we had an adequate decompression of the neural elements. We then measured the height of the intravertebral disc space and selected a 7 millimeter structural allograft for each level. It was then gently positioned in the intravertebral disc space(s) and countersunk. I then used a ATEC plate and placed variable angle screws into the vertebral bodies of each level and locked them into position. The wound was irrigated with bacitracin solution, checked for hemostasis which was established and confirmed. Once meticulous hemostasis was achieved, placed a 7 flat JP drain through a separate stab incision, we then proceeded with closure with the assistance of my nurse practitioner.  The  platysma was closed with interrupted 3-0 undyed Vicryl suture, the subcuticular layer was closed with interrupted 3-0 undyed Vicryl suture. The skin edges were approximated with steristrips. The drapes were removed. A sterile dressing was applied. The patient was then awakened from general anesthesia and transferred to the recovery room in stable condition. At the end of the procedure all sponge, needle and instrument counts were correct.   PLAN OF CARE: Admit to inpatient   PATIENT DISPOSITION:  PACU - hemodynamically stable.   Delay start of Pharmacological VTE agent (>24hrs) due to surgical blood loss or risk of bleeding:  yes

## 2023-11-10 NOTE — Transfer of Care (Signed)
 Immediate Anesthesia Transfer of Care Note  Patient: Amy Glenn  Procedure(s) Performed: ANTERIOR CERVICAL DECOMPRESSION/DISCECTOMY FUSION CERVICAL FOUR-FIVE, CERVICAL FIVE-SIX, CERVICAL SIX-SEVEN  Patient Location: PACU  Anesthesia Type:General  Level of Consciousness: oriented, drowsy, and patient cooperative  Airway & Oxygen Therapy: Patient Spontanous Breathing and Patient connected to face mask oxygen  Post-op Assessment: Report given to RN, Post -op Vital signs reviewed and stable, Patient moving all extremities X 4, and Patient able to stick tongue midline  Post vital signs: Reviewed and stable  Last Vitals:  Vitals Value Taken Time  BP 145/94 11/10/23 13:47  Temp 98.8   Pulse 98 11/10/23 13:49  Resp 15 11/10/23 13:49  SpO2 98 % 11/10/23 13:49  Vitals shown include unfiled device data.  Last Pain:  Vitals:   11/10/23 0911  PainSc: 0-No pain      Patients Stated Pain Goal: 0 (11/10/23 0855)  Complications: No notable events documented.

## 2023-11-10 NOTE — Anesthesia Procedure Notes (Signed)
 Procedure Name: Intubation Date/Time: 11/10/2023 11:20 AM  Performed by: Viviana Almarie DASEN, CRNAPre-anesthesia Checklist: Patient identified, Emergency Drugs available, Suction available and Patient being monitored Patient Re-evaluated:Patient Re-evaluated prior to induction Oxygen Delivery Method: Circle System Utilized Preoxygenation: Pre-oxygenation with 100% oxygen Induction Type: IV induction Ventilation: Mask ventilation without difficulty Tube type: Oral Number of attempts: 1 Airway Equipment and Method: Stylet, Oral airway and Bite block Placement Confirmation: ETT inserted through vocal cords under direct vision, positive ETCO2 and breath sounds checked- equal and bilateral Secured at: 21 cm Tube secured with: Tape Dental Injury: Teeth and Oropharynx as per pre-operative assessment

## 2023-11-10 NOTE — Plan of Care (Signed)

## 2023-11-10 NOTE — H&P (Signed)
 Subjective:   Patient is a 73 y.o. female admitted for neck pain with radiculopathy. The patient first presented to me with complaints of neck pain and shooting pains in the arm(s). Onset of symptoms was several months ago. The pain is described as aching and occurs all day. The pain is rated severe, and is located in the neck and radiates to the arms. The symptoms have been progressive. Symptoms are exacerbated by extending head backwards, and are relieved by none.  Previous work up includes MRI of cervical spine, results: spinal stenosis.  Flexion-extension plain films show dynamic instability at C4-5.  Past Medical History:  Diagnosis Date   Anxiety    Arthritis    Chronic bronchitis (HCC)    Depression    Dysrhythmia    Fatty liver    GERD (gastroesophageal reflux disease)    HLD (hyperlipidemia)    Hypertension    Hypothyroidism    Wears hearing aid    both ears    Past Surgical History:  Procedure Laterality Date   APPENDECTOMY     CHOLECYSTECTOMY  03/16/1980   DILATION AND CURETTAGE OF UTERUS  03/16/2001   fibroids   ECTOPIC PREGNANCY SURGERY  1979x2   on rt   ECTOPIC PREGNANCY SURGERY  03/16/1978   x2 left   EXPLORATORY LAPAROTOMY  10/15/1975   for fertility   HERNIA REPAIR  03/16/1993   lt LA   KNEE ARTHROSCOPY Left 07/12/2012   Procedure: LEFT KNEE ARTHROSCOPY ;  Surgeon: Maude KANDICE Herald, MD;  Location: [facility];  Service: Orthopedics;  Laterality: Left;  Left knee arthroscopy with partial medial menisectomy and chondroplasty   LAPAROSCOPIC ABDOMINAL EXPLORATION  03/16/1976   repair fall tube scarring fertility   LOOP RECORDER IMPLANT     REPLACEMENT UNICONDYLAR JOINT KNEE Right 2010   REPLACEMENT UNICONDYLAR JOINT KNEE Left 2008   SHOULDER ARTHROSCOPY  03/17/2003   left   XI ROBOTIC ASSISTED LOWER ANTERIOR RESECTION N/A 05/30/2021   Procedure: XI ROBOTIC ASSISTED LOWER ANTERIOR RESECTION OF COLON RECTOSIGMOID, INTRAOPERATIVE ASSESSMENT OF  PERFUSION USING FIREFLY, TAP BLOCK, AND RIGID PROCTOSCOPY;  Surgeon: Sheldon Standing, MD;  Location: [facility];  Service: General;  Laterality: N/A;    No Known Allergies  Social History   Tobacco Use   Smoking status: Never   Smokeless tobacco: Never  Substance Use Topics   Alcohol use: Yes    Comment: occ    Family History  Problem Relation Age of Onset   Hypertension Sister    Prior to Admission medications   Medication Sig Start Date End Date Taking? Authorizing Provider  amitriptyline  (ELAVIL ) 10 MG tablet Take 10 mg by mouth at bedtime.   Yes [provider]  b complex vitamins capsule Take 1 capsule by mouth daily.   Yes [provider]  buPROPion  ER (WELLBUTRIN  SR) 100 MG 12 hr tablet Take 100 mg by mouth every morning.   Yes [provider]  dorzolamide -timolol  (COSOPT ) 22.3-6.8 MG/ML ophthalmic solution Place 1 drop into both eyes 2 (two) times daily. 01/11/20  Yes [provider]  lansoprazole (PREVACID) 15 MG capsule Take 15 mg by mouth daily.   Yes [provider]  levothyroxine  (SYNTHROID ) 75 MCG tablet Take 75 mcg by mouth every morning. 11/25/19  Yes [provider]  metFORMIN  (GLUCOPHAGE -XR) 500 MG 24 hr tablet Take 500 mg by mouth daily with supper.   Yes [provider]  Multiple Vitamin (MULTIVITAMIN WITH MINERALS) TABS tablet Take 1 tablet by  mouth daily.   Yes [provider]  PARoxetine  (PAXIL ) 20 MG tablet Take 20 mg by mouth every morning.   Yes [provider]  rosuvastatin  (CRESTOR ) 10 MG tablet TAKE 1 TABLET BY MOUTH AT BEDTIME 12/30/22  Yes Dann Candyce RAMAN, MD  zolpidem  (AMBIEN ) 5 MG tablet Take 5 mg by mouth at bedtime as needed for sleep. 09/13/23  Yes [provider]  benzonatate  (TESSALON ) 100 MG capsule Take 1 capsule (100 mg total) by mouth every 8 (eight) hours. Patient not taking: Reported on 10/28/2023 04/16/23   Randol Simmonds, MD  Guaifenesin  1200 MG TB12 Take 1  tablet (1,200 mg total) by mouth 2 (two) times daily at 10 AM and 5 PM. Patient not taking: Reported on 10/28/2023 04/16/23   Randol Simmonds, MD  ibuprofen  (ADVIL ) 200 MG tablet Take 3 tablets (600 mg total) by mouth every 6 (six) hours as needed (pain). Patient not taking: Reported on 10/28/2023 04/18/23   Bryn Bernardino NOVAK, MD  lidocaine  (LIDODERM ) 5 % Place 1 patch onto the skin daily. Remove & Discard patch within 12 hours or as directed by MD Patient not taking: Reported on 10/28/2023 03/03/23   Victor Lynwood DASEN, PA-C  metoprolol  tartrate (LOPRESSOR ) 25 MG tablet Take 1 tablet (25 mg total) by mouth every 8 (eight) hours as needed (PALPITATIONS .SABRA). Patient not taking: Reported on 10/28/2023 03/01/23   Leverne Charlies Helling, PA-C     Review of Systems  Positive ROS: neg  All other systems have been reviewed and were otherwise negative with the exception of those mentioned in the HPI and as above.  Objective: Vital signs in last 24 hours: Temp:  [98 F (36.7 C)] 98 F (36.7 C) (08/27 0848) Pulse Rate:  [93] 93 (08/27 0848) Resp:  [18] 18 (08/27 0848) BP: (162)/(88) 162/88 (08/27 0848) SpO2:  [96 %] 96 % (08/27 0848) Weight:  [86.2 kg] 86.2 kg (08/27 0848)  General Appearance: Alert, cooperative, no distress, appears stated age Head: Normocephalic, without obvious abnormality, atraumatic Eyes: PERRL, conjunctiva/corneas clear, EOM's intact      Neck: Supple, symmetrical, trachea midline, Back: Symmetric, no curvature, ROM normal, no CVA tenderness Lungs:  respirations unlabored Heart: Regular rate and rhythm Abdomen: Soft, non-tender Extremities: Extremities normal, atraumatic, no cyanosis or edema Pulses: 2+ and symmetric all extremities Skin: Skin color, texture, turgor normal, no rashes or lesions  NEUROLOGIC:  Mental status: Alert and oriented x4, no aphasia, good attention span, fund of knowledge and memory  Motor Exam - grossly normal Sensory Exam - grossly normal Reflexes:  1+ Coordination - grossly normal Gait - grossly normal Balance - grossly normal Cranial Nerves: I: smell Not tested  II: visual acuity  OS: nl    OD: nl  II: visual fields Full to confrontation  II: pupils Equal, round, reactive to light  III,VII: ptosis None  III,IV,VI: extraocular muscles  Full ROM  V: mastication Normal  V: facial light touch sensation  Normal  V,VII: corneal reflex  Present  VII: facial muscle function - upper  Normal  VII: facial muscle function - lower Normal  VIII: hearing Not tested  IX: soft palate elevation  Normal  IX,X: gag reflex Present  XI: trapezius strength  5/5  XI: sternocleidomastoid strength 5/5  XI: neck flexion strength  5/5  XII: tongue strength  Normal    Data Review Lab Results  Component Value Date   WBC 8.5 11/01/2023   HGB 15.0 11/01/2023   HCT 45.0 11/01/2023  MCV 91.1 11/01/2023   PLT 319 11/01/2023   Lab Results  Component Value Date   NA 140 11/01/2023   K 3.9 11/01/2023   CL 102 11/01/2023   CO2 27 11/01/2023   BUN 17 11/01/2023   CREATININE 0.85 11/01/2023   GLUCOSE 107 (H) 11/01/2023   No results found for: INR, PROTIME  Assessment:   Cervical neck pain with herniated nucleus pulposus/ spondylosis/ stenosis at C4-5 C5-6 C6-7.  There is dynamic instability at C4-5 with subluxation through flexion and extension, foraminal stenosis at C5-6, and significant spondylosis at C6-7.  Estimated body mass index is 29.76 kg/m as calculated from the following:   Height as of this encounter: 5' 7 (1.702 m).   Weight as of this encounter: 86.2 kg.  Patient has failed conservative therapy. Planned surgery : ACDF with plating C4-5 C5-6 C6-7  Plan:   I explained the condition and procedure to the patient and answered any questions.  Patient wishes to proceed with procedure as planned. Understands risks/ benefits/ and expected or typical outcomes.  Alm GORMAN Molt 11/10/2023 10:34 AM

## 2023-11-10 NOTE — Anesthesia Postprocedure Evaluation (Signed)
 Anesthesia Post Note  Patient: Amy Glenn  Procedure(s) Performed: ANTERIOR CERVICAL DECOMPRESSION/DISCECTOMY FUSION CERVICAL FOUR-FIVE, CERVICAL FIVE-SIX, CERVICAL SIX-SEVEN     Patient location during evaluation: PACU Anesthesia Type: General Level of consciousness: awake and alert, oriented and patient cooperative Pain management: pain level controlled Vital Signs Assessment: post-procedure vital signs reviewed and stable Respiratory status: spontaneous breathing, nonlabored ventilation, respiratory function stable and patient connected to nasal cannula oxygen Cardiovascular status: blood pressure returned to baseline and stable Postop Assessment: no apparent nausea or vomiting Anesthetic complications: no   No notable events documented.  Last Vitals:  Vitals:   11/10/23 1430 11/10/23 1513  BP: (!) 149/104 (!) 122/93  Pulse: (!) 102 100  Resp: 16 16  Temp: 36.7 C 36.7 C  SpO2: 96% 94%    Last Pain:  Vitals:   11/10/23 1428  PainSc: 7     LLE Motor Response: Purposeful movement;Responds to commands (11/10/23 1513)   RLE Motor Response: Purposeful movement;Responds to commands (11/10/23 1513) RLE Sensation: Full sensation (11/10/23 1513)      Caylin Nass,E. Talmadge Ganas

## 2023-11-11 ENCOUNTER — Ambulatory Visit

## 2023-11-11 ENCOUNTER — Encounter (HOSPITAL_COMMUNITY): Payer: Self-pay | Admitting: Neurological Surgery

## 2023-11-11 DIAGNOSIS — R55 Syncope and collapse: Secondary | ICD-10-CM | POA: Diagnosis not present

## 2023-11-11 LAB — CUP PACEART REMOTE DEVICE CHECK
Date Time Interrogation Session: 20250827233634
Implantable Pulse Generator Implant Date: 20240601

## 2023-11-11 MED ORDER — METHOCARBAMOL 500 MG PO TABS: 500.0000 mg | ORAL_TABLET | Freq: Four times a day (QID) | ORAL | 1 refills | Status: DC | PRN

## 2023-11-11 MED ORDER — HYDROCODONE-ACETAMINOPHEN 10-325 MG PO TABS: 1.0000 | ORAL_TABLET | ORAL | 0 refills | Status: DC | PRN

## 2023-11-11 MED FILL — Thrombin For Soln 5000 Unit: CUTANEOUS | Qty: 5000 | Status: AC

## 2023-11-11 NOTE — Evaluation (Addendum)
 Physical Therapy Evaluation and DIscharge Patient Details Name: Amy Glenn MRN: [id number] DOB: December 03, 1950 Today's Date: 11/11/2023  History of Present Illness  Pt is a 73 y/o female presenting on 8/27 for ACDF C4-5, C5-6, C6-7. PMH includes: anxiety, arthritis, chronic bronchitis, HTN.  Clinical Impression  Patient evaluated by Physical Therapy with no further acute PT needs identified. All education has been completed and the patient has no further questions. Pt from home alone but has family and friends staying with her for awhile after surgery. She has had frequent falls (~1/wk) that she attributes to LLE weakness. Pt needed assist with SL to sit but it otherwise at supervision level with mobility. She ambulates with very short, shuffling steps. Worked on lengthening and slowing down her step pattern. This will hopefully resolve post op but if not, rec f/u with neurology for further testing. Pt instructed to use RW for all ambulation for at least the next 2 weeks due to her fall risk and recommend HHPT.  PT is signing off. Thank you for this referral.         If plan is discharge home, recommend the following: A little help with bathing/dressing/bathroom;Assist for transportation   Can travel by private vehicle        Equipment Recommendations Rolling walker (2 wheels)  Recommendations for Other Services       Functional Status Assessment Patient has had a recent decline in their functional status and demonstrates the ability to make significant improvements in function in a reasonable and predictable amount of time.     Precautions / Restrictions Precautions Precautions: Cervical Precaution Booklet Issued: Yes (comment) Recall of Precautions/Restrictions: Intact Precaution/Restrictions Comments: discussed proper posture and restrictions Restrictions Weight Bearing Restrictions Per Provider Order: No      Mobility  Bed Mobility Overal bed mobility: Needs  Assistance Bed Mobility: Rolling, Sidelying to Sit, Sit to Sidelying Rolling: Supervision Sidelying to sit: Min assist     Sit to sidelying: Contact guard assist General bed mobility comments: pt able to roll to L side and bring LE's off EOB but had difficulty transitioning from SL to sit, needed min to mod A for elevation of trunk. Was able to return to SL without physical assist.    Transfers Overall transfer level: Needs assistance Equipment used: Rolling walker (2 wheels) Transfers: Sit to/from Stand Sit to Stand: Supervision           General transfer comment: vc's for hand placement    Ambulation/Gait Ambulation/Gait assistance: Contact guard assist Gait Distance (Feet): 200 Feet Assistive device: Rolling walker (2 wheels) Gait Pattern/deviations: Shuffle, Decreased stride length Gait velocity: decreased Gait velocity interpretation: <1.8 ft/sec, indicate of risk for recurrent falls   General Gait Details: pt with very short, low steps which is even more extreme when she fatigues. Worked on taking longer, slower steps, esp with turning. Rec use of RW for all ambulation for at least the next 2 weeks due to her fall history.  Stairs            Wheelchair Mobility     Tilt Bed    Modified Rankin (Stroke Patients Only)       Balance Overall balance assessment: History of Falls, Needs assistance Sitting-balance support: Feet supported, No upper extremity supported Sitting balance-Leahy Scale: Good     Standing balance support: No upper extremity supported, During functional activity Standing balance-Leahy Scale: Fair Standing balance comment: able to manage clothes without holding to UE support for use of bathroom  Pertinent Vitals/Pain Pain Assessment Pain Assessment: Faces Faces Pain Scale: Hurts little more Pain Location: neck Pain Descriptors / Indicators: Operative site guarding, Sore Pain Intervention(s):  Limited activity within patient's tolerance, Monitored during session    Home Living Family/patient expects to be discharged to:: Private residence Living Arrangements: Alone Available Help at Discharge: Family;Available PRN/intermittently;Friend(s) Type of Home: House Home Access: Level entry       Home Layout: One level Home Equipment: Standard Walker;Cane - single point;Shower seat;Grab bars - tub/shower Additional Comments: son will stay with her tonight and then friend coming to stay for a few days    Prior Function Prior Level of Function : Independent/Modified Independent;Driving             Mobility Comments: used SPC out of home, usually no AD in home. Had almost weekly falls that she attributes to LLE weakness ADLs Comments: independent     Extremity/Trunk Assessment   Upper Extremity Assessment Upper Extremity Assessment: Defer to OT evaluation    Lower Extremity Assessment Lower Extremity Assessment: LLE deficits/detail;Generalized weakness LLE Deficits / Details: hip flex 3-/5, knee ext 3+/5 LLE Sensation: WNL LLE Coordination: decreased gross motor    Cervical / Trunk Assessment Cervical / Trunk Assessment: Neck Surgery  Communication   Communication Communication: Impaired Factors Affecting Communication: Hearing impaired (has hearing aides)    Cognition Arousal: Alert Behavior During Therapy: WFL for tasks assessed/performed   PT - Cognitive impairments: No apparent impairments                         Following commands: Intact       Cueing Cueing Techniques: Verbal cues     General Comments General comments (skin integrity, edema, etc.): education given on activity level upon return home, posture, and activity modifications. Pt has dog that she helps to get up on bed. Discussed lifting restrictions and that she cannot put dog on the bed. She will look into steps for the dog    Exercises     Assessment/Plan    PT Assessment  All further PT needs can be met in the next venue of care  PT Problem List Decreased strength;Decreased balance;Decreased mobility;Decreased activity tolerance;Decreased coordination;Decreased knowledge of precautions;Pain       PT Treatment Interventions      PT Goals (Current goals can be found in the Care Plan section)  Acute Rehab PT Goals Patient Stated Goal: return home PT Goal Formulation: All assessment and education complete, DC therapy    Frequency       Co-evaluation               AM-PAC PT 6 Clicks Mobility  Outcome Measure Help needed turning from your back to your side while in a flat bed without using bedrails?: None Help needed moving from lying on your back to sitting on the side of a flat bed without using bedrails?: A Little Help needed moving to and from a bed to a chair (including a wheelchair)?: A Little Help needed standing up from a chair using your arms (e.g., wheelchair or bedside chair)?: A Little Help needed to walk in hospital room?: A Little Help needed climbing 3-5 steps with a railing? : A Little 6 Click Score: 19    End of Session Equipment Utilized During Treatment: Gait belt Activity Tolerance: Patient tolerated treatment well Patient left: with call bell/phone within reach;Other (comment) (on toilet) Nurse Communication: Mobility status PT Visit Diagnosis: Unsteadiness on feet (  R26.81);History of falling (Z91.81);Muscle weakness (generalized) (M62.81);Pain Pain - part of body:  (neck)    Time: 9187-9151 PT Time Calculation (min) (ACUTE ONLY): 36 min   Charges:   PT Evaluation $PT Eval Moderate Complexity: 1 Mod PT Treatments $Gait Training: 8-22 mins PT General Charges $$ ACUTE PT VISIT: 1 Visit         Richerd Lipoma, PT  Acute Rehab Services Secure chat preferred Office [phone]   Richerd CROME Mayar Whittier 11/11/2023, 9:52 AM

## 2023-11-11 NOTE — Evaluation (Signed)
 Occupational Therapy Evaluation Patient Details Name: Amy Glenn MRN: [id number] DOB: 1950/10/25 Today's Date: 11/11/2023   History of Present Illness   Pt is a 73 y/o female presenting on 8/27 for ACDF C4-5, C5-6, C6-7. PMH includes: anxiety, arthritis, chronic bronchitis, HTN.     Clinical Impressions Patient admitted for above and presents with problem list below.  PTA pt was independent and driving. Patient was educated on cervical precautions, ADL compensatory techniques, AE/DME, mobility progression, safety and recommendations.  Today, pt demonstrated ability to complete bed mobility with supervision, transfers using RW with supervision, functional mobility using RW with supervision, and ADLs with modified independence.  Initially requires cueing to adhere to cervical precautions, pt self correcting as session progressed.  At discharge, pt will have support from family/friends initially and is agreeable to have assist with IADLs and tub transfers. Based on performance today, no further OT needs identified.  OT will sign off.       If plan is discharge home, recommend the following:   Assistance with cooking/housework;Assist for transportation     Functional Status Assessment   Patient has had a recent decline in their functional status and demonstrates the ability to make significant improvements in function in a reasonable and predictable amount of time.     Equipment Recommendations   None recommended by OT     Recommendations for Other Services         Precautions/Restrictions   Precautions Precautions: Cervical Precaution Booklet Issued: Yes (comment) Recall of Precautions/Restrictions: Intact Precaution/Restrictions Comments: discussed proper posture and restrictions Required Braces or Orthoses:  (no brace needed order) Restrictions Weight Bearing Restrictions Per Provider Order: No     Mobility Bed Mobility Overal bed mobility: Needs  Assistance Bed Mobility: Rolling, Sit to Sidelying Rolling: Supervision       Sit to sidelying: Supervision General bed mobility comments: able to manage back to bed using log roll technique without assist    Transfers Overall transfer level: Needs assistance Equipment used: Rolling walker (2 wheels) Transfers: Sit to/from Stand Sit to Stand: Supervision           General transfer comment: vc's for hand placement, posture      Balance Overall balance assessment: History of Falls, Needs assistance Sitting-balance support: Feet supported, No upper extremity supported Sitting balance-Leahy Scale: Good     Standing balance support: No upper extremity supported, During functional activity Standing balance-Leahy Scale: Fair Standing balance comment: ADLs without UE support but relies on RW dynamically                           ADL either performed or assessed with clinical judgement   ADL Overall ADL's : Modified independent                                       General ADL Comments: pt able to manage ADLs without assist after education of cervical precautions and compenstory techniques     Vision   Vision Assessment?: No apparent visual deficits     Perception         Praxis         Pertinent Vitals/Pain Pain Assessment Pain Assessment: Faces Faces Pain Scale: Hurts little more Pain Location: neck Pain Descriptors / Indicators: Operative site guarding, Sore Pain Intervention(s): Limited activity within patient's tolerance, Monitored during session, Repositioned  Extremity/Trunk Assessment Upper Extremity Assessment Upper Extremity Assessment: Generalized weakness (within cervical precautions.)   Lower Extremity Assessment Lower Extremity Assessment: Defer to PT evaluation LLE Deficits / Details: hip flex 3-/5, knee ext 3+/5 LLE Sensation: WNL LLE Coordination: decreased gross motor   Cervical / Trunk  Assessment Cervical / Trunk Assessment: Neck Surgery   Communication Communication Communication: Impaired Factors Affecting Communication: Hearing impaired (has hearing aides)   Cognition Arousal: Alert Behavior During Therapy: WFL for tasks assessed/performed Cognition: No apparent impairments                               Following commands: Intact       Cueing  General Comments   Cueing Techniques: Verbal cues  education given on activity level upon return home, posture, and activity modifications. Pt has dog that she helps to get up on bed. Discussed lifting restrictions and that she cannot put dog on the bed. She will look into steps for the dog   Exercises     Shoulder Instructions      Home Living Family/patient expects to be discharged to:: Private residence Living Arrangements: Alone Available Help at Discharge: Family;Available PRN/intermittently;Friend(s) Type of Home: House Home Access: Level entry     Home Layout: One level     Bathroom Shower/Tub: Chief Strategy Officer: Standard     Home Equipment: Standard Walker;Cane - single point;Shower seat;Grab bars - tub/shower   Additional Comments: son will stay with her tonight and then friend coming to stay for a few days      Prior Functioning/Environment Prior Level of Function : Independent/Modified Independent;Driving             Mobility Comments: used SPC out of home, usually no AD in home. Had almost weekly falls that she attributes to LLE weakness ADLs Comments: independent    OT Problem List: Decreased activity tolerance;Decreased knowledge of precautions;Decreased knowledge of use of DME or AE   OT Treatment/Interventions:        OT Goals(Current goals can be found in the care plan section)   Acute Rehab OT Goals Patient Stated Goal: home OT Goal Formulation: With patient Time For Goal Achievement: 11/25/23 Potential to Achieve Goals: Good   OT  Frequency:       Co-evaluation              AM-PAC OT 6 Clicks Daily Activity     Outcome Measure Help from another person eating meals?: None Help from another person taking care of personal grooming?: None Help from another person toileting, which includes using toliet, bedpan, or urinal?: None Help from another person bathing (including washing, rinsing, drying)?: None Help from another person to put on and taking off regular upper body clothing?: None Help from another person to put on and taking off regular lower body clothing?: None 6 Click Score: 24   End of Session Equipment Utilized During Treatment: Rolling walker (2 wheels) Nurse Communication: Mobility status  Activity Tolerance: Patient tolerated treatment well Patient left: with call bell/phone within reach;in bed  OT Visit Diagnosis: Other abnormalities of gait and mobility (R26.89);Pain Pain - part of body:  (neck- surgical)                Time: 9148-9090 OT Time Calculation (min): 18 min Charges:  OT General Charges $OT Visit: 1 Visit OT Evaluation $OT Eval Low Complexity: 1 Low  Kathlyn Leachman B, OT Acute  Rehabilitation Services Office [phone] Secure Chat Preferred    Etta GORMAN Hope 11/11/2023, 10:35 AM

## 2023-11-11 NOTE — Discharge Summary (Signed)
 Physician Discharge Summary  Patient ID: Amy Glenn MRN: [id number] DOB/AGE: 1950/07/01 73 y.o.  Admit date: 11/10/2023 Discharge date: 11/11/2023  Admission Diagnoses: cervical stenosis/ instability    Discharge Diagnoses: same   Discharged Condition: good  Hospital Course: The patient was admitted on 11/10/2023 and taken to the operating room where the patient underwent ACDF. The patient tolerated the procedure well and was taken to the recovery room and then to the floor in stable condition. The hospital course was routine. There were no complications. The wound remained clean dry and intact. Pt had appropriate neck soreness. No complaints of arm pain or new N/T/W. The patient remained afebrile with stable vital signs, and tolerated a regular diet. The patient continued to increase activities, and pain was well controlled with oral pain medications.   Consults: None  Significant Diagnostic Studies:  Results for orders placed or performed during the hospital encounter of 11/01/23  Surgical pcr screen   Collection Time: 11/01/23 10:11 AM   Specimen: Nasal Mucosa; Nasal Swab  Result Value Ref Range   MRSA, PCR NEGATIVE NEGATIVE   Staphylococcus aureus NEGATIVE NEGATIVE  Type and screen [facility]   Collection Time: 11/01/23 10:24 AM  Result Value Ref Range   ABO/RH(D) A NEG    Antibody Screen NEG    Sample Expiration 11/15/2023,2359    Extend sample reason      NO TRANSFUSIONS OR PREGNANCY IN THE PAST 3 MONTHS Performed at [facility], [address], [place], [place] [address]   Comprehensive metabolic panel per protocol   Collection Time: 11/01/23 10:30 AM  Result Value Ref Range   Sodium 140 135 - 145 mmol/L   Potassium 3.9 3.5 - 5.1 mmol/L   Chloride 102 98 - 111 mmol/L   CO2 27 22 - 32 mmol/L   Glucose, Bld 107 (H) 70 - 99 mg/dL   BUN 17 8 - 23 mg/dL   Creatinine, Ser 9.14 0.44 - 1.00 mg/dL   Calcium  9.1 8.9 - 10.3 mg/dL   Total  Protein 6.5 6.5 - 8.1 g/dL   Albumin 3.6 3.5 - 5.0 g/dL   AST 30 15 - 41 U/L   ALT 31 0 - 44 U/L   Alkaline Phosphatase 35 (L) 38 - 126 U/L   Total Bilirubin 0.7 0.0 - 1.2 mg/dL   GFR, Estimated >39 >39 mL/min   Anion gap 11 5 - 15  CBC per protocol   Collection Time: 11/01/23 10:30 AM  Result Value Ref Range   WBC 8.5 4.0 - 10.5 K/uL   RBC 4.94 3.87 - 5.11 MIL/uL   Hemoglobin 15.0 12.0 - 15.0 g/dL   HCT 54.9 63.9 - 53.9 %   MCV 91.1 80.0 - 100.0 fL   MCH 30.4 26.0 - 34.0 pg   MCHC 33.3 30.0 - 36.0 g/dL   RDW 86.0 88.4 - 84.4 %   Platelets 319 150 - 400 K/uL   nRBC 0.0 0.0 - 0.2 %    DG Cervical Spine 1 View Result Date: 11/10/2023 CLINICAL DATA:  Surgical fusion of C4-5, C5-6 and C6-7. EXAM: DG CERVICAL SPINE - 1 VIEW; DG C-ARM 1-60 MIN-NO REPORT Radiation exposure index: 0.6083 mGy. COMPARISON:  Jul 23, 2023. FINDINGS: Two intraoperative fluoroscopic images were obtained of the cervical spine. These demonstrate the patient be status post surgical anterior fusion of C4-5, C5-6 and C6-7. IMPRESSION: Fluoroscopic guidance provided during surgical anterior fusion of C4-5, C5-6 and C6-7. Electronically Signed   By:  Lynwood Landy Raddle M.D.   On: 11/10/2023 17:05   DG C-Arm 1-60 Min-No Report Result Date: 11/10/2023 Fluoroscopy was utilized by the requesting physician.  No radiographic interpretation.   DG C-Arm 1-60 Min-No Report Result Date: 11/10/2023 Fluoroscopy was utilized by the requesting physician.  No radiographic interpretation.    Antibiotics:  Anti-infectives (From admission, onward)    Start     Dose/Rate Route Frequency Ordered Stop   11/10/23 1530  ceFAZolin  (ANCEF ) IVPB 2g/100 mL premix        2 g 200 mL/hr over 30 Minutes Intravenous Every 8 hours 11/10/23 1516 11/10/23 2230   11/10/23 0900  ceFAZolin  (ANCEF ) IVPB 2g/100 mL premix        2 g 200 mL/hr over 30 Minutes Intravenous On call to O.R. 11/10/23 0851 11/10/23 1125       Discharge Exam: Blood pressure  125/77, pulse 96, temperature 98.4 F (36.9 C), temperature source Oral, resp. rate 18, height 5' 7 (1.702 m), weight 86.2 kg, SpO2 93%. Neurologic: Grossly normal Dressing dry  Discharge Medications:   Allergies as of 11/11/2023   No Known Allergies      Medication List     STOP taking these medications    benzonatate  100 MG capsule Commonly known as: TESSALON    Guaifenesin  1200 MG Tb12   ibuprofen  200 MG tablet Commonly known as: ADVIL    lidocaine  5 % Commonly known as: Lidoderm    metoprolol  tartrate 25 MG tablet Commonly known as: LOPRESSOR        TAKE these medications    amitriptyline  10 MG tablet Commonly known as: ELAVIL  Take 10 mg by mouth at bedtime.   b complex vitamins capsule Take 1 capsule by mouth daily.   buPROPion  ER 100 MG 12 hr tablet Commonly known as: WELLBUTRIN  SR Take 100 mg by mouth every morning.   dorzolamide -timolol  2-0.5 % ophthalmic solution Commonly known as: COSOPT  Place 1 drop into both eyes 2 (two) times daily.   HYDROcodone -acetaminophen  10-325 MG tablet Commonly known as: NORCO Take 1 tablet by mouth every 4 (four) hours as needed for moderate pain (pain score 4-6).   lansoprazole 15 MG capsule Commonly known as: PREVACID Take 15 mg by mouth daily.   levothyroxine  75 MCG tablet Commonly known as: SYNTHROID  Take 75 mcg by mouth every morning.   metFORMIN  500 MG 24 hr tablet Commonly known as: GLUCOPHAGE -XR Take 500 mg by mouth daily with supper.   methocarbamol  500 MG tablet Commonly known as: ROBAXIN  Take 1 tablet (500 mg total) by mouth every 6 (six) hours as needed for muscle spasms.   multivitamin with minerals Tabs tablet Take 1 tablet by mouth daily.   PARoxetine  20 MG tablet Commonly known as: PAXIL  Take 20 mg by mouth every morning.   rosuvastatin  10 MG tablet Commonly known as: CRESTOR  TAKE 1 TABLET BY MOUTH AT BEDTIME   zolpidem  5 MG tablet Commonly known as: AMBIEN  Take 5 mg by mouth at  bedtime as needed for sleep.        Disposition: home   Final Dx: ACDF C4-5 C5-6 C6-7  Discharge Instructions      Remove dressing in 72 hours   Complete by: As directed    Call MD for:  difficulty breathing, headache or visual disturbances   Complete by: As directed    Call MD for:  persistant nausea and vomiting   Complete by: As directed    Call MD for:  redness, tenderness, or signs of infection (pain,  swelling, redness, odor or green/yellow discharge around incision site)   Complete by: As directed    Call MD for:  severe uncontrolled pain   Complete by: As directed    Call MD for:  temperature >100.4   Complete by: As directed    Diet - low sodium heart healthy   Complete by: As directed    Increase activity slowly   Complete by: As directed           Signed: Alm GORMAN Molt 11/11/2023, 6:15 AM

## 2023-11-11 NOTE — Progress Notes (Signed)
 Patient alert and oriented, void, ambulate. Surgical site clean and dry no sign of infection. D/c instructions explain and given to the patient, all questions answered. Patient d/c home with RW per order.

## 2023-11-11 NOTE — TOC Transition Note (Signed)
 Transition of Care [facility]) - Discharge Note   Patient Details  Name: Amy Glenn MRN: [id number] Date of Birth: [birth year]  Transition of [facility]) CM/SW Contact:  Andrez JULIANNA George, RN Phone Number: 11/11/2023, 11:07 AM   Clinical Narrative:     Pt is discharging home with home health services through [facility]. Information on the AVS. [name] will contact her for the first home visit.  Pt has transportation home.   Final next level of care: Home w Home Health Services Barriers to Discharge: No Barriers Identified   Patient Goals and CMS Choice   CMS Medicare.gov Compare Post Acute Care list provided to:: Patient Choice offered to / list presented to : Patient      Discharge Placement                       Discharge Plan and Services Additional resources added to the After Visit Summary for                            [facility] Arranged: [facility] Agency: [facility] Date [facility] Agency Contacted: 11/11/23   Representative spoke with at [facility] Agency: Darleene  Social Drivers of Health (SDOH) Interventions SDOH Screenings   Food Insecurity: No Food Insecurity (04/18/2023)  Housing: Low Risk  (04/18/2023)  Transportation Needs: No Transportation Needs (04/18/2023)  Utilities: Not At Risk (04/18/2023)  Social Connections: Patient Declined (04/18/2023)  Tobacco Use: Low Risk  (11/10/2023)     Readmission Risk Interventions     No data to display

## 2023-11-13 ENCOUNTER — Other Ambulatory Visit: Payer: Self-pay

## 2023-11-13 ENCOUNTER — Emergency Department (HOSPITAL_COMMUNITY)

## 2023-11-13 ENCOUNTER — Encounter (HOSPITAL_COMMUNITY): Payer: Self-pay

## 2023-11-13 ENCOUNTER — Observation Stay (HOSPITAL_COMMUNITY)
Admission: EM | Admit: 2023-11-13 | Discharge: 2023-11-16 | Disposition: A | Discharge status: Home / Self Care | Attending: Neurological Surgery | Admitting: Neurological Surgery

## 2023-11-13 DIAGNOSIS — H538 Other visual disturbances: Secondary | ICD-10-CM | POA: Diagnosis not present

## 2023-11-13 DIAGNOSIS — R531 Weakness: Principal | ICD-10-CM

## 2023-11-13 DIAGNOSIS — E039 Hypothyroidism, unspecified: Secondary | ICD-10-CM | POA: Insufficient documentation

## 2023-11-13 DIAGNOSIS — I10 Essential (primary) hypertension: Secondary | ICD-10-CM | POA: Insufficient documentation

## 2023-11-13 DIAGNOSIS — R32 Unspecified urinary incontinence: Principal | ICD-10-CM | POA: Insufficient documentation

## 2023-11-13 DIAGNOSIS — M6281 Muscle weakness (generalized): Secondary | ICD-10-CM | POA: Diagnosis not present

## 2023-11-13 DIAGNOSIS — M2548 Effusion, other site: Secondary | ICD-10-CM | POA: Diagnosis not present

## 2023-11-13 DIAGNOSIS — Z79890 Hormone replacement therapy (postmenopausal): Secondary | ICD-10-CM | POA: Diagnosis not present

## 2023-11-13 DIAGNOSIS — Z79899 Other long term (current) drug therapy: Secondary | ICD-10-CM | POA: Diagnosis not present

## 2023-11-13 DIAGNOSIS — M5021 Other cervical disc displacement,  high cervical region: Secondary | ICD-10-CM | POA: Diagnosis not present

## 2023-11-13 DIAGNOSIS — G8918 Other acute postprocedural pain: Secondary | ICD-10-CM | POA: Diagnosis not present

## 2023-11-13 DIAGNOSIS — Z981 Arthrodesis status: Secondary | ICD-10-CM | POA: Diagnosis not present

## 2023-11-13 DIAGNOSIS — R202 Paresthesia of skin: Secondary | ICD-10-CM | POA: Diagnosis not present

## 2023-11-13 LAB — CBC
HCT: 42.1 % (ref 36.0–46.0)
Hemoglobin: 13.9 g/dL (ref 12.0–15.0)
MCH: 30.3 pg (ref 26.0–34.0)
MCHC: 33 g/dL (ref 30.0–36.0)
MCV: 91.9 fL (ref 80.0–100.0)
Platelets: 305 K/uL (ref 150–400)
RBC: 4.58 MIL/uL (ref 3.87–5.11)
RDW: 13.6 % (ref 11.5–15.5)
WBC: 9.4 K/uL (ref 4.0–10.5)
nRBC: 0 % (ref 0.0–0.2)

## 2023-11-13 LAB — BASIC METABOLIC PANEL WITH GFR
Anion gap: 10 (ref 5–15)
BUN: 12 mg/dL (ref 8–23)
CO2: 29 mmol/L (ref 22–32)
Calcium: 9 mg/dL (ref 8.9–10.3)
Chloride: 101 mmol/L (ref 98–111)
Creatinine, Ser: 0.74 mg/dL (ref 0.44–1.00)
GFR, Estimated: >60 mL/min (ref >60)
Glucose, Bld: 92 mg/dL (ref 70–99)
Potassium: 3.8 mmol/L (ref 3.5–5.1)
Sodium: 140 mmol/L (ref 135–145)

## 2023-11-13 MED ORDER — DEXAMETHASONE SODIUM PHOSPHATE 4 MG/ML IJ SOLN
4.0000 mg | Freq: Four times a day (QID) | INTRAMUSCULAR | Status: DC
  Administered 2023-11-13 – 2023-11-14 (×3): 4 mg via INTRAVENOUS
  Filled 2023-11-13 (×4): qty 1

## 2023-11-13 MED ORDER — METFORMIN HCL ER 500 MG PO TB24
500.0000 mg | ORAL_TABLET | Freq: Every day | ORAL | Status: DC
  Administered 2023-11-13 – 2023-11-15 (×3): 500 mg via ORAL
  Filled 2023-11-13 (×4): qty 1

## 2023-11-13 MED ORDER — B COMPLEX VITAMINS PO CAPS: 1.0000 | ORAL_CAPSULE | Freq: Every day | ORAL | Status: DC

## 2023-11-13 MED ORDER — PAROXETINE HCL 20 MG PO TABS
20.0000 mg | ORAL_TABLET | ORAL | Status: DC
  Administered 2023-11-13 – 2023-11-16 (×4): 20 mg via ORAL
  Filled 2023-11-13 (×5): qty 1

## 2023-11-13 MED ORDER — PANTOPRAZOLE SODIUM 20 MG PO TBEC
20.0000 mg | DELAYED_RELEASE_TABLET | Freq: Every day | ORAL | Status: DC
  Administered 2023-11-13 – 2023-11-16 (×4): 20 mg via ORAL
  Filled 2023-11-13 (×4): qty 1

## 2023-11-13 MED ORDER — HYDROCODONE-ACETAMINOPHEN 5-325 MG PO TABS
1.0000 | ORAL_TABLET | Freq: Once | ORAL | Status: AC
  Administered 2023-11-13: 1 via ORAL
  Filled 2023-11-13: qty 1

## 2023-11-13 MED ORDER — METOPROLOL TARTRATE 5 MG/5ML IV SOLN: 5.0000 mg | Freq: Three times a day (TID) | INTRAVENOUS | Status: DC | PRN

## 2023-11-13 MED ORDER — HYDROCODONE-ACETAMINOPHEN 10-325 MG PO TABS
1.0000 | ORAL_TABLET | ORAL | Status: DC | PRN
  Administered 2023-11-13 – 2023-11-14 (×3): 1 via ORAL
  Filled 2023-11-13 (×4): qty 1

## 2023-11-13 MED ORDER — SODIUM CHLORIDE 0.9 % IV BOLUS
500.0000 mL | Freq: Once | INTRAVENOUS | Status: AC
  Administered 2023-11-13: 500 mL via INTRAVENOUS

## 2023-11-13 MED ORDER — BUPROPION HCL ER (SR) 100 MG PO TB12
100.0000 mg | ORAL_TABLET | Freq: Every morning | ORAL | Status: DC
  Administered 2023-11-13 – 2023-11-16 (×4): 100 mg via ORAL
  Filled 2023-11-13 (×5): qty 1

## 2023-11-13 MED ORDER — GADOBUTROL 1 MMOL/ML IV SOLN
8.0000 mL | Freq: Once | INTRAVENOUS | Status: AC | PRN
  Administered 2023-11-13: 8 mL via INTRAVENOUS

## 2023-11-13 MED ORDER — DEXAMETHASONE SODIUM PHOSPHATE 10 MG/ML IJ SOLN
10.0000 mg | Freq: Once | INTRAMUSCULAR | Status: AC
  Administered 2023-11-13: 10 mg via INTRAVENOUS
  Filled 2023-11-13: qty 1

## 2023-11-13 MED ORDER — METHOCARBAMOL 500 MG PO TABS
500.0000 mg | ORAL_TABLET | Freq: Four times a day (QID) | ORAL | Status: DC | PRN
  Administered 2023-11-13 – 2023-11-15 (×6): 500 mg via ORAL
  Filled 2023-11-13 (×6): qty 1

## 2023-11-13 MED ORDER — AMITRIPTYLINE HCL 10 MG PO TABS
10.0000 mg | ORAL_TABLET | Freq: Every day | ORAL | Status: DC
  Administered 2023-11-13 – 2023-11-15 (×3): 10 mg via ORAL
  Filled 2023-11-13 (×3): qty 1

## 2023-11-13 MED ORDER — LEVOTHYROXINE SODIUM 75 MCG PO TABS
75.0000 ug | ORAL_TABLET | Freq: Every day | ORAL | Status: DC
  Administered 2023-11-14 – 2023-11-16 (×3): 75 ug via ORAL
  Filled 2023-11-13 (×3): qty 1

## 2023-11-13 MED ORDER — ZOLPIDEM TARTRATE 5 MG PO TABS
5.0000 mg | ORAL_TABLET | Freq: Every evening | ORAL | Status: DC | PRN
  Administered 2023-11-13 – 2023-11-15 (×3): 5 mg via ORAL
  Filled 2023-11-13 (×3): qty 1

## 2023-11-13 MED ORDER — ROSUVASTATIN CALCIUM 5 MG PO TABS
10.0000 mg | ORAL_TABLET | Freq: Every day | ORAL | Status: DC
  Administered 2023-11-13 – 2023-11-16 (×4): 10 mg via ORAL
  Filled 2023-11-13 (×4): qty 2

## 2023-11-13 MED ORDER — ADULT MULTIVITAMIN W/MINERALS CH
1.0000 | ORAL_TABLET | Freq: Every day | ORAL | Status: DC
  Administered 2023-11-14 – 2023-11-16 (×3): 1 via ORAL
  Filled 2023-11-13 (×3): qty 1

## 2023-11-13 MED ORDER — DORZOLAMIDE HCL-TIMOLOL MAL 2-0.5 % OP SOLN
1.0000 [drp] | Freq: Two times a day (BID) | OPHTHALMIC | Status: DC
  Administered 2023-11-13 – 2023-11-16 (×6): 1 [drp] via OPHTHALMIC
  Filled 2023-11-13: qty 10

## 2023-11-13 NOTE — H&P (Signed)
 Amy Glenn is an 73 y.o. female.   Chief Complaint: Weakness HPI: 73 year old female recently status post total anterior cervical decompression and fusion by Dr. Joshua.  Initially the patient did well.  She reported that she awakened with profound weakness in both upper and lower extremities with signs of numbness and incontinence.  She reports some mild posterior cervical pain.  She is having no significant wound swelling.  She is having no dysphagia or dysphonia.  She was evaluated in the emergency room and an MRI scan of her cervical spine was obtained.  MRI scanning of her cervical spine demonstrates reasonably normal postoperative change at C4-5, C5-6 and C6-7.  There is no evidence of any ongoing cervical stenosis or cord compression.  There is a mild amount of prevertebral swelling edema and hemorrhage which is normal and expected after the surgery.  Her airway is midline.  The patient denies lower back pain.  She is having no radiating pain into her lower extremities.  She has good sensation in her perineal region.  Past Medical History:  Diagnosis Date   Anxiety    Arthritis    Chronic bronchitis (HCC)    Depression    Dysrhythmia    Fatty liver    GERD (gastroesophageal reflux disease)    HLD (hyperlipidemia)    Hypertension    Hypothyroidism    Wears hearing aid    both ears    Past Surgical History:  Procedure Laterality Date   ANTERIOR CERVICAL DECOMP/DISCECTOMY FUSION N/A 11/10/2023   Procedure: ANTERIOR CERVICAL DECOMPRESSION/DISCECTOMY FUSION CERVICAL FOUR-FIVE, CERVICAL FIVE-SIX, CERVICAL SIX-SEVEN;  Surgeon: Joshua Alm Hamilton, MD;  Location: [facility] OR;  Service: Neurosurgery;  Laterality: N/A;  ACDF - C4-C5 - C5-C6 - C6-C7   APPENDECTOMY     CHOLECYSTECTOMY  03/16/1980   DILATION AND CURETTAGE OF UTERUS  03/16/2001   fibroids   ECTOPIC PREGNANCY SURGERY  1979x2   on rt   ECTOPIC PREGNANCY SURGERY  03/16/1978   x2 left   EXPLORATORY LAPAROTOMY  10/15/1975    for fertility   HERNIA REPAIR  03/16/1993   lt LA   KNEE ARTHROSCOPY Left 07/12/2012   Procedure: LEFT KNEE ARTHROSCOPY ;  Surgeon: Maude KANDICE Herald, MD;  Location: [facility];  Service: Orthopedics;  Laterality: Left;  Left knee arthroscopy with partial medial menisectomy and chondroplasty   LAPAROSCOPIC ABDOMINAL EXPLORATION  03/16/1976   repair fall tube scarring fertility   LOOP RECORDER IMPLANT     REPLACEMENT UNICONDYLAR JOINT KNEE Right 2010   REPLACEMENT UNICONDYLAR JOINT KNEE Left 2008   SHOULDER ARTHROSCOPY  03/17/2003   left   XI ROBOTIC ASSISTED LOWER ANTERIOR RESECTION N/A 05/30/2021   Procedure: XI ROBOTIC ASSISTED LOWER ANTERIOR RESECTION OF COLON RECTOSIGMOID, INTRAOPERATIVE ASSESSMENT OF PERFUSION USING FIREFLY, TAP BLOCK, AND RIGID PROCTOSCOPY;  Surgeon: Sheldon Standing, MD;  Location: [facility] ORS;  Service: General;  Laterality: N/A;    Family History  Problem Relation Age of Onset   Hypertension Sister    Social History:  reports that she has never smoked. She has never used smokeless tobacco. She reports current alcohol use. She reports that she does not use drugs.  Allergies: No Known Allergies  (Not in a hospital admission)   Results for orders placed or performed during the hospital encounter of 11/13/23 (from the past 48 hours)  CBC     Status: None   Collection Time: 11/13/23  8:42 AM  Result Value Ref Range   WBC 9.4 4.0 -  10.5 K/uL   RBC 4.58 3.87 - 5.11 MIL/uL   Hemoglobin 13.9 12.0 - 15.0 g/dL   HCT 57.8 63.9 - 53.9 %   MCV 91.9 80.0 - 100.0 fL   MCH 30.3 26.0 - 34.0 pg   MCHC 33.0 30.0 - 36.0 g/dL   RDW 86.3 88.4 - 84.4 %   Platelets 305 150 - 400 K/uL   nRBC 0.0 0.0 - 0.2 %    Comment: Performed at [facility], [address] [place], [place] [address]  Basic metabolic panel     Status: None   Collection Time: 11/13/23  8:42 AM  Result Value Ref Range   Sodium 140 135 - 145 mmol/L   Potassium 3.8 3.5 - 5.1 mmol/L    Chloride 101 98 - 111 mmol/L   CO2 29 22 - 32 mmol/L   Glucose, Bld 92 70 - 99 mg/dL    Comment: Glucose reference range applies only to samples taken after fasting for at least 8 hours.   BUN 12 8 - 23 mg/dL   Creatinine, Ser 9.25 0.44 - 1.00 mg/dL   Calcium  9.0 8.9 - 10.3 mg/dL   GFR, Estimated >39 >39 mL/min    Comment: (NOTE) Calculated using the CKD-EPI Creatinine Equation (2021)    Anion gap 10 5 - 15    Comment: Performed at [facility], [address], [place], [place] [address]   MR Cervical Spine W and Wo Contrast Result Date: 11/13/2023 CLINICAL DATA:  73 year old female postoperative day 3 ACDF. Weakness, incontinence. EXAM: MRI CERVICAL SPINE WITHOUT AND WITH CONTRAST TECHNIQUE: Multiplanar and multiecho pulse sequences of the cervical spine, to include the craniocervical junction and cervicothoracic junction, were obtained without and with intravenous contrast. CONTRAST:  8mL GADAVIST  GADOBUTROL  1 MMOL/ML IV SOLN COMPARISON:  Intraoperative images [id number]. Preoperative MRI 07/23/2023. FINDINGS: Alignment: Improved postoperative cervical lordosis. No significant spondylolisthesis, aside from mild chronic and degenerative anterolisthesis of C7 on T1. Vertebrae: ACDF hardware artifact now C4 through C7, with normal background bone marrow signal. No suspicious marrow edema or enhancement. Cord: Normal. No abnormal intradural enhancement. No dural thickening. Posterior Fossa, vertebral arteries, paraspinal tissues: Cervicomedullary junction is within normal limits. Preserved major vascular flow voids in the bilateral neck. Postoperative prevertebral and retropharyngeal space fluid or edema tracking throughout the cervical spine and just past the cervicothoracic junction (see series 3, image 12). This measures up to 5 mm in thickness above C6. At the C6 level postcontrast images do suggest a discrete fluid collection there encompassing 7 x 29 x 29 mm (AP by transverse by CC), estimated  volume 3 mL. 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 (ref: J am Coll Radiol. 2015 Feb;12(2): 143-50). Otherwise small volume fluid and edema along the surgical approach from the right anterior neck as seen on series 6, image 22. Disc levels: No cervical spinal stenosis. Mild disc bulging C2-C3 and C3-C4 is stable as detailed on the preoperative MRI. ACDF C4-C5 through C6-C7 with no spinal stenosis. Mild to moderate facet hypertrophy at C4-C5 with trace bilateral facet joint fluid. Moderate facet hypertrophy at C5-C6 greater on the right, trace facet joint fluid. The right C5 and left C6 neural foraminal patency appears stable to improved. No visible upper thoracic spinal stenosis. IMPRESSION: C4 through C7 ACDF with expected postoperative MRI appearance; small 3 mL prevertebral hematoma/seroma overlying the C6 and C7 vertebrae. No cervical spinal stenosis. And no progressive degeneration or stenosis  from the preoperative MRI. Electronically Signed   By: VEAR Hurst M.D.   On: 11/13/2023 09:54    Pertinent items noted in HPI and remainder of comprehensive ROS otherwise negative.  Blood pressure (!) 144/95, pulse 94, temperature 98.3 F (36.8 C), temperature source Oral, resp. rate 16, height 5' 7 (1.702 m), weight 87 kg, SpO2 99%.  The patient is awake and alert.  She is oriented and anxious but otherwise appropriate.  Her speech is fluent.  Her judgment insight are intact.  Her cranial nerve function is normal bilaterally.  Motor examination is 5/5 in bilateral upper and lower extremities.  Sensory examination is normal bilaterally.  Deep tendon axes are normal active.  There are no evidence of long track signs.  Her cervical wound is healing well.  There is no evidence of swelling.  Her airway is midline.  She is breathing easily.  Her chest and abdomen are benign. Assessment/Plan Episode of transient postoperative weakness following  anterior cervical decompression and fusion surgery.  I see no ongoing major structural issues for which to be concerned however the patient is uncomfortable with the idea going home.  Plan to admit for observation and placed on IV steroids.  Will mobilize with physical therapy.  Likely will be able to go home over the next day or 2.  [facility] Ieisha Gao 11/13/2023, 11:21 AM

## 2023-11-13 NOTE — ED Triage Notes (Signed)
 Pt BIB [facility] from home for generalized weakness since midnight last night. Pt had spinal fusion on Wednesday. Per pt felt very weak to the point she could not walk. Pt also reporting urinary incontinence beginning around the same time, denies dysuria. AOx4  EMS Vitals 126/78 HR 92 >94% RA

## 2023-11-13 NOTE — Evaluation (Signed)
 Physical Therapy Evaluation Patient Details Name: Amy Glenn MRN: [id number] DOB: [birth year] Today's Date: 11/13/2023  History of Present Illness  Pt is a 73 y/o female presenting on 8/30 for weakness and incontinence. Recent ACDF C4-5, C5-6, C6-7; on 8/28. PMH includes: anxiety, arthritis, chronic bronchitis, HTN.  Clinical Impression  Pt admitted with above diagnosis. Reports she had a friend staying with her post-op but will be alone at d/c. Was unable to use RW due to UE weakness. During evaluation today pt required up to min assist for bed mobility, CGA to transfer, and CGA to ambulate with RW for support. No buckling but develops significant shuffled, festinating type gait as she becomes fatigued. Worked on techniques to overcome this and develop a good sense of awareness to maximize safety during recovery. Suggested she try to arrange some assist at home again for a while due to transient symptoms. Will continue to follow and progress acutely. Pt currently with functional limitations due to the deficits listed below (see PT Problem List). Pt will benefit from acute skilled PT to increase their independence and safety with mobility to allow discharge.           If plan is discharge home, recommend the following: A little help with bathing/dressing/bathroom;Assist for transportation;A little help with walking and/or transfers;Assistance with cooking/housework   Can travel by private vehicle        Equipment Recommendations None recommended by PT  Recommendations for Other Services       Functional Status Assessment Patient has had a recent decline in their functional status and demonstrates the ability to make significant improvements in function in a reasonable and predictable amount of time.     Precautions / Restrictions Precautions Precautions: Cervical Recall of Precautions/Restrictions: Intact Precaution/Restrictions Comments: discussed  precautions Restrictions Weight Bearing Restrictions Per Provider Order: No      Mobility  Bed Mobility Overal bed mobility: Needs Assistance Bed Mobility: Rolling, Sit to Sidelying, Sidelying to Sit Rolling: Supervision Sidelying to sit: Min assist     Sit to sidelying: Min assist General bed mobility comments: Supervision to roll, min assist to support trunk into seated position. Min assist for LEs into bed.    Transfers Overall transfer level: Needs assistance Equipment used: Rolling walker (2 wheels) Transfers: Sit to/from Stand Sit to Stand: Contact guard assist           General transfer comment: CGA for safety, stood x2 from edge of bed. Good recall of hand placement.    Ambulation/Gait Ambulation/Gait assistance: Contact guard assist Gait Distance (Feet): 30 Feet (+20) Assistive device: Rolling walker (2 wheels) Gait Pattern/deviations: Shuffle, Decreased stride length, Step-through pattern, Narrow base of support, Festinating Gait velocity: decreased Gait velocity interpretation: <1.8 ft/sec, indicate of risk for recurrent falls Pre-gait activities: stationary march General Gait Details: Initially with fair step through pattern but as she fatigues, pt begins to festinate at shuffle. Cues for awareness, and increased foot clearance with fair improvement. Tolerated two short distances in room without buckling. RW for support.  Stairs            Wheelchair Mobility     Tilt Bed    Modified Rankin (Stroke Patients Only)       Balance Overall balance assessment: History of Falls, Needs assistance Sitting-balance support: Feet supported, No upper extremity supported Sitting balance-Leahy Scale: Good     Standing balance support: No upper extremity supported, During functional activity Standing balance-Leahy Scale: Fair Standing balance comment: More confident with AD  Pertinent Vitals/Pain Pain  Assessment Pain Assessment: Faces Faces Pain Scale: Hurts little more Pain Location: neck Pain Descriptors / Indicators: Operative site guarding, Sore, Aching Pain Intervention(s): Monitored during session, Repositioned    Home Living Family/patient expects to be discharged to:: Private residence Living Arrangements: Alone Available Help at Discharge: Family;Available PRN/intermittently;Friend(s) Type of Home: House Home Access: Level entry       Home Layout: One level Home Equipment: Standard Walker;Cane - single point;Shower seat;Grab bars - tub/shower Additional Comments: Limited assist, states friend went home today.    Prior Function Prior Level of Function : Independent/Modified Independent;Driving             Mobility Comments: Prior to ACDF: used SPC out of home, usually no AD in home. Had almost weekly falls that she attributes to LLE weakness. Post-op, has not been using RW due to reported UE weakness. ADLs Comments: independent     Extremity/Trunk Assessment   Upper Extremity Assessment Upper Extremity Assessment: Defer to OT evaluation    Lower Extremity Assessment Lower Extremity Assessment: Generalized weakness LLE Deficits / Details: ankle DF 4/5 LLE Sensation: WNL LLE Coordination: decreased gross motor    Cervical / Trunk Assessment Cervical / Trunk Assessment: Neck Surgery  Communication   Communication Communication: Impaired Factors Affecting Communication: Hearing impaired (has hearing aides)    Cognition Arousal: Alert Behavior During Therapy: WFL for tasks assessed/performed   PT - Cognitive impairments: No apparent impairments                         Following commands: Intact       Cueing Cueing Techniques: Verbal cues     General Comments General comments (skin integrity, edema, etc.): Reviewed precautions, symptom awareness, energy conservation, and techniques to compensate for festinating pattern when she  fatigues.    Exercises     Assessment/Plan    PT Assessment Patient needs continued PT services  PT Problem List Decreased strength;Decreased balance;Decreased mobility;Decreased activity tolerance;Decreased coordination;Decreased knowledge of precautions;Pain;Decreased range of motion;Decreased knowledge of use of DME;Impaired sensation       PT Treatment Interventions DME instruction;Gait training;Functional mobility training;Therapeutic activities;Therapeutic exercise;Balance training;Neuromuscular re-education;Patient/family education    PT Goals (Current goals can be found in the Care Plan section)  Acute Rehab PT Goals Patient Stated Goal: Feel better before returning home PT Goal Formulation: With patient Time For Goal Achievement: 11/26/23 Potential to Achieve Goals: Good    Frequency Min 2X/week     Co-evaluation               AM-PAC PT 6 Clicks Mobility  Outcome Measure Help needed turning from your back to your side while in a flat bed without using bedrails?: None Help needed moving from lying on your back to sitting on the side of a flat bed without using bedrails?: A Little Help needed moving to and from a bed to a chair (including a wheelchair)?: A Little Help needed standing up from a chair using your arms (e.g., wheelchair or bedside chair)?: A Little Help needed to walk in hospital room?: A Little Help needed climbing 3-5 steps with a railing? : A Little 6 Click Score: 19    End of Session Equipment Utilized During Treatment: Gait belt Activity Tolerance: Patient tolerated treatment well Patient left: with call bell/phone within reach;in bed;with bed alarm set Nurse Communication: Mobility status PT Visit Diagnosis: Unsteadiness on feet (R26.81);History of falling (Z91.81);Muscle weakness (generalized) (M62.81);Pain;Other abnormalities of gait and  mobility (R26.89);Difficulty in walking, not elsewhere classified (R26.2);Other symptoms and signs  involving the nervous system (R29.898) Pain - part of body:  (neck)    Time: 8440-8372 PT Time Calculation (min) (ACUTE ONLY): 28 min   Charges:   PT Evaluation $PT Eval Low Complexity: 1 Low PT Treatments $Therapeutic Activity: 8-22 mins PT General Charges $$ ACUTE PT VISIT: 1 Visit         Leontine Roads, PT, DPT [facility]  Rehabilitation Services Physical Therapist Office: [phone] Website: [email or web address]   Leontine GORMAN Roads 11/13/2023, 4:47 PM

## 2023-11-13 NOTE — ED Provider Notes (Signed)
 [place] EMERGENCY DEPARTMENT AT [facility] Provider Note   CSN: [id number] Arrival date & time: 11/13/23  9182     Patient presents with: No chief complaint on file.   Amy Glenn is a 73 y.o. female with past medical history seen for arthritis, GERD, hyperlipidemia status post anterior cervical decompression, discectomy, fusion at level 4 5, 5 6, 6 7 in the cervical spine on 8/27 who presents with concern for weakness, feeling like she could not get out of bed, without new numbness but with new urinary incontinence.  She reports that she did not feel the sensation of herself peeing.  She denies any urinary retention, she denies any constipation.  She denies any saddle anesthesia.   HPI     Prior to Admission medications   Medication Sig Start Date End Date Taking? Authorizing Provider  amitriptyline  (ELAVIL ) 10 MG tablet Take 10 mg by mouth at bedtime.    [provider]  b complex vitamins capsule Take 1 capsule by mouth daily.    [provider]  buPROPion  ER (WELLBUTRIN  SR) 100 MG 12 hr tablet Take 100 mg by mouth every morning.    [provider]  dorzolamide -timolol  (COSOPT ) 22.3-6.8 MG/ML ophthalmic solution Place 1 drop into both eyes 2 (two) times daily. 01/11/20   [provider]  HYDROcodone -acetaminophen  (NORCO) 10-325 MG tablet Take 1 tablet by mouth every 4 (four) hours as needed for moderate pain (pain score 4-6). 11/11/23   Amy Alm Hamilton, MD  lansoprazole (PREVACID) 15 MG capsule Take 15 mg by mouth daily.    [provider]  levothyroxine  (SYNTHROID ) 75 MCG tablet Take 75 mcg by mouth every morning. 11/25/19   [provider]  metFORMIN  (GLUCOPHAGE -XR) 500 MG 24 hr tablet Take 500 mg by mouth daily with supper.    [provider]  methocarbamol  (ROBAXIN ) 500 MG tablet Take 1 tablet (500 mg total) by mouth every 6 (six) hours as needed for muscle spasms. 11/11/23   Amy Alm Hamilton, MD   Multiple Vitamin (MULTIVITAMIN WITH MINERALS) TABS tablet Take 1 tablet by mouth daily.    [provider]  PARoxetine  (PAXIL ) 20 MG tablet Take 20 mg by mouth every morning.    [provider]  rosuvastatin  (CRESTOR ) 10 MG tablet TAKE 1 TABLET BY MOUTH AT BEDTIME 12/30/22   Amy Candyce RAMAN, MD  zolpidem  (AMBIEN ) 5 MG tablet Take 5 mg by mouth at bedtime as needed for sleep. 09/13/23   [provider]    Allergies: Patient has no known allergies.    Review of Systems  All other systems reviewed and are negative.   Updated Vital Signs BP (!) 144/95   Pulse 94   Temp 98.3 F (36.8 C) (Oral)   Resp 16   Ht 5' 7 (1.702 m)   Wt 87 kg   SpO2 99%   BMI 30.04 kg/m   Physical Exam Vitals and nursing note reviewed.  Constitutional:      General: She is not in acute distress.    Appearance: Normal appearance.  HENT:     Head: Normocephalic and atraumatic.  Eyes:     General:        Right eye: No discharge.        Left eye: No discharge.  Cardiovascular:     Rate and Rhythm: Normal rate and regular rhythm.     Pulses: Normal pulses.     Heart sounds: No murmur heard.  No friction rub. No gallop.  Pulmonary:     Effort: Pulmonary effort is normal.     Breath sounds: Normal breath sounds.  Abdominal:     General: Bowel sounds are normal.     Palpations: Abdomen is soft.  Musculoskeletal:     Comments: intact strength 5/5 of bilateral upper and lower extremities on my exam  Skin:    General: Skin is warm and dry.     Capillary Refill: Capillary refill takes less than 2 seconds.     Comments: Normal appearance of surgical incision site  Neurological:     Mental Status: She is alert and oriented to person, place, and time.     Comments: Normal sensation throughout upper and lower extremities, and in groin  Psychiatric:        Mood and Affect: Mood normal.        Behavior: Behavior normal.     (all labs ordered are listed, but only  abnormal results are displayed) Labs Reviewed  CBC  BASIC METABOLIC PANEL WITH GFR    EKG: None  Radiology: MR Cervical Spine W and Wo Contrast Result Date: 11/13/2023 CLINICAL DATA:  73 year old female postoperative day 3 ACDF. Weakness, incontinence. EXAM: MRI CERVICAL SPINE WITHOUT AND WITH CONTRAST TECHNIQUE: Multiplanar and multiecho pulse sequences of the cervical spine, to include the craniocervical junction and cervicothoracic junction, were obtained without and with intravenous contrast. CONTRAST:  8mL GADAVIST  GADOBUTROL  1 MMOL/ML IV SOLN COMPARISON:  Intraoperative images [id number]. Preoperative MRI 07/23/2023. FINDINGS: Alignment: Improved postoperative cervical lordosis. No significant spondylolisthesis, aside from mild chronic and degenerative anterolisthesis of C7 on T1. Vertebrae: ACDF hardware artifact now C4 through C7, with normal background bone marrow signal. No suspicious marrow edema or enhancement. Cord: Normal. No abnormal intradural enhancement. No dural thickening. Posterior Fossa, vertebral arteries, paraspinal tissues: Cervicomedullary junction is within normal limits. Preserved major vascular flow voids in the bilateral neck. Postoperative prevertebral and retropharyngeal space fluid or edema tracking throughout the cervical spine and just past the cervicothoracic junction (see series 3, image 12). This measures up to 5 mm in thickness above C6. At the C6 level postcontrast images do suggest a discrete fluid collection there encompassing 7 x 29 x 29 mm (AP by transverse by CC), estimated volume 3 mL. 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 (ref: J am Coll Radiol. 2015 Feb;12(2): 143-50). Otherwise small volume fluid and edema along the surgical approach from the right anterior neck as seen on series 6, image 22. Disc levels: No cervical spinal stenosis. Mild disc bulging C2-C3 and C3-C4 is stable as detailed on  the preoperative MRI. ACDF C4-C5 through C6-C7 with no spinal stenosis. Mild to moderate facet hypertrophy at C4-C5 with trace bilateral facet joint fluid. Moderate facet hypertrophy at C5-C6 greater on the right, trace facet joint fluid. The right C5 and left C6 neural foraminal patency appears stable to improved. No visible upper thoracic spinal stenosis. IMPRESSION: C4 through C7 ACDF with expected postoperative MRI appearance; small 3 mL prevertebral hematoma/seroma overlying the C6 and C7 vertebrae. No cervical spinal stenosis. And no progressive degeneration or stenosis from the preoperative MRI. Electronically Signed   By: VEAR Hurst M.D.   On: 11/13/2023 09:54     Procedures   Medications Ordered in the ED  PARoxetine  (PAXIL ) tablet 20 mg (has no administration in time range)  amitriptyline  (ELAVIL ) tablet 10 mg (has no administration in time range)  dorzolamide -timolol  (COSOPT ) 2-0.5 %  ophthalmic solution 1 drop (has no administration in time range)  levothyroxine  (SYNTHROID ) tablet 75 mcg (has no administration in time range)  multivitamin with minerals tablet 1 tablet (has no administration in time range)  b complex vitamins capsule 1 capsule (has no administration in time range)  rosuvastatin  (CRESTOR ) tablet 10 mg (has no administration in time range)  buPROPion  ER (WELLBUTRIN  SR) 12 hr tablet 100 mg (has no administration in time range)  metFORMIN  (GLUCOPHAGE -XR) 24 hr tablet 500 mg (has no administration in time range)  zolpidem  (AMBIEN ) tablet 5 mg (has no administration in time range)  methocarbamol  (ROBAXIN ) tablet 500 mg (has no administration in time range)  HYDROcodone -acetaminophen  (NORCO) 10-325 MG per tablet 1 tablet (has no administration in time range)  pantoprazole  (PROTONIX ) EC tablet 20 mg (has no administration in time range)  dexamethasone  (DECADRON ) injection 4 mg (has no administration in time range)  HYDROcodone -acetaminophen  (NORCO/VICODIN) 5-325 MG per tablet 1  tablet (1 tablet Oral Given 11/13/23 0851)  gadobutrol  (GADAVIST ) 1 MMOL/ML injection 8 mL (8 mLs Intravenous Contrast Given 11/13/23 0939)  dexamethasone  (DECADRON ) injection 10 mg (10 mg Intravenous Given 11/13/23 1058)                                    Medical Decision Making Amount and/or Complexity of Data Reviewed Labs: ordered. Radiology: ordered.  Risk Prescription drug management. Decision regarding hospitalization.   This patient is a 73 y.o. female who presents to the ED for concern of weakness, bladder incontinence.   Differential diagnoses prior to evaluation: CVA, spinal cord injury, ACS, arrhythmia, syncope, orthostatic hypotension, sepsis, hypoglycemia, hypoxia, electrolyte disturbance, endocrine disorder, anemia, environmental exposure, polypharmacy  Past Medical History / Social History / Additional history: Chart reviewed. Pertinent results include: tatus post anterior cervical decompression, discectomy, fusion at level 4-5, 5-6, 6-7 in the cervical spine on 8/27   Physical Exam, labs, imaging. Physical exam performed. The pertinent findings include: intact strength 5/5 of bilateral upper and lower extremities on my exam Normal appearance of surgical incision site  Normal sensation throughout upper and lower extremities, and in groin   I independently interpreted labs and imaging: CBC, BMP which are unremarkable.  MR cervical spine with and without contrast shows:  C4 through C7 ACDF with expected postoperative MRI appearance; small  3 mL prevertebral hematoma/seroma overlying the C6 and C7 vertebrae.  No cervical spinal stenosis. And no progressive degeneration or  stenosis from the preoperative MRI.   I agree with the radiologist interpretation. Bladder scan after void shows no signs of urinary retention.  Consults: Spoke with the neurosurgeon, Gerard Beck, NP who after reviewing her scan recommends IV Decadron   Medications / Treatment: As above,  Decadron , rest, neurosurgery assessed at bedside, Dr. Malcolm does think that patient would benefit from hospital admission for continued IV steroids.  Her bladder scan shows 160 mL of retained urine just after voiding.  Neurosurgery will direct admit.  Final diagnoses:  Weakness  Urinary incontinence, unspecified type    ED Discharge Orders     None          Amy Glenn 11/13/23 1136    Yolande Lamar BROCKS, MD 11/16/23 [phone]

## 2023-11-13 NOTE — Plan of Care (Signed)
   Problem: Health Behavior/Discharge Planning: Goal: Ability to manage health-related needs will improve Outcome: Progressing   Problem: Clinical Measurements: Goal: Ability to maintain clinical measurements within normal limits will improve Outcome: Progressing   Problem: Clinical Measurements: Goal: Respiratory complications will improve Outcome: Progressing

## 2023-11-13 NOTE — ED Notes (Signed)
 Patient transported to MRI

## 2023-11-13 NOTE — Progress Notes (Addendum)
 1820: Pt HR continues to be increased. Past vitals show HR in the 90's. Pt was in pain, so pain medication given in hopes it would help. However, pt is still running ST in the 110's and said her pain is better. Dr. Louis paged, awaiting response.  1836: physician aware, will start some fluids in case she is dehydrated.

## 2023-11-14 MED ORDER — OXYCODONE-ACETAMINOPHEN 7.5-325 MG PO TABS
1.0000 | ORAL_TABLET | ORAL | Status: DC | PRN
  Administered 2023-11-14 – 2023-11-15 (×6): 1 via ORAL
  Filled 2023-11-14 (×6): qty 1

## 2023-11-14 MED ORDER — DEXAMETHASONE SODIUM PHOSPHATE 4 MG/ML IJ SOLN
4.0000 mg | Freq: Two times a day (BID) | INTRAMUSCULAR | Status: DC
  Administered 2023-11-14 – 2023-11-16 (×4): 4 mg via INTRAVENOUS
  Filled 2023-11-14 (×4): qty 1

## 2023-11-14 MED ORDER — ENOXAPARIN SODIUM 40 MG/0.4ML IJ SOSY
40.0000 mg | PREFILLED_SYRINGE | Freq: Every day | INTRAMUSCULAR | Status: DC
  Administered 2023-11-14 – 2023-11-16 (×3): 40 mg via SUBCUTANEOUS
  Filled 2023-11-14 (×3): qty 0.4

## 2023-11-14 NOTE — Progress Notes (Signed)
 Physical Therapy Treatment Patient Details Name: Amy Glenn MRN: [id number] DOB: [birth year] Today's Date: 11/14/2023   History of Present Illness Pt is a 73 y/o female presenting on 8/30 for weakness and incontinence. Recent ACDF C4-5, C5-6, C6-7; on 8/28. PMH includes: anxiety, arthritis, chronic bronchitis, HTN.    PT Comments  Pt currently is unable to get any assistance at home. Pt requires Min A to CGA for short non-functional distance gait that requires 12 min to walk barely home distances. Pt fatigues easily and is at high risk for falls. CGA to supervision for sit to stand with RW. Due to pt current functional status, home set up and available assistance at home recommending skilled physical therapy services < 3 hours/day in order to address strength, balance and functional mobility to decrease risk for falls, injury, immobility, skin break down and re-hospitalization.      If plan is discharge home, recommend the following: A little help with bathing/dressing/bathroom;Assist for transportation;A little help with walking and/or transfers;Assistance with cooking/housework     Equipment Recommendations  None recommended by PT       Precautions / Restrictions Precautions Precautions: Cervical Precaution Booklet Issued: Yes (comment) Recall of Precautions/Restrictions: Intact Precaution/Restrictions Comments: discussed precautions Restrictions Weight Bearing Restrictions Per Provider Order: No     Mobility  Bed Mobility   General bed mobility comments: pt sitting EOB at beginning and end of session    Transfers Overall transfer level: Needs assistance Equipment used: Rolling walker (2 wheels) Transfers: Sit to/from Stand Sit to Stand: Contact guard assist           General transfer comment: CGA, verbal cues for safety, CGA for stabilization    Ambulation/Gait Ambulation/Gait assistance: Contact guard assist, Min assist Gait Distance (Feet): 50  Feet Assistive device: Rolling walker (2 wheels) Gait Pattern/deviations: Shuffle, Decreased stride length, Step-through pattern, Narrow base of support, Festinating Gait velocity: decreased Gait velocity interpretation: <1.8 ft/sec, indicate of risk for recurrent falls   General Gait Details: short steps with partial step through gait pattern, with fatigue pt balance begins to become more impaired with increased festination and shuffling. Pt with difficulty navigating objects especially on the L. Very low foot clearance with increased steps and impaired balance with turns. Pt takes ~12 min to ambulate50 ft.      Balance Overall balance assessment: History of Falls, Needs assistance Sitting-balance support: No upper extremity supported, Feet supported Sitting balance-Leahy Scale: Good     Standing balance support: No upper extremity supported, During functional activity Standing balance-Leahy Scale: Poor Standing balance comment: Pt is able to stand without UE Support but requires intermittent UE support, Reliant on assistive device during dynamic functional activities with intermittent impaired balance even with bil UE support.      Communication Communication Communication: Impaired Factors Affecting Communication: Hearing impaired (has hearing aids)  Cognition Arousal: Alert Behavior During Therapy: WFL for tasks assessed/performed   PT - Cognitive impairments: No apparent impairments     Following commands: Intact      Cueing Cueing Techniques: Verbal cues         Pertinent Vitals/Pain Pain Assessment Pain Assessment: No/denies pain    Home Living Family/patient expects to be discharged to:: Private residence Living Arrangements: Alone Available Help at Discharge: Family;Friend(s);Available PRN/intermittently Type of Home: House Home Access: Level entry       Home Layout: One level Home Equipment: Standard Walker;Cane - single point;Shower seat;Grab bars -  tub/shower;Hand held shower head Additional Comments: Limited assist  PT Goals (current goals can now be found in the care plan section) Acute Rehab PT Goals Patient Stated Goal: Feel better before returning home PT Goal Formulation: With patient Time For Goal Achievement: 11/26/23 Potential to Achieve Goals: Good Progress towards PT goals: Progressing toward goals    Frequency    Min 2X/week      PT Plan  Updated discharge recommendations       AM-PAC PT 6 Clicks Mobility   Outcome Measure  Help needed turning from your back to your side while in a flat bed without using bedrails?: None Help needed moving from lying on your back to sitting on the side of a flat bed without using bedrails?: A Little Help needed moving to and from a bed to a chair (including a wheelchair)?: A Little Help needed standing up from a chair using your arms (e.g., wheelchair or bedside chair)?: A Little Help needed to walk in hospital room?: A Little Help needed climbing 3-5 steps with a railing? : A Little 6 Click Score: 19    End of Session Equipment Utilized During Treatment: Gait belt Activity Tolerance: Patient tolerated treatment well Patient left: with call bell/phone within reach;in bed Nurse Communication: Mobility status PT Visit Diagnosis: Unsteadiness on feet (R26.81);History of falling (Z91.81);Muscle weakness (generalized) (M62.81);Pain;Other abnormalities of gait and mobility (R26.89);Difficulty in walking, not elsewhere classified (R26.2);Other symptoms and signs involving the nervous system (R29.898)     Time: 8782-8769 PT Time Calculation (min) (ACUTE ONLY): 13 min  Charges:    $Therapeutic Activity: 8-22 mins PT General Charges $$ ACUTE PT VISIT: 1 Visit                     Dorothyann Maier, DPT, CLT  Acute Rehabilitation Services Office: [phone] (Secure chat preferred)    [name] 11/14/2023, 12:39 PM

## 2023-11-14 NOTE — Plan of Care (Signed)
  Problem: Education: Goal: Knowledge of General Education information will improve Description: Including pain rating scale, medication(s)/side effects and non-pharmacologic comfort measures Outcome: Progressing   Problem: Clinical Measurements: Goal: Will remain free from infection Outcome: Progressing   Problem: Coping: Goal: Level of anxiety will decrease Outcome: Progressing   Problem: Pain Managment: Goal: General experience of comfort will improve and/or be controlled Outcome: Progressing   Problem: Safety: Goal: Ability to remain free from injury will improve Outcome: Progressing

## 2023-11-14 NOTE — Progress Notes (Signed)
 Assessment [name] who is s/p C4-7 ACDF Bonney 8/27) who re-presented with transient weakness and pain which has since resolved with steroids  LOS: 0 days    Plan: Pending dispo to rehab Dec[facility]  reduced to 4q12 AAT DAT DVT ppx   Subjective: Pt states her pain is improved. No new weakness. OT recommending rehab given limited mobility  Objective: Vital signs in last 24 hours: Temp:  [97.6 F (36.4 C)-98.7 F (37.1 C)] 97.6 F (36.4 C) (08/31 0815) Pulse Rate:  [86-116] 98 (08/31 0815) Resp:  [18-20] 20 (08/31 0815) BP: (118-151)/(73-92) 151/90 (08/31 0815) SpO2:  [93 %-96 %] 96 % (08/31 0815)  Intake/Output from previous day: No intake/output data recorded. Intake/Output this shift: No intake/output data recorded.  Exam: Anterior cervical incision c/d/I 5/5 BUE and BLE  Lab Results: Recent Labs    11/13/23 0842  WBC 9.4  HGB 13.9  HCT 42.1  PLT 305   BMET Recent Labs    11/13/23 0842  NA 140  K 3.8  CL 101  CO2 29  GLUCOSE 92  BUN 12  CREATININE 0.74  CALCIUM  9.0    Studies/Results: MR Cervical Spine W and Wo Contrast Result Date: 11/13/2023 CLINICAL DATA:  73 year old female postoperative day 3 ACDF. Weakness, incontinence. EXAM: MRI CERVICAL SPINE WITHOUT AND WITH CONTRAST TECHNIQUE: Multiplanar and multiecho pulse sequences of the cervical spine, to include the craniocervical junction and cervicothoracic junction, were obtained without and with intravenous contrast. CONTRAST:  8mL GADAVIST  GADOBUTROL  1 MMOL/ML IV SOLN COMPARISON:  Intraoperative images [id number]. Preoperative MRI 07/23/2023. FINDINGS: Alignment: Improved postoperative cervical lordosis. No significant spondylolisthesis, aside from mild chronic and degenerative anterolisthesis of C7 on T1. Vertebrae: ACDF hardware artifact now C4 through C7, with normal background bone marrow signal. No suspicious marrow edema or enhancement. Cord: Normal. No abnormal intradural enhancement. No dural  thickening. Posterior Fossa, vertebral arteries, paraspinal tissues: Cervicomedullary junction is within normal limits. Preserved major vascular flow voids in the bilateral neck. Postoperative prevertebral and retropharyngeal space fluid or edema tracking throughout the cervical spine and just past the cervicothoracic junction (see series 3, image 12). This measures up to 5 mm in thickness above C6. At the C6 level postcontrast images do suggest a discrete fluid collection there encompassing 7 x 29 x 29 mm (AP by transverse by CC), estimated volume 3 mL. 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 (ref: J am Coll Radiol. 2015 Feb;12(2): 143-50). Otherwise small volume fluid and edema along the surgical approach from the right anterior neck as seen on series 6, image 22. Disc levels: No cervical spinal stenosis. Mild disc bulging C2-C3 and C3-C4 is stable as detailed on the preoperative MRI. ACDF C4-C5 through C6-C7 with no spinal stenosis. Mild to moderate facet hypertrophy at C4-C5 with trace bilateral facet joint fluid. Moderate facet hypertrophy at C5-C6 greater on the right, trace facet joint fluid. The right C5 and left C6 neural foraminal patency appears stable to improved. No visible upper thoracic spinal stenosis. IMPRESSION: C4 through C7 ACDF with expected postoperative MRI appearance; small 3 mL prevertebral hematoma/seroma overlying the C6 and C7 vertebrae. No cervical spinal stenosis. And no progressive degeneration or stenosis from the preoperative MRI. Electronically Signed   By: VEAR Hurst M.D.   On: 11/13/2023 09:54      Amy Glenn 11/14/2023, 11:48 AM

## 2023-11-14 NOTE — Evaluation (Signed)
 Occupational Therapy Evaluation Patient Details Name: Amy Glenn MRN: [id number] DOB: October 07, 1950 Today's Date: 11/14/2023   History of Present Illness   Pt is a 73 y/o female presenting on 8/30 for weakness and incontinence. Recent ACDF C4-5, C5-6, C6-7; on 8/28. PMH includes: anxiety, arthritis, chronic bronchitis, HTN.     Clinical Impressions This 73 yo female admitted with above presents to acute OT with PLOF before 3 level ACDF of being independent to Mod I with all basic ADLs and IADLs including driving. Currently she is setup-Mod A for basic ADLs (mainly for LB ADLs due to decreased strength in her legs she cannot cross one of the other while seated to get to lower legs and feet). She is also having to use a rolling walker to make her feel steadier on her feet thus making it more difficult for ADLs and IADLs as well as having to follow cervical precautions for these and she lives alone with only occasional assist from friends and family. She will continue to benefit from acute OT with follow up from continued inpatient follow up therapy, <3 hours/day to give her the optimal therapy for her to be able to take care of herself.      If plan is discharge home, recommend the following:   Assistance with cooking/housework;Assist for transportation;A little help with walking and/or transfers;A lot of help with bathing/dressing/bathroom     Functional Status Assessment   Patient has had a recent decline in their functional status and demonstrates the ability to make significant improvements in function in a reasonable and predictable amount of time.     Equipment Recommendations   None recommended by OT      Precautions/Restrictions   Precautions Precautions: Cervical Recall of Precautions/Restrictions: Intact Precaution/Restrictions Comments: discussed precautions Required Braces or Orthoses:  (no brace needed per orders from prior admission) Restrictions Weight  Bearing Restrictions Per Provider Order: No     Mobility Bed Mobility Overal bed mobility: Modified Independent             General bed mobility comments: use of rail, used appropriate technique in and OOB    Transfers Overall transfer level: Needs assistance Equipment used: Rolling walker (2 wheels), None Transfers: Sit to/from Stand Sit to Stand: Contact guard assist           General transfer comment: very tenative ambulating without RW from bed>sink>bed      Balance Overall balance assessment: History of Falls, Needs assistance Sitting-balance support: No upper extremity supported, Feet supported Sitting balance-Leahy Scale: Good     Standing balance support: No upper extremity supported, During functional activity Standing balance-Leahy Scale: Fair Standing balance comment: standing at sink to brush hair                           ADL either performed or assessed with clinical judgement   ADL Overall ADL's : Needs assistance/impaired Eating/Feeding: Independent;Sitting   Grooming: Contact guard assist;Standing;Brushing hair   Upper Body Bathing: Set up;Sitting   Lower Body Bathing: Moderate assistance Lower Body Bathing Details (indicate cue type and reason): CGA sit<>stand Upper Body Dressing : Set up;Sitting   Lower Body Dressing: Moderate assistance Lower Body Dressing Details (indicate cue type and reason): CGA sit<>stand Toilet Transfer: Contact guard assist;Ambulation;Rolling walker (2 wheels) Toilet Transfer Details (indicate cue type and reason): simulated bed>sink to brush hair>ambulate in hallway short distance with RW (does not have strength/stamina to feel safe ambulating without RW)  Toileting- Clothing Manipulation and Hygiene: Contact guard assist;Sit to/from stand               Vision Patient Visual Report: No change from baseline              Pertinent Vitals/Pain Pain Assessment Pain Assessment: No/denies pain      Extremity/Trunk Assessment Upper Extremity Assessment Upper Extremity Assessment: Generalized weakness (Doing some better today per pt)           Communication Communication Factors Affecting Communication: Hearing impaired (has hearing aids)   Cognition Arousal: Alert Behavior During Therapy: Anxious Cognition: No apparent impairments                               Following commands: Intact       Cueing    Cueing Techniques: Verbal cues              Home Living Family/patient expects to be discharged to:: Private residence Living Arrangements: Alone Available Help at Discharge: Family;Friend(s);Available PRN/intermittently Type of Home: House Home Access: Level entry     Home Layout: One level     Bathroom Shower/Tub: Tub/shower unit;Curtain   Firefighter: Standard     Home Equipment: Standard Walker;Cane - single point;Shower seat;Grab bars - tub/shower;Hand held shower head   Additional Comments: Limited assist      Prior Functioning/Environment Prior Level of Function : Needs assist             Mobility Comments: Prior to ACDF: used SPC out of home, usually no AD in home. Had almost weekly falls that she attributes to LLE weakness. Post-op, has not been using RW due to reported UE weakness. ADLs Comments: Since surgery she has needed A for basic ADLs due to increasing weakness    OT Problem List: Decreased strength;Decreased activity tolerance;Impaired balance (sitting and/or standing)   OT Treatment/Interventions: Self-care/ADL training;DME and/or AE instruction;Balance training;Patient/family education      OT Goals(Current goals can be found in the care plan section)   Acute Rehab OT Goals Patient Stated Goal: to be able to take care of myself before going home OT Goal Formulation: With patient Time For Goal Achievement: 11/28/23 Potential to Achieve Goals: Good   OT Frequency:  Min 2X/week       AM-PAC  OT 6 Clicks Daily Activity     Outcome Measure Help from another person eating meals?: None Help from another person taking care of personal grooming?: A Little Help from another person toileting, which includes using toliet, bedpan, or urinal?: A Little Help from another person bathing (including washing, rinsing, drying)?: A Lot Help from another person to put on and taking off regular upper body clothing?: A Little Help from another person to put on and taking off regular lower body clothing?: A Lot 6 Click Score: 17   End of Session Equipment Utilized During Treatment: Gait belt;Rolling walker (2 wheels)  Activity Tolerance: Patient limited by fatigue (can only go short distances and do short tasks before fatigues) Patient left: in bed;with call bell/phone within reach;with bed alarm set  OT Visit Diagnosis: Other abnormalities of gait and mobility (R26.89);Muscle weakness (generalized) (M62.81)                Time: 9059-9040 OT Time Calculation (min): 19 min Charges:  OT General Charges $OT Visit: 1 Visit OT Evaluation $OT Eval Moderate Complexity: 1 Mod  Amy L. OT Acute Rehabilitation  Services Office [phone]    [place] Amy Glenn 11/14/2023, 10:10 AM

## 2023-11-14 NOTE — Plan of Care (Signed)

## 2023-11-14 NOTE — TOC Transition Note (Signed)
 Transition of Care [facility]) - Discharge Note   Patient Details  Name: Amy Glenn MRN: [id number] Date of Birth: Aug 08, 1950  Transition of [facility] CM/SW Contact:  Marval Gell, RN Phone Number: 11/14/2023, 10:00 AM   Clinical Narrative:     Spoke w patient at bedside. She was recently DC'd and set up with [facility]. I have notified [facility] of readmission and likely DC in next 1-2 days     Barriers to Discharge: Continued Medical Work up   Patient Goals and CMS Choice            Discharge Placement                       Discharge Plan and Services Additional resources added to the After Visit Summary for                            [facility] Arranged: PT HH Agency: [facility] Date [facility] Agency Contacted: 11/14/23 Time HH Agency Contacted: 1000 Representative spoke with at [facility] Agency: Darleene  Social Drivers of Health (SDOH) Interventions SDOH Screenings   Food Insecurity: No Food Insecurity (11/13/2023)  Housing: Low Risk  (11/13/2023)  Transportation Needs: No Transportation Needs (11/13/2023)  Utilities: Not At Risk (11/13/2023)  Social Connections: Patient Declined (11/13/2023)  Tobacco Use: Low Risk  (11/13/2023)     Readmission Risk Interventions     No data to display

## 2023-11-14 NOTE — Care Management Obs Status (Signed)
 MEDICARE OBSERVATION STATUS NOTIFICATION   Patient Details  Name: Amy Glenn MRN: [id number] Date of Birth: 1950-10-30   Medicare Observation Status Notification Given:  Yes    Marval Gell, RN 11/14/2023, 9:24 AM

## 2023-11-15 NOTE — Progress Notes (Signed)
 Assessment [name] who is s/p C4-7 ACDF Bonney 8/27) who re-presented with transient weakness and pain which has since resolved with steroids  LOS: 0 days    Plan: Pending dispo to rehab Decadron  4q12 AAT DAT DVT ppx   Subjective: Neurologically stable  Objective: Vital signs in last 24 hours: Temp:  [98 F (36.7 C)-98.7 F (37.1 C)] 98.2 F (36.8 C) (09/01 0746) Pulse Rate:  [77-104] 83 (09/01 0746) Resp:  [16-18] 18 (09/01 0746) BP: (106-152)/(64-87) 152/87 (09/01 0746) SpO2:  [93 %-96 %] 96 % (09/01 0746)  Intake/Output from previous day: 08/31 0701 - 09/01 0700 In: 480 [P.O.:480] Out: -  Intake/Output this shift: No intake/output data recorded.  Exam: Anterior cervical incision c/d/I 5/5 BUE and BLE  Lab Results: Recent Labs    11/13/23 0842  WBC 9.4  HGB 13.9  HCT 42.1  PLT 305   BMET Recent Labs    11/13/23 0842  NA 140  K 3.8  CL 101  CO2 29  GLUCOSE 92  BUN 12  CREATININE 0.74  CALCIUM  9.0    Studies/Results: No results found.     Dorn JONELLE Glade 11/15/2023, 11:54 AM

## 2023-11-15 NOTE — Plan of Care (Signed)

## 2023-11-15 NOTE — TOC Progression Note (Addendum)
 Transition of Care [facility]) - Progression Note    Patient Details  Name: Amy Glenn MRN: [id number] Date of Birth: 01/01/1951  [facility]) CM/SW Contact  Inocente GORMAN Kindle, LCSW Phone Number: 11/15/2023, 12:01 PM  Clinical Narrative:    12pm-CSW received consult for possible SNF placement at time of discharge. CSW spoke with patient. Patient reported that patient's family is currently unable to care for patient. Patient stated that [facility] is holding a spot for her. CSW discussed insurance authorization process and will provide Medicare SNF ratings list. CSW requested [facility] review the referral. Would need insurance approval and CSW discussed that backup would be home health if insurance does not approve SNF. Also made patient aware that insurance would likely not cover ambulance transport.   2:05 PM-[facility] able to accept patient if insurance approves. CSW submitted case to [facility] for review.    Skilled Nursing Rehab Facilities-   [email or web address]   Ratings out of 5 stars (5 the highest)  Name Address  Phone # Quality Care Staffing Health Inspection Overall  [facility] [address] [phone] 2 1 2 1   [facility] [address], [place] [phone] 5 2 4 5   [facility] [address], [place] [phone] 2 1 1 1   [facility] [address], [place] [phone] 4 3 4 4   [facility]  [address], [place] [phone] 4 3 5 5   [facility] [address], [facility] [phone] 5 3 2 3   [facility] [address], [place] [phone] 5 1 2 2   [facility] [phone] [address], [place] [phone] 3 4 4 4   [address] ([place]) [address], [place] [phone] 1 3 3 2   [facility] [address] [place], [place] [phone] 4 2 2 2   [facility] ([place] [address], [place] [phone] 2 1  1 1   Clotilda Pereyra [address] [place] [phone] 2 4 4 4   [facility] [address] [place] [phone] 3 1 3 2   [facility] ([facility]) [address], [place] [phone] 1 2 4 3           [facility] [address] [place] [phone] 3 1 5 4   [facility] [address] [place] [phone] 4 2 1 1   [facility]  [address], [place] [phone] 2 4 1 1   [facility] [address] [phone] 2 2 5 5   [facility] [address] [place] [address], [place] [phone] 2 2 3 3           [facility] [address], [place] [phone] 2 1 2 1   [facility] (No [facility]) [address], [place] [phone] 4 3 4 4   [facility] [address], [facility] [phone] 3  5 5   [facility] [address], [place] [phone] 4 2 1 1   [place] [address] [place] [phone] 3 1 2 1   [facility] [address] [place] [phone] 1 3 3 2   [facility] [address], [place] [phone] 2 2 3 3   [facility] [address] [place], [place] [phone] 2 1 4 3   [facility] [address], [facility] [phone] 2 1 1 1   [facility] [address] [place], [place] [phone] 2 1 2 1   [facility] [address] [place] [phone] 3 1 2  1  [facility] [address] [place] [phone] 4 1 3 2   [place] [address], [place]  [phone] 2 4 4 4   [facility] (No Humana) [address], [place] [phone] 3 2 5 5   [facility] [address], [place] [phone] 3 2 3 3   [facility] [address] [place] [phone] 5 3 5 5   [facility] [address], [place] [phone] 2 1 1 1   [facility] [address] [place], [place] [phone] 3 5 5 5           [facility] [address] [place], [place] [phone] 5 4 5 5   [facility])  [address], [place] [phone] 1 1 2 1   [facility]) [address] [place], [place] [phone]  2 4 4   [facility] [address], [place] [phone] 3 5 5 5   [address] [place], [place] [phone] 4 2 2 2   [facility]) [address] [place] [phone] 1 1 3 1   [facility] [address], [place] [phone] 2 2 2 2       Expected Discharge Plan: Skilled Nursing Facility Barriers to Discharge: English as a second language teacher, SNF Pending bed offer, Continued Medical Work up               Expected Discharge Plan and Services In-house Referral: Clinical Social Work     Living arrangements for the past 2 months: Single Family Home                           HH Arranged: PT HH Agency: [facility] Date [facility] Agency Contacted: 11/14/23 Time HH Agency Contacted: 1000 Representative spoke with at [facility] Agency: Darle[facility]   Social Drivers of Health (SDOH) Interventions SDOH Screenings   Food Insecurity: No Food Insecurity (11/13/2023)  Housing: Low Risk  (11/13/2023)  Transportation Needs: No Transportation Needs (11/13/2023)  Utilities: Not At Risk (11/13/2023)  Social Connections: Patient Declined (11/13/2023)  Tobacco Use: Low Risk  (11/13/2023)    Readmission Risk Interventions     No data to display

## 2023-11-15 NOTE — NC FL2 (Signed)
 [place]  MEDICAID FL2 LEVEL OF CARE FORM     IDENTIFICATION  Patient Name: Amy Glenn Birthdate: [birth year] Sex: female Admission Date (Current Location): 11/13/2023  [facility] and [place] Number:  Producer, television/film/video and Address:  [facility] [place], [address], [place], [place] [address]      Provider Number: [id number]  Attending Physician Name and Address:  Louis Shove, MD  Relative Name and Phone Number:       Current Level of Care: Hospital Recommended Level of Care: Skilled Nursing Facility Prior Approval Number:    Date Approved/Denied:   PASRR Number: [id number]  Discharge Plan: SNF    Current Diagnoses: Patient Active Problem List   Diagnosis Date Noted   S/P cervical spinal fusion 11/10/2023   Fall 07/27/2023   Weakness 07/08/2023   Cognitive impairment 07/08/2023   Confusion 07/08/2023   SK (seborrheic keratosis) 07/06/2023   Personal history of other malignant neoplasm of skin 07/06/2023   Perioral dermatitis 07/06/2023   Neoplasm of uncertain behavior of skin 07/06/2023   Melanocytic nevi of trunk 07/06/2023   Hemangioma 07/06/2023   Lentigo 07/06/2023   Eczematous dermatitis of left upper eyelid 07/06/2023   Basal cell carcinoma of right upper arm 07/06/2023   Basal cell carcinoma of chest 07/06/2023   Allergic contact dermatitis of eyelid 07/06/2023   Influenza A 04/17/2023   Hypothyroidism 04/17/2023   Chest pain 10/27/2021   Constipation 10/27/2021   Diverticular disease of colon 10/27/2021   Fatty liver 10/27/2021   Internal hemorrhoids 10/27/2021   Sigmoid diverticulitis 05/30/2021   Non-intractable vomiting with nausea 10/23/2019   Abdominal cramping 10/23/2019   Abnormal liver diagnostic imaging 03/25/2018   Elevated LFTs 03/25/2018   Healthcare maintenance 03/25/2018   Irritable bowel syndrome with constipation 04/28/2013   Depression 04/28/2013   Gastro-esophageal reflux disease without esophagitis  04/28/2013   COUGH 05/17/2008    Orientation RESPIRATION BLADDER Height & Weight     Self, Time, Situation, Place  Normal Continent Weight: 191 lb 12.8 oz (87 kg) Height:  5' 7 (170.2 cm)  BEHAVIORAL SYMPTOMS/MOOD NEUROLOGICAL BOWEL NUTRITION STATUS      Continent Diet (See dc summary)  AMBULATORY STATUS COMMUNICATION OF NEEDS Skin   Supervision Verbally Surgical wounds (incision on neck)                       Personal Care Assistance Level of Assistance  Bathing, Feeding, Dressing Bathing Assistance: Limited assistance Feeding assistance: Independent Dressing Assistance: Independent     Functional Limitations Info  Sight, Hearing Sight Info: Impaired Hearing Info: Impaired      SPECIAL CARE FACTORS FREQUENCY  PT (By licensed PT), OT (By licensed OT)     PT Frequency: 5x/week OT Frequency: 5x/week            Contractures Contractures Info: Not present    Additional Factors Info  Code Status, Allergies Code Status Info: Full Allergies Info: NKA           Current Medications (11/15/2023):  This is the current hospital active medication list Current Facility-Administered Medications  Medication Dose Route Frequency Provider Last Rate Last Admin   amitriptyline  (ELAVIL ) tablet 10 mg  10 mg Oral QHS Louis Shove, MD   10 mg at 11/14/23 2212   buPROPion  ER (WELLBUTRIN  SR) 12 hr tablet 100 mg  100 mg Oral q morning Louis Shove, MD   100 mg at 11/15/23 0857   dexamethasone  (DECADRON ) injection  4 mg  4 mg Intravenous Q12H Garst, Jonathan R, MD   4 mg at 11/15/23 9141   dorzolamide -timolol  (COSOPT ) 2-0.5 % ophthalmic solution 1 drop  1 drop Both Eyes BID Louis Shove, MD   1 drop at 11/15/23 0859   enoxaparin  (LOVENOX ) injection 40 mg  40 mg Subcutaneous Daily Garst, Jonathan R, MD   40 mg at 11/15/23 0901   levothyroxine  (SYNTHROID ) tablet 75 mcg  75 mcg Oral Q0600 Louis Shove, MD   75 mcg at 11/15/23 0554   metFORMIN  (GLUCOPHAGE -XR) 24 hr tablet 500 mg  500 mg  Oral Q supper Louis Shove, MD   500 mg at 11/14/23 1646   methocarbamol  (ROBAXIN ) tablet 500 mg  500 mg Oral Q6H PRN Louis Shove, MD   500 mg at 11/14/23 2015   metoprolol  tartrate (LOPRESSOR ) injection 5 mg  5 mg Intravenous Q8H PRN Bergman, Meghan D, NP       multivitamin with minerals tablet 1 tablet  1 tablet Oral Daily Louis Shove, MD   1 tablet at 11/15/23 0900   oxyCODONE -acetaminophen  (PERCOCET) 7.5-325 MG per tablet 1 tablet  1 tablet Oral Q4H PRN Darnella Dorn SAUNDERS, MD   1 tablet at 11/15/23 0901   pantoprazole  (PROTONIX ) EC tablet 20 mg  20 mg Oral Daily Louis Shove, MD   20 mg at 11/15/23 0900   PARoxetine  (PAXIL ) tablet 20 mg  20 mg Oral Landrum Louis, Shove, MD   20 mg at 11/15/23 0900   rosuvastatin  (CRESTOR ) tablet 10 mg  10 mg Oral Daily Louis Shove, MD   10 mg at 11/15/23 0901   zolpidem  (AMBIEN ) tablet 5 mg  5 mg Oral QHS PRN Louis Shove, MD   5 mg at 11/14/23 2212     Discharge Medications: Please see discharge summary for a list of discharge medications.  Relevant Imaging Results:  Relevant Lab Results:   Additional Information SSN [id number]  Inocente GORMAN Kindle, LCSW

## 2023-11-16 DIAGNOSIS — Z981 Arthrodesis status: Secondary | ICD-10-CM | POA: Diagnosis not present

## 2023-11-16 DIAGNOSIS — Z95818 Presence of other cardiac implants and grafts: Secondary | ICD-10-CM | POA: Diagnosis not present

## 2023-11-16 DIAGNOSIS — M4802 Spinal stenosis, cervical region: Secondary | ICD-10-CM | POA: Diagnosis not present

## 2023-11-16 DIAGNOSIS — Z7984 Long term (current) use of oral hypoglycemic drugs: Secondary | ICD-10-CM | POA: Diagnosis not present

## 2023-11-16 DIAGNOSIS — K219 Gastro-esophageal reflux disease without esophagitis: Secondary | ICD-10-CM | POA: Diagnosis not present

## 2023-11-16 DIAGNOSIS — Z4789 Encounter for other orthopedic aftercare: Secondary | ICD-10-CM | POA: Diagnosis not present

## 2023-11-16 DIAGNOSIS — Z96653 Presence of artificial knee joint, bilateral: Secondary | ICD-10-CM | POA: Diagnosis not present

## 2023-11-16 DIAGNOSIS — J42 Unspecified chronic bronchitis: Secondary | ICD-10-CM | POA: Diagnosis not present

## 2023-11-16 DIAGNOSIS — M50121 Cervical disc disorder at C4-C5 level with radiculopathy: Secondary | ICD-10-CM | POA: Diagnosis not present

## 2023-11-16 DIAGNOSIS — M4722 Other spondylosis with radiculopathy, cervical region: Secondary | ICD-10-CM | POA: Diagnosis not present

## 2023-11-16 DIAGNOSIS — H9190 Unspecified hearing loss, unspecified ear: Secondary | ICD-10-CM | POA: Diagnosis not present

## 2023-11-16 DIAGNOSIS — I10 Essential (primary) hypertension: Secondary | ICD-10-CM | POA: Diagnosis not present

## 2023-11-16 DIAGNOSIS — F419 Anxiety disorder, unspecified: Secondary | ICD-10-CM | POA: Diagnosis not present

## 2023-11-16 DIAGNOSIS — M1990 Unspecified osteoarthritis, unspecified site: Secondary | ICD-10-CM | POA: Diagnosis not present

## 2023-11-16 DIAGNOSIS — E039 Hypothyroidism, unspecified: Secondary | ICD-10-CM | POA: Diagnosis not present

## 2023-11-16 DIAGNOSIS — Z9181 History of falling: Secondary | ICD-10-CM | POA: Diagnosis not present

## 2023-11-16 DIAGNOSIS — E785 Hyperlipidemia, unspecified: Secondary | ICD-10-CM | POA: Diagnosis not present

## 2023-11-16 DIAGNOSIS — F32A Depression, unspecified: Secondary | ICD-10-CM | POA: Diagnosis not present

## 2023-11-16 DIAGNOSIS — K760 Fatty (change of) liver, not elsewhere classified: Secondary | ICD-10-CM | POA: Diagnosis not present

## 2023-11-16 NOTE — TOC Transition Note (Signed)
 Transition of Care [facility]) - Discharge Note   Patient Details  Name: Amy Glenn MRN: [id number] Date of Birth: 17-Aug-1950  [facility]) CM/SW Contact:  Andrez JULIANNA George, RN Phone Number: 11/16/2023, 10:00 AM   Clinical Narrative:     Pt has decided to d/c home with home health services through [place]. Information on the AVS. [name] will contact her for the first home visit. Pt has transportation home.  Final next level of care: Home w Home Health Services Barriers to Discharge: No Barriers Identified   Patient Goals and CMS Choice Patient states their goals for this hospitalization and ongoing recovery are:: Rehab          Discharge Placement                       Discharge Plan and Services Additional resources added to the After Visit Summary for   In-house Referral: Clinical Social Work                        HH Arranged: PT, OT HH Agency[facility] Date [facility] Agency Contacted: 11/16/23 Time HH Agency Contacted: 1000 Representative spoke with at [facility] Agency: [facility]  Social Drivers of Health (SDOH) Interventions SDOH Screenings   Food Insecurity: No Food Insecurity (11/13/2023)  Housing: Low Risk  (11/13/2023)  Transportation Needs: No Transportation Needs (11/13/2023)  Utilities: Not At Risk (11/13/2023)  Social Connections: Patient Declined (11/13/2023)  Tobacco Use: Low Risk  (11/13/2023)     Readmission Risk Interventions     No data to display

## 2023-11-16 NOTE — Discharge Summary (Signed)
 Physician Discharge Summary  Patient ID: Amy Glenn MRN: [id number] DOB/AGE: 1950-05-18 73 y.o.  Admit date: 11/13/2023 Discharge date: 11/16/2023  Admission Diagnoses: post-op pain    Discharge Diagnoses: same   Discharged Condition: good  Hospital Course: The patient was admitted on 11/13/2023 with post-op pain after ACDF. The patient was taken to the floor in stable condition. The hospital course was routine. There were no complications. MRI C-spine looked good. The wound remained clean dry and intact. Pt had appropriate neck soreness. No complaints of arm pain or new N/T/W. The patient remained afebrile with stable vital signs, and tolerated a regular diet. The patient continued to increase activities, and pain was well controlled with oral pain medications. She wanted SNF or CIR at first but states today she feels better and wants to go back home.  Consults: None  Significant Diagnostic Studies:  Results for orders placed or performed during the hospital encounter of 11/13/23  CBC   Collection Time: 11/13/23  8:42 AM  Result Value Ref Range   WBC 9.4 4.0 - 10.5 K/uL   RBC 4.58 3.87 - 5.11 MIL/uL   Hemoglobin 13.9 12.0 - 15.0 g/dL   HCT 57.8 63.9 - 53.9 %   MCV 91.9 80.0 - 100.0 fL   MCH 30.3 26.0 - 34.0 pg   MCHC 33.0 30.0 - 36.0 g/dL   RDW 86.3 88.4 - 84.4 %   Platelets 305 150 - 400 K/uL   nRBC 0.0 0.0 - 0.2 %  Basic metabolic panel   Collection Time: 11/13/23  8:42 AM  Result Value Ref Range   Sodium 140 135 - 145 mmol/L   Potassium 3.8 3.5 - 5.1 mmol/L   Chloride 101 98 - 111 mmol/L   CO2 29 22 - 32 mmol/L   Glucose, Bld 92 70 - 99 mg/dL   BUN 12 8 - 23 mg/dL   Creatinine, Ser 9.25 0.44 - 1.00 mg/dL   Calcium  9.0 8.9 - 10.3 mg/dL   GFR, Estimated >39 >39 mL/min   Anion gap 10 5 - 15    MR Cervical Spine W and Wo Contrast Result Date: 11/13/2023 CLINICAL DATA:  73 year old female postoperative day 3 ACDF. Weakness, incontinence. EXAM: MRI CERVICAL SPINE  WITHOUT AND WITH CONTRAST TECHNIQUE: Multiplanar and multiecho pulse sequences of the cervical spine, to include the craniocervical junction and cervicothoracic junction, were obtained without and with intravenous contrast. CONTRAST:  8mL GADAVIST  GADOBUTROL  1 MMOL/ML IV SOLN COMPARISON:  Intraoperative images [id number]. Preoperative MRI 07/23/2023. FINDINGS: Alignment: Improved postoperative cervical lordosis. No significant spondylolisthesis, aside from mild chronic and degenerative anterolisthesis of C7 on T1. Vertebrae: ACDF hardware artifact now C4 through C7, with normal background bone marrow signal. No suspicious marrow edema or enhancement. Cord: Normal. No abnormal intradural enhancement. No dural thickening. Posterior Fossa, vertebral arteries, paraspinal tissues: Cervicomedullary junction is within normal limits. Preserved major vascular flow voids in the bilateral neck. Postoperative prevertebral and retropharyngeal space fluid or edema tracking throughout the cervical spine and just past the cervicothoracic junction (see series 3, image 12). This measures up to 5 mm in thickness above C6. At the C6 level postcontrast images do suggest a discrete fluid collection there encompassing 7 x 29 x 29 mm (AP by transverse by CC), estimated volume 3 mL. 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 (ref: J am Coll Radiol. 2015 Feb;12(2): 143-50). Otherwise small volume fluid and edema along the surgical approach from the right anterior neck  as seen on series 6, image 22. Disc levels: No cervical spinal stenosis. Mild disc bulging C2-C3 and C3-C4 is stable as detailed on the preoperative MRI. ACDF C4-C5 through C6-C7 with no spinal stenosis. Mild to moderate facet hypertrophy at C4-C5 with trace bilateral facet joint fluid. Moderate facet hypertrophy at C5-C6 greater on the right, trace facet joint fluid. The right C5 and left C6 neural foraminal patency  appears stable to improved. No visible upper thoracic spinal stenosis. IMPRESSION: C4 through C7 ACDF with expected postoperative MRI appearance; small 3 mL prevertebral hematoma/seroma overlying the C6 and C7 vertebrae. No cervical spinal stenosis. And no progressive degeneration or stenosis from the preoperative MRI. Electronically Signed   By: VEAR Hurst M.D.   On: 11/13/2023 09:54   CUP PACEART REMOTE DEVICE CHECK Result Date: 11/11/2023 ILR summary report received. Battery status OK. Normal device function. No new symptom, tachy, brady, or pause episodes. No new AF episodes. Monthly summary reports and ROV/PRN. MC, CVRS  DG Cervical Spine 1 View Result Date: 11/10/2023 CLINICAL DATA:  Surgical fusion of C4-5, C5-6 and C6-7. EXAM: DG CERVICAL SPINE - 1 VIEW; DG C-ARM 1-60 MIN-NO REPORT Radiation exposure index: 0.6083 mGy. COMPARISON:  Jul 23, 2023. FINDINGS: Two intraoperative fluoroscopic images were obtained of the cervical spine. These demonstrate the patient be status post surgical anterior fusion of C4-5, C5-6 and C6-7. IMPRESSION: Fluoroscopic guidance provided during surgical anterior fusion of C4-5, C5-6 and C6-7. Electronically Signed   By: Lynwood Landy Raddle M.D.   On: 11/10/2023 17:05   DG C-Arm 1-60 Min-No Report Result Date: 11/10/2023 Fluoroscopy was utilized by the requesting physician.  No radiographic interpretation.   DG C-Arm 1-60 Min-No Report Result Date: 11/10/2023 Fluoroscopy was utilized by the requesting physician.  No radiographic interpretation.    Antibiotics:  Anti-infectives (From admission, onward)    None       Discharge Exam: Blood pressure 112/79, pulse 89, temperature 97.6 F (36.4 C), temperature source Oral, resp. rate 16, height 5' 7 (1.702 m), weight 87 kg, SpO2 96%. Neurologic: Grossly normal Wound CDI  Discharge Medications:   Allergies as of 11/16/2023   No Known Allergies      Medication List     TAKE these medications    amitriptyline   10 MG tablet Commonly known as: ELAVIL  Take 10 mg by mouth at bedtime.   b complex vitamins capsule Take 1 capsule by mouth daily.   buPROPion  ER 100 MG 12 hr tablet Commonly known as: WELLBUTRIN  SR Take 100 mg by mouth every morning.   dorzolamide -timolol  2-0.5 % ophthalmic solution Commonly known as: COSOPT  Place 1 drop into both eyes 2 (two) times daily.   HYDROcodone -acetaminophen  10-325 MG tablet Commonly known as: NORCO Take 1 tablet by mouth every 4 (four) hours as needed for moderate pain (pain score 4-6).   lansoprazole 15 MG capsule Commonly known as: PREVACID Take 15 mg by mouth daily.   levothyroxine  75 MCG tablet Commonly known as: SYNTHROID  Take 75 mcg by mouth every morning.   metFORMIN  500 MG 24 hr tablet Commonly known as: GLUCOPHAGE -XR Take 500 mg by mouth daily with supper.   methocarbamol  500 MG tablet Commonly known as: ROBAXIN  Take 1 tablet (500 mg total) by mouth every 6 (six) hours as needed for muscle spasms.   multivitamin with minerals Tabs tablet Take 1 tablet by mouth daily.   PARoxetine  20 MG tablet Commonly known as: PAXIL  Take 20 mg by mouth every morning.   rosuvastatin  10  MG tablet Commonly known as: CRESTOR  TAKE 1 TABLET BY MOUTH AT BEDTIME   zolpidem  5 MG tablet Commonly known as: AMBIEN  Take 5 mg by mouth at bedtime.        Disposition: [facility]   Final Dx: post-op pain  Discharge Instructions     Call MD for:  difficulty breathing, headache or visual disturbances   Complete by: As directed    Call MD for:  persistant nausea and vomiting   Complete by: As directed    Call MD for:  redness, tenderness, or signs of infection (pain, swelling, redness, odor or green/yellow discharge around incision site)   Complete by: As directed    Call MD for:  severe uncontrolled pain   Complete by: As directed    Call MD for:  temperature >100.4   Complete by: As directed    Diet - low sodium heart healthy   Complete by:  As directed    Increase activity slowly   Complete by: As directed    No wound care   Complete by: As directed         Follow-up Information     Jennetta Gerard BIRCH, NP .   Specialty: Neurosurgery Contact information: [address] [place] [address] [phone]         [facility], [facility] Follow up.   Specialty: Home Health Services Contact information: [address] [place] [address] [phone]                  Signed: Alm GORMAN Molt 11/16/2023, 8:26 AM

## 2023-11-18 NOTE — Progress Notes (Signed)
 Carelink Summary Report / Loop Recorder

## 2023-11-19 ENCOUNTER — Ambulatory Visit: Payer: Self-pay | Admitting: Cardiology

## 2023-11-22 ENCOUNTER — Encounter

## 2023-11-25 NOTE — Progress Notes (Signed)
 Remote Loop Recorder Transmission

## 2023-11-30 ENCOUNTER — Encounter: Payer: Self-pay | Admitting: Neurology

## 2023-12-01 DIAGNOSIS — L820 Inflamed seborrheic keratosis: Secondary | ICD-10-CM | POA: Diagnosis not present

## 2023-12-07 DIAGNOSIS — Z0000 Encounter for general adult medical examination without abnormal findings: Secondary | ICD-10-CM | POA: Diagnosis not present

## 2023-12-07 DIAGNOSIS — M6281 Muscle weakness (generalized): Secondary | ICD-10-CM | POA: Diagnosis not present

## 2023-12-07 DIAGNOSIS — Z09 Encounter for follow-up examination after completed treatment for conditions other than malignant neoplasm: Secondary | ICD-10-CM | POA: Diagnosis not present

## 2023-12-07 DIAGNOSIS — Z23 Encounter for immunization: Secondary | ICD-10-CM | POA: Diagnosis not present

## 2023-12-07 DIAGNOSIS — E559 Vitamin D deficiency, unspecified: Secondary | ICD-10-CM | POA: Diagnosis not present

## 2023-12-07 DIAGNOSIS — E039 Hypothyroidism, unspecified: Secondary | ICD-10-CM | POA: Diagnosis not present

## 2023-12-07 DIAGNOSIS — R7309 Other abnormal glucose: Secondary | ICD-10-CM | POA: Diagnosis not present

## 2023-12-07 DIAGNOSIS — E785 Hyperlipidemia, unspecified: Secondary | ICD-10-CM | POA: Diagnosis not present
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
## 2023-12-08 ENCOUNTER — Ambulatory Visit: Attending: Cardiology | Admitting: Physician Assistant

## 2023-12-08 ENCOUNTER — Encounter: Payer: Self-pay | Admitting: Physician Assistant

## 2023-12-08 VITALS — BP 124/84 | HR 97 | Ht 67.0 in | Wt 197.7 lb

## 2023-12-08 DIAGNOSIS — Z95818 Presence of other cardiac implants and grafts: Secondary | ICD-10-CM

## 2023-12-08 DIAGNOSIS — I493 Ventricular premature depolarization: Secondary | ICD-10-CM | POA: Diagnosis not present

## 2023-12-08 DIAGNOSIS — I471 Supraventricular tachycardia, unspecified: Secondary | ICD-10-CM | POA: Diagnosis not present

## 2023-12-08 NOTE — Patient Instructions (Addendum)
 Medication Instructions:   Your physician recommends that you continue on your current medications as directed. Please refer to the Current Medication list given to you today.   *If you need a refill on your cardiac medications before your next appointment, please call your pharmacy*    Lab Work: NONE ORDERED  TODAY   If you have labs (blood work) drawn today and your tests are completely normal, you will receive your results only by: MyChart Message (if you have MyChart) OR A paper copy in the mail If you have any lab test that is abnormal or we need to change your treatment, we will call you to review the results.   Testing/Procedures: NONE ORDERED  TODAY     Follow-Up: At [facility], you and your health needs are our priority.  As part of our continuing mission to provide you with exceptional heart care, our providers are all part of one team.  This team includes your primary Cardiologist (physician) and Advanced Practice Providers or APPs (Physician Assistants and Nurse Practitioners) who all work together to provide you with the care you need, when you need it.  Your next appointment:    1 year(s)   Provider:    You may see Dr. Lawana Pray  or one of the following Advanced Practice Providers on your designated Care Team:   Mertha Abrahams, [place]    We recommend signing up for the patient portal called MyChart.  Sign up information is provided on this After Visit Summary.  MyChart is used to connect with patients for Virtual Visits (Telemedicine).  Patients are able to view lab/test results, encounter notes, upcoming appointments, etc.  Non-urgent messages can be sent to your provider as well.   To learn more about what you can do with MyChart, go to [email or web address].   Other Instructions

## 2023-12-09 DIAGNOSIS — M542 Cervicalgia: Secondary | ICD-10-CM | POA: Diagnosis not present

## 2023-12-09 DIAGNOSIS — Z6830 Body mass index (BMI) 30.0-30.9, adult: Secondary | ICD-10-CM | POA: Diagnosis not present

## 2023-12-09 DIAGNOSIS — M5412 Radiculopathy, cervical region: Secondary | ICD-10-CM | POA: Diagnosis not present

## 2023-12-13 ENCOUNTER — Ambulatory Visit

## 2023-12-13 DIAGNOSIS — I471 Supraventricular tachycardia, unspecified: Secondary | ICD-10-CM

## 2023-12-13 LAB — CUP PACEART REMOTE DEVICE CHECK
Date Time Interrogation Session: 20250928234304
Implantable Pulse Generator Implant Date: 20240601

## 2023-12-14 ENCOUNTER — Ambulatory Visit: Payer: Self-pay | Admitting: Cardiology

## 2023-12-15 NOTE — Progress Notes (Signed)
 Remote Loop Recorder Transmission

## 2023-12-16 NOTE — Progress Notes (Signed)
 Remote Loop Recorder Transmission

## 2023-12-23 ENCOUNTER — Encounter

## 2023-12-24 DIAGNOSIS — M542 Cervicalgia: Secondary | ICD-10-CM | POA: Diagnosis not present

## 2023-12-25 ENCOUNTER — Other Ambulatory Visit: Payer: Self-pay | Admitting: Interventional Cardiology

## 2023-12-28 DIAGNOSIS — M5412 Radiculopathy, cervical region: Secondary | ICD-10-CM | POA: Diagnosis not present

## 2023-12-28 DIAGNOSIS — M542 Cervicalgia: Secondary | ICD-10-CM | POA: Diagnosis not present

## 2023-12-30 DIAGNOSIS — M542 Cervicalgia: Secondary | ICD-10-CM | POA: Diagnosis not present

## 2024-01-03 ENCOUNTER — Ambulatory Visit: Attending: Pulmonary Disease | Admitting: Pulmonary Disease

## 2024-01-03 ENCOUNTER — Encounter: Payer: Self-pay | Admitting: Pulmonary Disease

## 2024-01-03 VITALS — BP 124/78 | HR 95 | Ht 67.0 in | Wt 195.8 lb

## 2024-01-03 DIAGNOSIS — I471 Supraventricular tachycardia, unspecified: Secondary | ICD-10-CM

## 2024-01-03 DIAGNOSIS — I480 Paroxysmal atrial fibrillation: Secondary | ICD-10-CM

## 2024-01-03 DIAGNOSIS — Z95818 Presence of other cardiac implants and grafts: Secondary | ICD-10-CM

## 2024-01-03 DIAGNOSIS — I493 Ventricular premature depolarization: Secondary | ICD-10-CM

## 2024-01-03 DIAGNOSIS — R002 Palpitations: Secondary | ICD-10-CM | POA: Diagnosis not present

## 2024-01-03 MED ORDER — DILTIAZEM HCL ER COATED BEADS 120 MG PO CP24: 120.0000 mg | ORAL_CAPSULE | Freq: Every day | ORAL | 3 refills | Status: DC

## 2024-01-03 NOTE — Progress Notes (Signed)
 Electrophysiology Office Note:   Date:  01/03/2024  ID:  Amy Glenn, Amy Glenn [birth year], MRN [id number]  Primary Cardiologist: None Primary Heart Failure: None Electrophysiologist: None      History of Present Illness:   Amy Glenn is a 73 y.o. female with h/o PAC's, PVC's, SVT, GERD, hypothyroidism seen today for acute visit due to ILR noting SVT and new AF.    ILR monitor alerted of an episode of SVT and new onset AF.    Patient reports she was unaware of episodes.  She denies experiencing lightheadedness or dizziness.  She reports she had an anterior neck surgery with Dr. Joshua on November 10, 2023 and is still recovering.  She walks with a cane at baseline.  She denies chest pain, palpitations, dyspnea, PND, orthopnea, nausea, vomiting, dizziness, syncope, edema, weight gain, or early satiety.   Review of systems complete and found to be negative unless listed in HPI.   EP Information / Studies Reviewed:    EKG is not ordered today. EKG from 11/16/23 reviewed which showed SR 88 bpm       Arrhythmia / AAD / Pertinent EP Studies SVT  PAC's, PVC's  Toprol  > self stopped due to fatigue  Device  MDT LNQ II implanted 08/24/22, surveillance of palpitations, near syncope  Risk Assessment/Calculations:    CHA2DS2-VASc Score = 2   This indicates a 2.2% annual risk of stroke. The patient's score is based upon: CHF History: 0 HTN History: 0 Diabetes History: 0 Stroke History: 0 Vascular Disease History: 0 Age Score: 1 Gender Score: 1              Physical Exam:   VS:  BP 124/78 (BP Location: Left Arm, Patient Position: Sitting)   Pulse 95   Ht 5' 7 (1.702 m)   Wt 195 lb 12.8 oz (88.8 kg)   SpO2 97%   BMI 30.67 kg/m    Wt Readings from Last 3 Encounters:  01/03/24 195 lb 12.8 oz (88.8 kg)  12/08/23 197 lb 11.2 oz (89.7 kg)  11/13/23 191 lb 12.8 oz (87 kg)     GEN: Pleasant, well nourished, well developed in no acute distress NECK: No JVD; No carotid  bruits CARDIAC: Regular rate and rhythm, no murmurs, rubs, gallops RESPIRATORY:  Clear to auscultation without rales, wheezing or rhonchi  ABDOMEN: Soft, non-tender, non-distended EXTREMITIES:  No edema; No deformity   ASSESSMENT AND PLAN:    Palpitations SVT PVC's/PAC's  New SCAF  CHA2DS2-VASc 2 (age / female) -ILR review shows 10 episodes of AF, longest lasting 1h, with average V rate 140 bpm and one episode of SVT lasting 52 seconds with median V rate of 207 bpm -pt previously intolerant of Toprol  due to fatigue -Begin Cardizem 120 daily for SVT episodes. -Patient encouraged to monitor blood pressure with the addition of new medication and call if she has difficulty with tolerance -Assess TSH and free T4  -AF burden is very low at this time however did discuss with patient her options in the future if her AF burden increases > catheter ablation ablation, dilt/verapamil in short term +/- flecainide > normal LVEF in 2023 -shared decision making regarding AF and risk of stroke annually, risks / benefits of OAC.  Reviewed that she effectively has a CHA2DS2-VASc of 1 and at this is a gray zone in regards to anticoagulation.  In addition her AF burden is very low and subclinical at this point.  After discussion, patient elects not to  initiate OAC which is reasonable as she has definitive monitor in place with loop recorder.  I suspect her cluster of symptoms may have been in the setting of postop /anesthesia. -pt unaware of rhythm /asymptomatic  Follow up with Dr. Inocencio / EP APP in 3 months  Signed, Daphne Barrack, NP-C, AGACNP-BC [facility] - Electrophysiology  01/03/2024, 12:25 PM

## 2024-01-03 NOTE — Patient Instructions (Addendum)
 Medication Instructions:  START TAKING CARDIZEM CD 120 MG DAILY. CONTINUE ALL OTHER MEDICATION THERAPY.  Lab Work: TSH AND FREE T4 TO BE DONE TODAY.  Testing/Procedures: NONE  Follow-Up: At [facility], you and your health needs are our priority.  As part of our continuing mission to provide you with exceptional heart care, our providers are all part of one team.  This team includes your primary Cardiologist (physician) and Advanced Practice Providers or APPs (Physician Assistants and Nurse Practitioners) who all work together to provide you with the care you need, when you need it.  Your next appointment:   3 MONTHS  Provider:   DAPHNE BARRACK, NP

## 2024-01-04 ENCOUNTER — Ambulatory Visit: Payer: Self-pay | Admitting: Pulmonary Disease

## 2024-01-04 DIAGNOSIS — M542 Cervicalgia: Secondary | ICD-10-CM | POA: Diagnosis not present

## 2024-01-04 LAB — T4, FREE: Free T4: 1.3 ng/dL (ref 0.82–1.77)

## 2024-01-04 LAB — TSH: TSH: 0.478 u[IU]/mL (ref 0.450–4.500)

## 2024-01-11 DIAGNOSIS — M542 Cervicalgia: Secondary | ICD-10-CM | POA: Diagnosis not present

## 2024-01-13 ENCOUNTER — Ambulatory Visit

## 2024-01-13 DIAGNOSIS — I471 Supraventricular tachycardia, unspecified: Secondary | ICD-10-CM

## 2024-01-13 DIAGNOSIS — M542 Cervicalgia: Secondary | ICD-10-CM | POA: Diagnosis not present

## 2024-01-13 LAB — CUP PACEART REMOTE DEVICE CHECK
Date Time Interrogation Session: 20251029233135
Implantable Pulse Generator Implant Date: 20240601

## 2024-01-14 ENCOUNTER — Ambulatory Visit: Payer: Self-pay | Admitting: Cardiology

## 2024-01-17 ENCOUNTER — Encounter: Payer: Self-pay | Admitting: Radiology

## 2024-01-17 DIAGNOSIS — K219 Gastro-esophageal reflux disease without esophagitis: Secondary | ICD-10-CM | POA: Diagnosis not present

## 2024-01-17 DIAGNOSIS — R110 Nausea: Secondary | ICD-10-CM | POA: Diagnosis not present

## 2024-01-18 ENCOUNTER — Other Ambulatory Visit: Payer: Self-pay | Admitting: Student

## 2024-01-18 DIAGNOSIS — Z9049 Acquired absence of other specified parts of digestive tract: Secondary | ICD-10-CM

## 2024-01-18 DIAGNOSIS — R110 Nausea: Secondary | ICD-10-CM

## 2024-01-18 NOTE — Progress Notes (Signed)
 Remote Loop Recorder Transmission

## 2024-01-24 ENCOUNTER — Ambulatory Visit
Admission: RE | Admit: 2024-01-24 | Discharge: 2024-01-24 | Disposition: A | Source: Ambulatory Visit | Attending: Student

## 2024-01-24 ENCOUNTER — Encounter

## 2024-01-24 DIAGNOSIS — R110 Nausea: Secondary | ICD-10-CM

## 2024-01-24 DIAGNOSIS — N200 Calculus of kidney: Secondary | ICD-10-CM | POA: Diagnosis not present

## 2024-01-24 DIAGNOSIS — Z9049 Acquired absence of other specified parts of digestive tract: Secondary | ICD-10-CM

## 2024-01-25 NOTE — Progress Notes (Unsigned)
 Initial neurology clinic note  Reason for Evaluation: Consultation requested by Amy Glenn, [place], MD for an opinion regarding lumbar radiculopathy. My final recommendations will be communicated back to the requesting physician by way of shared medical record or letter to requesting physician via US  mail.  HPI: This is [name], a 73 y.o. 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 The patient is accompanied by ***.  *** Weakness of legs? Intermittent Leg heaviness Has chronic neck pain Unsteady gait Hx of c spine surgery - present before and never got better? Has gotten injections at [facility] which helped some but now is getting weaker and worse  Never had lumbar spine imaging??  Saw Dr. 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 ***  Muscle bulk loss? *** Muscle pain? ***  Cramps/Twitching? *** Suggestion of myotonia/difficulty relaxing after contraction? ***  Fatigable weakness?*** Does strength improve after brief exercise?***  Able to brush hair/teeth without difficulty? *** Able to button shirts/use zips? *** Clumsiness/dropping grasped objects?*** Can you arise from squatted position easily? *** Able to get out of chair without using arms? *** Able to walk up steps easily? *** Use an assistive device to walk? *** Significant imbalance with walking? *** Falls?*** Any change in urine color, especially after exertion/physical activity? ***  The patient denies*** symptoms suggestive of oculobulbar weakness including diplopia, ptosis, dysphagia, poor saliva control, dysarthria/dysphonia, impaired mastication, facial weakness/droop.  There are  no*** neuromuscular respiratory weakness symptoms, particularly orthopnea>dyspnea.   Pseudobulbar affect is absent***.  The patient does not*** report symptoms referable to autonomic dysfunction including impaired sweating, heat or cold intolerance, excessive mucosal dryness, gastroparetic early satiety, postprandial abdominal bloating, constipation, bowel or bladder dyscontrol, erectile dysfunction*** or syncope/presyncope/orthostatic intolerance.  There are no*** complaints relating to other symptoms of small fiber modalities including paresthesia/pain.  The patient has not *** noticed any recent skin rashes nor does he*** report any constitutional symptoms like fever, night sweats, anorexia or unintentional weight loss.  EtOH use: ***  Restrictive diet? *** Family history of neuropathy/myopathy/NM disease?***  Previous labs, electrodiagnostics, and neuroimaging are summarized below, but pertinent findings include***  Any biopsy done? *** Current medications being tried for the patient's symptoms include ***  Prior medications that have been tried: ***   MEDICATIONS:  Outpatient Encounter Medications as of 01/27/2024  Medication Sig   amitriptyline  (ELAVIL ) 10 MG tablet Take 10 mg by mouth at bedtime.   b complex vitamins capsule Take 1 capsule by mouth daily.   buPROPion  ER (WELLBUTRIN  SR) 100 MG 12 hr tablet Take 100 mg by mouth every morning.   dorzolamide -timolol  (COSOPT ) 22.3-6.8 MG/ML ophthalmic solution Place 1 drop into both eyes 2 (two) times daily.   HYDROcodone -acetaminophen  (NORCO) 10-325 MG tablet Take 1 tablet by mouth every 4 (four) hours as needed for moderate pain (pain score 4-6).   ibuprofen  (ADVIL ) 200 MG tablet Take 200 mg by mouth as needed for moderate pain (pain score 4-6).   lansoprazole (PREVACID) 15 MG capsule Take 15 mg by mouth daily.   levothyroxine  (SYNTHROID ) 75 MCG tablet Take 75 mcg by mouth every morning.   metFORMIN  (GLUCOPHAGE -XR) 500 MG 24 hr  tablet Take 500  mg by mouth daily with supper.   methocarbamol  (ROBAXIN ) 500 MG tablet Take 1 tablet (500 mg total) by mouth every 6 (six) hours as needed for muscle spasms.   Multiple Vitamin (MULTIVITAMIN WITH MINERALS) TABS tablet Take 1 tablet by mouth daily.   PARoxetine  (PAXIL ) 20 MG tablet Take 20 mg by mouth every morning.   rosuvastatin  (CRESTOR ) 10 MG tablet TAKE 1 TABLET BY MOUTH AT BEDTIME   vitamin E  45 MG (100 UNITS) capsule Take 100 Units by mouth daily.   zolpidem  (AMBIEN ) 5 MG tablet Take 5 mg by mouth at bedtime.   No facility-administered encounter medications on file as of 01/27/2024.    PAST MEDICAL HISTORY: Past Medical History:  Diagnosis Date   Anxiety    Arthritis    Chronic bronchitis (HCC)    Depression    Dysrhythmia    Fatty liver    GERD (gastroesophageal reflux disease)    HLD (hyperlipidemia)    Hypertension    Hypothyroidism    Wears hearing aid    both ears    PAST SURGICAL HISTORY: Past Surgical History:  Procedure Laterality Date   ANTERIOR CERVICAL DECOMP/DISCECTOMY FUSION N/A 11/10/2023   Procedure: ANTERIOR CERVICAL DECOMPRESSION/DISCECTOMY FUSION CERVICAL FOUR-FIVE, CERVICAL FIVE-SIX, CERVICAL SIX-SEVEN;  Surgeon: Amy Alm Hamilton, MD;  Location: [facility];  Service: Neurosurgery;  Laterality: N/A;  ACDF - C4-C5 - C5-C6 - C6-C7   APPENDECTOMY     CHOLECYSTECTOMY  03/16/1980   DILATION AND CURETTAGE OF UTERUS  03/16/2001   fibroids   ECTOPIC PREGNANCY SURGERY  1979x2   on rt   ECTOPIC PREGNANCY SURGERY  03/16/1978   x2 left   EXPLORATORY LAPAROTOMY  10/15/1975   for fertility   HERNIA REPAIR  03/16/1993   lt LA   KNEE ARTHROSCOPY Left 07/12/2012   Procedure: LEFT KNEE ARTHROSCOPY ;  Surgeon: Amy KANDICE Herald, MD;  Location: [facility];  Service: Orthopedics;  Laterality: Left;  Left knee arthroscopy with partial medial menisectomy and chondroplasty   LAPAROSCOPIC ABDOMINAL EXPLORATION  03/16/1976   repair fall tube  scarring fertility   LOOP RECORDER IMPLANT     REPLACEMENT UNICONDYLAR JOINT KNEE Right 2010   REPLACEMENT UNICONDYLAR JOINT KNEE Left 2008   SHOULDER ARTHROSCOPY  03/17/2003   left   XI ROBOTIC ASSISTED LOWER ANTERIOR RESECTION N/A 05/30/2021   Procedure: XI ROBOTIC ASSISTED LOWER ANTERIOR RESECTION OF COLON RECTOSIGMOID, INTRAOPERATIVE ASSESSMENT OF PERFUSION USING FIREFLY, TAP BLOCK, AND RIGID PROCTOSCOPY;  Surgeon: Sheldon Standing, MD;  Location: [facility];  Service: General;  Laterality: N/A;    ALLERGIES: No Known Allergies  FAMILY HISTORY: Family History  Problem Relation Age of Onset   Hypertension Sister     SOCIAL HISTORY: Social History   Tobacco Use   Smoking status: Never   Smokeless tobacco: Never  Vaping Use   Vaping status: Never Used  Substance Use Topics   Alcohol use: Yes    Comment: occ   Drug use: No   Social History   Social History Narrative   Left handed   Caffeine- 1 cup daily   Retired, does some part time caregiving work   Previous airline pilot rep     OBJECTIVE: PHYSICAL EXAM: There were no vitals taken for this visit.  General:*** General appearance: Awake and alert. No distress. Cooperative with exam.  Skin: No obvious rash or jaundice. HEENT: Atraumatic. Anicteric. Lungs: Non-labored breathing on room air  Heart: Regular Abdomen: Soft, non tender. Extremities: No edema. No obvious  deformity.  Musculoskeletal: No obvious joint swelling. Psych: Affect appropriate.  Neurological: Mental Status: Alert. Speech fluent. No pseudobulbar affect Cranial Nerves: CNII: No RAPD. Visual fields grossly intact. CNIII, IV, VI: PERRL. No nystagmus. EOMI. CN V: Facial sensation intact bilaterally to fine touch. [place] clench strong. Jaw jerk***. CN VII: Facial muscles symmetric and strong. No ptosis at rest or after sustained upgaze***. CN VIII: Hearing grossly intact bilaterally. CN IX: No hypophonia. CN X: Palate elevates symmetrically. CN XI:  Full strength shoulder shrug bilaterally. CN XII: Tongue protrusion full and midline. No atrophy or fasciculations. No significant dysarthria*** Motor: Tone is ***. *** fasciculations in *** extremities. *** atrophy. No grip or percussive myotonia.***  Individual muscle group testing (MRC grade out of 5):  Movement     Neck flexion ***    Neck extension ***     Right Left   Shoulder abduction *** ***   Shoulder adduction *** ***   Shoulder ext rotation *** ***   Shoulder int rotation *** ***   Elbow flexion *** ***   Elbow extension *** ***   Wrist extension *** ***   Wrist flexion *** ***   Finger abduction - FDI *** ***   Finger abduction - ADM *** ***   Finger extension *** ***   Finger distal flexion - 2/3 *** ***   Finger distal flexion - 4/5 *** ***   Thumb flexion - FPL *** ***   Thumb abduction - APB *** ***    Hip flexion *** ***   Hip extension *** ***   Hip adduction *** ***   Hip abduction *** ***   Knee extension *** ***   Knee flexion *** ***   Dorsiflexion *** ***   Plantarflexion *** ***   Inversion *** ***   Eversion *** ***   Great toe extension *** ***   Great toe flexion *** ***     Reflexes:  Right Left   Bicep *** ***   Tricep *** ***   BrRad *** ***   Knee *** ***   Ankle *** ***    Pathological Reflexes: Babinski: *** response bilaterally*** Hoffman: *** Troemner: *** Pectoral: *** Palmomental: *** Facial: *** Midline tap: *** Sensation: Pinprick: *** Vibration: *** Temperature: *** Proprioception: *** Coordination: Intact finger-to- nose-finger bilaterally. Romberg negative.*** Gait: Able to rise from chair with arms crossed unassisted. Normal, narrow-based gait. Able to tandem walk. Able to walk on toes and heels.***  Lab and Test Review: Internal labs: 01/03/24: TSH wnl Free T4 wnl  11/13/23: BMP unremarkable CBC unremarkable  07/08/23: AChR binding ab negative ESR 2 CK 64 ANA negative  Lipid panel  (01/06/22): tChol 176, LDL 96, TG 111 HbA1c (05/23/21): 5.7  External labs: ***  Imaging/Procedures: MRI brain wo contrast (08/28/23): FINDINGS: No acute intracranial hemorrhage, mass, edema, or hydrocephalus. Mild cortical atrophy. No encephalomalacia. Mild white matter disease suggesting chronic small vessel ischemic change. The vascular flow voids are unremarkable. No significant sinus disease.   IMPRESSION: Mild age-related change without acute intracranial pathology.  MRI cervical spine w/wo contrast (11/13/23): FINDINGS: Alignment: Improved postoperative cervical lordosis. No significant spondylolisthesis, aside from mild chronic and degenerative anterolisthesis of C7 on T1.   Vertebrae: ACDF hardware artifact now C4 through C7, with normal background bone marrow signal. No suspicious marrow edema or enhancement.   Cord: Normal. No abnormal intradural enhancement. No dural thickening.   Posterior Fossa, vertebral arteries, paraspinal tissues: Cervicomedullary junction is within normal limits. Preserved major vascular flow voids  in the bilateral neck.   Postoperative prevertebral and retropharyngeal space fluid or edema tracking throughout the cervical spine and just past the cervicothoracic junction (see series 3, image 12). This measures up to 5 mm in thickness above C6. At the C6 level postcontrast images do suggest a discrete fluid collection there encompassing 7 x 29 x 29 mm (AP by transverse by CC), estimated volume 3 mL.   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 (ref: J am Coll Radiol. 2015 Feb;12(2): 143-50).   Otherwise small volume fluid and edema along the surgical approach from the right anterior neck as seen on series 6, image 22.   Disc levels:   No cervical spinal stenosis.   Mild disc bulging C2-C3 and C3-C4 is stable as detailed on the preoperative MRI. ACDF C4-C5 through C6-C7  with no spinal stenosis. Mild to moderate facet hypertrophy at C4-C5 with trace bilateral facet joint fluid. Moderate facet hypertrophy at C5-C6 greater on the right, trace facet joint fluid. The right C5 and left C6 neural foraminal patency appears stable to improved.   No visible upper thoracic spinal stenosis.   IMPRESSION: C4 through C7 ACDF with expected postoperative MRI appearance; small 3 mL prevertebral hematoma/seroma overlying the C6 and C7 vertebrae. No cervical spinal stenosis. And no progressive degeneration or stenosis from the preoperative MRI. ***  ASSESSMENT: [facility] is a 73 y.o. female who presents for evaluation of ***. *** has a relevant medical history of ***. *** neurological examination is pertinent for ***. Available diagnostic data is significant for ***. This constellation of symptoms and objective data would most likely localize to ***. ***  PLAN: -Blood work: *** ***  -Return to clinic ***  The impression above as well as the plan as outlined below were extensively discussed with the patient (in the company of ***) who voiced understanding. All questions were answered to their satisfaction.  The patient was counseled on pertinent fall precautions per the printed material provided today, and as noted under the Patient Instructions section below.***  When available, results of the above investigations and possible further recommendations will be communicated to the patient via telephone/MyChart. Patient to call office if not contacted after expected testing turnaround time.   Total time spent reviewing records, interview, history/exam, documentation, and coordination of care on day of encounter:  *** min   Thank you for allowing me to participate in patient's care.  If I can answer any additional questions, I would be pleased to do so.  Venetia Potters, MD   CC: Kip Righter, MD [address] [place]  [address]  CC: Referring provider: Margeret Kotyk, MD [address] [place],  [place] [address]

## 2024-01-27 ENCOUNTER — Other Ambulatory Visit

## 2024-01-27 ENCOUNTER — Ambulatory Visit: Admitting: Neurology

## 2024-01-27 ENCOUNTER — Encounter: Payer: Self-pay | Admitting: Neurology

## 2024-01-27 VITALS — BP 142/83 | HR 115 | Ht 67.0 in | Wt 195.0 lb

## 2024-01-27 DIAGNOSIS — Z981 Arthrodesis status: Secondary | ICD-10-CM

## 2024-01-27 DIAGNOSIS — R251 Tremor, unspecified: Secondary | ICD-10-CM

## 2024-01-27 DIAGNOSIS — R292 Abnormal reflex: Secondary | ICD-10-CM | POA: Diagnosis not present

## 2024-01-27 DIAGNOSIS — R258 Other abnormal involuntary movements: Secondary | ICD-10-CM | POA: Diagnosis not present

## 2024-01-27 NOTE — Patient Instructions (Signed)
 I saw you today for problems on your left side (arm and leg). I did not see clear weakness. I do wonder about parkinsonism or something that might mimic this.  I would like to do lab work today.  I want to do a special brain scan called DATSCAN to look for evidence of parkinsonism.  When I have your results, I will be in touch to discuss.  I will see you back in clinic to re-examine you in about 3 months.  Please let me know if you have any questions or concerns in the meantime.  The physicians and staff at [facility] are committed to providing excellent care. You may receive a survey requesting feedback about your experience at our office. We strive to receive very good responses to the survey questions. If you feel that your experience would prevent you from giving the office a very good  response, please contact our office to try to remedy the situation. We may be reached at [phone]. Thank you for taking the time out of your busy day to complete the survey.  Venetia Potters, MD [facility]  Preventing Falls at [facility] are common, often dreaded events in the lives of older people. Aside from the obvious injuries and even death that may result, fall can cause wide-ranging consequences including loss of independence, mental decline, decreased activity and mobility. Younger people are also at risk of falling, especially those with chronic illnesses and fatigue.  Ways to reduce risk for falling Examine diet and medications. Warm foods and alcohol dilate blood vessels, which can lead to dizziness when standing. Sleep aids, antidepressants and pain medications can also increase the likelihood of a fall.  Get a vision exam. Poor vision, cataracts and glaucoma increase the chances of falling.  Check foot gear. Shoes should fit snugly and have a sturdy, nonskid sole and a broad, low heel  Participate in a physician-approved exercise program to build and maintain muscle  strength and improve balance and coordination. Programs that use ankle weights or stretch bands are excellent for muscle-strengthening. Water  aerobics programs and low-impact Tai Chi programs have also been shown to improve balance and coordination.  Increase vitamin D  intake. Vitamin D  improves muscle strength and increases the amount of calcium  the body is able to absorb and deposit in bones.  How to prevent falls from common hazards Floors - Remove all loose wires, cords, and throw rugs. Minimize clutter. Make sure rugs are anchored and smooth. Keep furniture in its usual place.  Chairs -- Use chairs with straight backs, armrests and firm seats. Add firm cushions to existing pieces to add height.  Bathroom - Install grab bars and non-skid tape in the tub or shower. Use a bathtub transfer bench or a shower chair with a back support Use an elevated toilet seat and/or safety rails to assist standing from a low surface. Do not use towel racks or bathroom tissue holders to help you stand.  Lighting - Make sure halls, stairways, and entrances are well-lit. Install a night light in your bathroom or hallway. Make sure there is a light switch at the top and bottom of the staircase. Turn lights on if you get up in the middle of the night. Make sure lamps or light switches are within reach of the bed if you have to get up during the night.  Kitchen - Install non-skid rubber mats near the sink and stove. Clean spills immediately. Store frequently used utensils, pots, pans between waist and  eye level. This helps prevent reaching and bending. Sit when getting things out of lower cupboards.  Living room/ Bedrooms - Place furniture with wide spaces in between, giving enough room to move around. Establish a route through the living room that gives you something to hold onto as you walk.  Stairs - Make sure treads, rails, and rugs are secure. Install a rail on both sides of the stairs. If stairs are a threat, it  might be helpful to arrange most of your activities on the lower level to reduce the number of times you must climb the stairs.  Entrances and doorways - Install metal handles on the walls adjacent to the doorknobs of all doors to make it more secure as you travel through the doorway.  Tips for maintaining balance Keep at least one hand free at all times. Try using a backpack or fanny pack to hold things rather than carrying them in your hands. Never carry objects in both hands when walking as this interferes with keeping your balance.  Attempt to swing both arms from front to back while walking. This might require a conscious effort if Parkinson's disease has diminished your movement. It will, however, help you to maintain balance and posture, and reduce fatigue.  Consciously lift your feet off of the ground when walking. Shuffling and dragging of the feet is a common culprit in losing your balance.  When trying to navigate turns, use a U technique of facing forward and making a wide turn, rather than pivoting sharply.  Try to stand with your feet shoulder-length apart. When your feet are close together for any length of time, you increase your risk of losing your balance and falling.  Do one thing at a time. Don't try to walk and accomplish another task, such as reading or looking around. The decrease in your automatic reflexes complicates motor function, so the less distraction, the better.  Do not wear rubber or gripping soled shoes, they might catch on the floor and cause tripping.  Move slowly when changing positions. Use deliberate, concentrated movements and, if needed, use a grab bar or walking aid. Count 15 seconds between each movement. For example, when rising from a seated position, wait 15 seconds after standing to begin walking.  If balance is a continuous problem, you might want to consider a walking aid such as a cane, walking stick, or walker. Once you've mastered walking  with help, you might be ready to try it on your own again.

## 2024-01-30 ENCOUNTER — Encounter: Payer: Self-pay | Admitting: Neurology

## 2024-01-31 DIAGNOSIS — R945 Abnormal results of liver function studies: Secondary | ICD-10-CM | POA: Diagnosis not present

## 2024-01-31 DIAGNOSIS — R000 Tachycardia, unspecified: Secondary | ICD-10-CM | POA: Diagnosis not present

## 2024-01-31 DIAGNOSIS — R748 Abnormal levels of other serum enzymes: Secondary | ICD-10-CM | POA: Diagnosis not present

## 2024-01-31 DIAGNOSIS — R7989 Other specified abnormal findings of blood chemistry: Secondary | ICD-10-CM | POA: Diagnosis not present

## 2024-01-31 DIAGNOSIS — E162 Hypoglycemia, unspecified: Secondary | ICD-10-CM | POA: Diagnosis not present

## 2024-01-31 DIAGNOSIS — R110 Nausea: Secondary | ICD-10-CM | POA: Diagnosis not present

## 2024-01-31 DIAGNOSIS — R7401 Elevation of levels of liver transaminase levels: Secondary | ICD-10-CM | POA: Diagnosis not present

## 2024-02-03 ENCOUNTER — Ambulatory Visit: Payer: Self-pay | Admitting: Neurology

## 2024-02-03 ENCOUNTER — Encounter: Payer: Self-pay | Admitting: Neurology

## 2024-02-03 LAB — VITAMIN B12: Vitamin B-12: 1988 pg/mL — ABNORMAL HIGH (ref 200–1100)

## 2024-02-03 LAB — COPPER, SERUM: Copper: 106 ug/dL (ref 70–175)

## 2024-02-03 LAB — VITAMIN E
Gamma-Tocopherol (Vit E): <1 mg/L (ref <4.4)
Vitamin E (Alpha Tocopherol): 14.2 mg/L (ref 5.7–19.9)

## 2024-02-03 LAB — VITAMIN B1: Vitamin B1 (Thiamine): 22 nmol/L (ref 8–30)

## 2024-02-03 LAB — VITAMIN B6: Vitamin B6: 41.4 ng/mL — ABNORMAL HIGH (ref 2.1–21.7)

## 2024-02-04 ENCOUNTER — Other Ambulatory Visit: Payer: Self-pay

## 2024-02-04 ENCOUNTER — Telehealth: Payer: Self-pay

## 2024-02-04 DIAGNOSIS — R251 Tremor, unspecified: Secondary | ICD-10-CM

## 2024-02-04 NOTE — Telephone Encounter (Signed)
 Called pt and Dat Scan approved and sent Bascom a message she will calll Monday and schedule.

## 2024-02-07 ENCOUNTER — Encounter: Payer: Self-pay | Admitting: Neurology

## 2024-02-13 ENCOUNTER — Ambulatory Visit

## 2024-02-14 ENCOUNTER — Encounter

## 2024-02-14 ENCOUNTER — Telehealth: Payer: Self-pay | Admitting: Neurology

## 2024-02-14 NOTE — Telephone Encounter (Signed)
 Kathy(Self) PH: [phone]  Nathanel called in regarding Datscan. She stated that children's lugol's solution will be prescribed by provider. Nathanel also had a question about the medication that she is suppose to stop taking before Datscan. Requested a call back.

## 2024-02-15 ENCOUNTER — Ambulatory Visit

## 2024-02-15 DIAGNOSIS — I471 Supraventricular tachycardia, unspecified: Secondary | ICD-10-CM | POA: Diagnosis not present

## 2024-02-16 ENCOUNTER — Other Ambulatory Visit: Payer: Self-pay

## 2024-02-16 LAB — CUP PACEART REMOTE DEVICE CHECK
Date Time Interrogation Session: 20251201232645
Implantable Pulse Generator Implant Date: 20240601

## 2024-02-17 ENCOUNTER — Telehealth: Payer: Self-pay

## 2024-02-17 ENCOUNTER — Ambulatory Visit: Payer: Self-pay | Admitting: Cardiology

## 2024-02-17 NOTE — Telephone Encounter (Signed)
 Called Dat Scan and asked about the test and the Childrens lgols solutions is for one that can not swallow pills and has an allergy to iodine. Called pt and informed her and she understood and she has the list of meds to stop before the scan.

## 2024-02-18 NOTE — Progress Notes (Signed)
 Remote Loop Recorder Transmission

## 2024-02-24 ENCOUNTER — Ambulatory Visit: Admitting: Neurology

## 2024-02-24 ENCOUNTER — Encounter

## 2024-02-29 ENCOUNTER — Telehealth: Payer: Self-pay | Admitting: Neurology

## 2024-02-29 ENCOUNTER — Encounter (HOSPITAL_COMMUNITY): Payer: Self-pay

## 2024-02-29 ENCOUNTER — Ambulatory Visit (HOSPITAL_COMMUNITY): Admission: RE | Admit: 2024-02-29 | Discharge: 2024-02-29 | Attending: Neurology

## 2024-02-29 DIAGNOSIS — R2689 Other abnormalities of gait and mobility: Secondary | ICD-10-CM | POA: Diagnosis not present

## 2024-02-29 DIAGNOSIS — R413 Other amnesia: Secondary | ICD-10-CM | POA: Diagnosis not present

## 2024-02-29 DIAGNOSIS — R251 Tremor, unspecified: Secondary | ICD-10-CM | POA: Diagnosis present

## 2024-02-29 DIAGNOSIS — R296 Repeated falls: Secondary | ICD-10-CM | POA: Insufficient documentation

## 2024-02-29 DIAGNOSIS — R258 Other abnormal involuntary movements: Secondary | ICD-10-CM

## 2024-02-29 MED ORDER — IOFLUPANE I 123 185 MBQ/2.5ML IV SOLN
5.0000 | Freq: Once | INTRAVENOUS | Status: AC
  Administered 2024-02-29: 14:00:00 5.06 via INTRAVENOUS

## 2024-02-29 MED ORDER — CARBIDOPA-LEVODOPA 25-100 MG PO TABS: ORAL_TABLET | ORAL | 3 refills | Status: DC

## 2024-02-29 MED ORDER — POTASSIUM IODIDE (ANTIDOTE) 130 MG PO TABS
ORAL_TABLET | ORAL | Status: AC
  Filled 2024-02-29: qty 1

## 2024-02-29 NOTE — Telephone Encounter (Signed)
 Called patient to discuss results of Datscan . It was normal. Patient states that despite this result, she is having more difficulty moving and freezing. She feels very unstable. She wonders about the possibility of a Sinemet  trial. Given that she has many parkinsonism symptoms and there is not another clear diagnosis, this is reasonable. We agreed to slow up titration as follows which was sent to patient via MyChart: Start Carbidopa  Levodopa  as follows: Take 1/2 tablet three times daily, at least 30 minutes before meals (approximately 7am/11am/4pm), for one week Then take 1/2 tablet in the morning, 1/2 tablet in the afternoon, 1 tablet in the evening, at least 30 minutes before meals, for one week Then take 1/2 tablet in the morning, 1 tablet in the afternoon, 1 tablet in the evening, at least 30 minutes before meals, for one week Then take 1 tablet three times daily at 7am/11am/4pm, at least 30 minutes before meals   As a reminder, carbidopa /levodopa  can be taken at the same time as a carbohydrate, but we like to have you take your pill either 30 minutes before a protein source or 1 hour after as protein can interfere with carbidopa /levodopa  absorption.  Common side effects (eg, nausea, somnolence, headache) are less likely with low starting doses and slow titration and tend to resolve over time.   Patient is aware that Sinemet  may not help, particularly if she does not have parkinson's disease. I considered an etiology such as neuropathy, but her exam does not well fit this. 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 Labs looking for mimics has also been negative.  I will arrange sooner follow up with patient and monitor on Sinemet  trial for now.  All questions were answered.  Venetia Potters, MD [facility]

## 2024-03-03 ENCOUNTER — Other Ambulatory Visit: Payer: Self-pay | Admitting: Medical Genetics

## 2024-03-10 ENCOUNTER — Encounter: Payer: Self-pay | Admitting: Family Medicine

## 2024-03-10 ENCOUNTER — Ambulatory Visit: Admitting: Family Medicine

## 2024-03-10 VITALS — BP 118/72 | HR 83 | Temp 97.5°F | Ht 67.0 in | Wt 196.4 lb

## 2024-03-10 DIAGNOSIS — K760 Fatty (change of) liver, not elsewhere classified: Secondary | ICD-10-CM

## 2024-03-10 DIAGNOSIS — R42 Dizziness and giddiness: Secondary | ICD-10-CM | POA: Diagnosis not present

## 2024-03-10 DIAGNOSIS — E669 Obesity, unspecified: Secondary | ICD-10-CM

## 2024-03-10 DIAGNOSIS — R29818 Other symptoms and signs involving the nervous system: Secondary | ICD-10-CM | POA: Diagnosis not present

## 2024-03-10 DIAGNOSIS — Z950 Presence of cardiac pacemaker: Secondary | ICD-10-CM | POA: Diagnosis not present

## 2024-03-10 DIAGNOSIS — I4891 Unspecified atrial fibrillation: Secondary | ICD-10-CM | POA: Diagnosis not present

## 2024-03-10 DIAGNOSIS — Z6830 Body mass index (BMI) 30.0-30.9, adult: Secondary | ICD-10-CM | POA: Diagnosis not present

## 2024-03-10 DIAGNOSIS — Z9889 Other specified postprocedural states: Secondary | ICD-10-CM | POA: Diagnosis not present

## 2024-03-10 DIAGNOSIS — R635 Abnormal weight gain: Secondary | ICD-10-CM

## 2024-03-10 DIAGNOSIS — E039 Hypothyroidism, unspecified: Secondary | ICD-10-CM

## 2024-03-10 NOTE — Progress Notes (Signed)
 "    Diagnoses and Orders:   1. Hepatic steatosis   2. Weight gain   3. Parkinsonian features   4. Episodic lightheadedness   5. Hypothyroidism, unspecified type   6. Class 1 obesity with serious comorbidity and body mass index (BMI) of 30.0 to 30.9 in adult, unspecified obesity type    Assessment & Plan:   Assessment & Plan Tremor and possible Parkinson's disease Under treatment for possible Parkinson's with medication despite normal DAT scan. Improvement in tremor and balance noted. Neurologist Dr. Leigh involved in care. - Continue current Parkinson's medication regimen with gradual dose increase. - Coordinate care with Dr. Leigh for ongoing management and potential alternative diagnoses.  Obesity Struggling with weight management. Interested in Albia for weight loss and inflammation reduction. Insurance changes may allow coverage starting January 1st. - Consider starting Tzhncb for weight loss after January 1st, pending insurance coverage. - Discuss potential benefits of Wegovy, including weight loss and inflammation reduction.  Presyncopal episodes Experienced hypoglycemic episode likely due to exertion and inadequate food intake. Metformin  stabilizes blood sugars but may not suffice for weight management. - Continue metformin  for blood sugar stabilization. - Monitor blood sugar levels, especially during physical activity. - Consider [phone] for additional benefits in weight management and inflammation reduction.  Fatty liver disease Potential benefits of weight loss medication for fatty liver disease discussed. No current FDA-approved indication for Parkinson's, but research is ongoing. - Consider [phone] for potential benefits in managing fatty liver disease.  Atrial fibrillation with pacemaker Pacemaker in place. Recent surgery may have contributed to transient AFib. Current heart rate in the 80s with occasional irregular beats, no significant arrhythmias noted. - Continue  monitoring heart rate and rhythm. - Ensure adequate hydration and electrolyte balance.  Autoimmune hypothyroidism Thyroid  function well-managed with current levothyroxine  dosage. - Continue current levothyroxine  dosage.  History of lumbar disc surgery Underwent lumbar disc surgery with improvement in balance and mobility. Physical therapy not beneficial post-surgery. - Continue current management and monitor for any changes in symptoms.  General Health Maintenance Regular monitoring and follow-up ongoing. - Continue regular health maintenance and follow-up with healthcare providers.   Geni Shutter, DO, MS, FAAFP, Dipl. [facility] [address] [place], [address] Dept: [phone] Dept Fax: [phone]  Subjective:   History of Present Illness Amy Glenn is a 73 year old female who presents for follow-up on her medication and weight management.  Parkinsonian symptoms and gait disturbance - On treatment for parkinsonian symptoms with partial improvement - Resolution of prior left arm tremor - Currently titrating medication dose without nausea - DAT scan normal; diagnosis remains unclear - History of lumbar disc surgery after extensive balance and gait physical therapy with minimal preoperative benefit - Postoperative improvement in walking; sometimes ambulates without a cane but usually uses it for safety  Weight gain and obesity - Weight increased by 7-10 lb since August; current weight 196 lb, BMI 30 - Difficulty losing weight despite high-protein, fruit- and vegetable-based diet - Perceives weight as contributing to worsening walking and balance - Feels weight is stuck and expresses interest in weight loss medication - Associates Paxil  with weight gain but reluctant to change medication  Hypoglycemia and glucose management - Recurrent episodes of low blood sugar - Attributes a recent fall in a parking  lot to hypoglycemia rather than parkinsonian symptoms - Carries glucose tablets and monitors glucose regularly - On metformin , which stabilizes glucose levels but occasional hypoglycemic episodes persist  Fatigue and sleep disturbance - Significant fatigue, sometimes sleeping up to 12 hours  Cardiac symptoms - Intermittent irregular heartbeats  Review of Systems: Negative, with the exception of above mentioned in HPI.  History:   Reviewed by clinician on day of visit: allergies, medications, problem list, medical history, surgical history, family history, social history, and previous encounter notes.    Medications:   Show/hide medication list[1] Allergies[2]  Objective:   BP 118/72   Pulse 83   Temp (!) 97.5 F (36.4 C) (Temporal)   Ht 5' 7 (1.702 m)   Wt 196 lb 6.4 oz (89.1 kg)   SpO2 96%   BMI 30.76 kg/m    Physical Exam Constitutional:      General: She is not in acute distress.    Appearance: She is well-developed.  HENT:     Head: Normocephalic and atraumatic.  Eyes:     Conjunctiva/sclera: Conjunctivae normal.  Cardiovascular:     Rate and Rhythm: Normal rate and regular rhythm.     Pulses: Normal pulses.     Heart sounds: Normal heart sounds.  Pulmonary:     Effort: Pulmonary effort is normal.     Breath sounds: Normal breath sounds.  Neurological:     Mental Status: She is alert.     Motor: Tremor present.     Gait: Gait abnormal.  Psychiatric:        Behavior: Behavior normal.        Results for orders placed or performed in visit on 02/15/24  CUP PACEART REMOTE DEVICE CHECK   Collection Time: 02/14/24 11:26 PM  Result Value Ref Range   Date Time Interrogation Session [phone]    Pulse Generator Manufacturer [employer]    Pulse Gen Model LNQ22 LINQ II    Pulse Gen Serial Number [id number]    Clinic Name [facility]    Implantable Pulse Generator Type ICM/ILR    Implantable Pulse Generator Implant Date [id number]     Attestations:    Patient is well-known to me from previous care setting and is establishing care in this system with me as PCP. Available records reviewed. Chart updated today with reconciliation of problem list, medications, allergies, and relevant history. Preventive care and chronic disease status reviewed.  Outside labs reviewed and will be abstracted. Portions of historical chart may remain incomplete; will update on an ongoing basis as clinically indicated.  As the patient's primary care physician, board-certified in Family Medicine and Obesity Medicine, I am providing ongoing, comprehensive obesity care based on the pillars of obesity medicine, including nutrition therapy, physical activity, behavioral modification, and pharmacologic treatment.    [1]  Outpatient Medications Prior to Visit  Medication Sig   amitriptyline  (ELAVIL ) 10 MG tablet Take 10 mg by mouth at bedtime.   b complex vitamins capsule Take 1 capsule by mouth daily.   buPROPion  ER (WELLBUTRIN  SR) 100 MG 12 hr tablet Take 100 mg by mouth every morning.   carbidopa -levodopa  (SINEMET  IR) 25-100 MG tablet Take 1/2 tablet three times daily, at least 30 minutes before meals (approximately 7am/11am/4pm) for one week, then take 1/2 tablet in the morning, 1/2 tablet in the afternoon, 1 tablet in the evening for one week, then take 1/2 tablet in the morning, 1 tablet in the afternoon, 1 tablet in the evening for one week, then take 1 tablet three times daily thereafter   dorzolamide -timolol  (COSOPT ) 22.3-6.8 MG/ML ophthalmic solution Place 1 drop into both eyes 2 (two) times daily.   ibuprofen  (ADVIL )  200 MG tablet Take 200 mg by mouth as needed for moderate pain (pain score 4-6).   JOURNAVX 50 MG TABS Take by mouth.   lansoprazole (PREVACID) 15 MG capsule Take 15 mg by mouth daily.   levothyroxine  (SYNTHROID ) 75 MCG tablet Take 75 mcg by mouth every morning.   metFORMIN  (GLUCOPHAGE -XR) 500 MG 24 hr tablet Take 500 mg by mouth daily with supper.    Multiple Vitamin (MULTIVITAMIN WITH MINERALS) TABS tablet Take 1 tablet by mouth daily.   PARoxetine  (PAXIL ) 20 MG tablet Take 20 mg by mouth every morning.   rosuvastatin  (CRESTOR ) 10 MG tablet TAKE 1 TABLET BY MOUTH AT BEDTIME   vitamin E  45 MG (100 UNITS) capsule Take 100 Units by mouth daily.   zolpidem  (AMBIEN ) 5 MG tablet Take 5 mg by mouth at bedtime. (Patient taking differently: Take 5 mg by mouth as needed. At bed time as needed)   [DISCONTINUED] HYDROcodone -acetaminophen  (NORCO) 10-325 MG tablet Take 1 tablet by mouth every 4 (four) hours as needed for moderate pain (pain score 4-6).   [DISCONTINUED] methocarbamol  (ROBAXIN ) 500 MG tablet Take 1 tablet (500 mg total) by mouth every 6 (six) hours as needed for muscle spasms.   No facility-administered medications prior to visit.  [2] No Known Allergies  "

## 2024-03-13 ENCOUNTER — Encounter: Payer: Self-pay | Admitting: Neurology

## 2024-03-14 NOTE — Telephone Encounter (Signed)
  CLINICAL USE BELOW THIS LINE (use X to signify action taken)  ___ Form received and placed in providers office for signature. ___ Form completed and faxed to LOA Dept.  _X__ Form completed & LVM to notify patient ready for pick up.  ___ Charge sheet and copy of form in front office folder for office supervisor.

## 2024-03-15 ENCOUNTER — Encounter: Payer: Self-pay | Admitting: Family Medicine

## 2024-03-15 ENCOUNTER — Ambulatory Visit

## 2024-03-15 DIAGNOSIS — C44519 Basal cell carcinoma of skin of other part of trunk: Secondary | ICD-10-CM | POA: Insufficient documentation

## 2024-03-15 DIAGNOSIS — F419 Anxiety disorder, unspecified: Secondary | ICD-10-CM | POA: Insufficient documentation

## 2024-03-15 DIAGNOSIS — E559 Vitamin D deficiency, unspecified: Secondary | ICD-10-CM | POA: Insufficient documentation

## 2024-03-15 DIAGNOSIS — E785 Hyperlipidemia, unspecified: Secondary | ICD-10-CM | POA: Insufficient documentation

## 2024-03-15 DIAGNOSIS — H409 Unspecified glaucoma: Secondary | ICD-10-CM | POA: Insufficient documentation

## 2024-03-15 DIAGNOSIS — I700 Atherosclerosis of aorta: Secondary | ICD-10-CM | POA: Insufficient documentation

## 2024-03-15 DIAGNOSIS — C44612 Basal cell carcinoma of skin of right upper limb, including shoulder: Secondary | ICD-10-CM | POA: Insufficient documentation

## 2024-03-15 DIAGNOSIS — Z860100 Personal history of colon polyps, unspecified: Secondary | ICD-10-CM | POA: Insufficient documentation

## 2024-03-15 DIAGNOSIS — G4700 Insomnia, unspecified: Secondary | ICD-10-CM | POA: Insufficient documentation

## 2024-03-15 DIAGNOSIS — G43909 Migraine, unspecified, not intractable, without status migrainosus: Secondary | ICD-10-CM | POA: Insufficient documentation

## 2024-03-16 ENCOUNTER — Encounter

## 2024-03-17 ENCOUNTER — Ambulatory Visit: Attending: Cardiology

## 2024-03-17 DIAGNOSIS — I471 Supraventricular tachycardia, unspecified: Secondary | ICD-10-CM | POA: Diagnosis not present

## 2024-03-17 LAB — CUP PACEART REMOTE DEVICE CHECK
Date Time Interrogation Session: 20260101231936
Implantable Pulse Generator Implant Date: 20240601

## 2024-03-18 ENCOUNTER — Ambulatory Visit: Payer: Self-pay | Admitting: Cardiology

## 2024-03-20 NOTE — Progress Notes (Signed)
 Amy Glenn                                          MRN: [id number]   03/20/2024   The [facility] Quality Team Specialist reviewed this patient medical record for the purposes of chart review for care gap closure. The following were reviewed: chart review for care gap closure-kidney health evaluation for diabetes:eGFR  and uACR.    [facility] Quality Team

## 2024-03-22 NOTE — Progress Notes (Signed)
 Remote Loop Recorder Transmission

## 2024-03-23 ENCOUNTER — Other Ambulatory Visit: Payer: Self-pay | Admitting: Nurse Practitioner

## 2024-03-24 ENCOUNTER — Encounter: Payer: Self-pay | Admitting: Family Medicine

## 2024-03-24 ENCOUNTER — Ambulatory Visit: Admitting: Family Medicine

## 2024-03-24 VITALS — BP 112/74 | HR 103 | Ht 67.0 in | Wt 193.0 lb

## 2024-03-24 DIAGNOSIS — R4189 Other symptoms and signs involving cognitive functions and awareness: Secondary | ICD-10-CM

## 2024-03-24 DIAGNOSIS — E063 Autoimmune thyroiditis: Secondary | ICD-10-CM

## 2024-03-24 DIAGNOSIS — E66811 Obesity, class 1: Secondary | ICD-10-CM

## 2024-03-24 DIAGNOSIS — Z6830 Body mass index (BMI) 30.0-30.9, adult: Secondary | ICD-10-CM

## 2024-03-24 DIAGNOSIS — F4323 Adjustment disorder with mixed anxiety and depressed mood: Secondary | ICD-10-CM | POA: Diagnosis not present

## 2024-03-24 DIAGNOSIS — T50905A Adverse effect of unspecified drugs, medicaments and biological substances, initial encounter: Secondary | ICD-10-CM | POA: Diagnosis not present

## 2024-03-24 DIAGNOSIS — Z006 Encounter for examination for normal comparison and control in clinical research program: Secondary | ICD-10-CM

## 2024-03-24 LAB — COMPREHENSIVE METABOLIC PANEL WITH GFR
ALT: 27 U/L (ref 3–35)
AST: 100 U/L — ABNORMAL HIGH (ref 5–37)
Albumin: 4.2 g/dL (ref 3.5–5.2)
Alkaline Phosphatase: 55 U/L (ref 39–117)
BUN: 13 mg/dL (ref 6–23)
CO2: 29 meq/L (ref 19–32)
Calcium: 9.2 mg/dL (ref 8.4–10.5)
Chloride: 103 meq/L (ref 96–112)
Creatinine, Ser: 0.76 mg/dL (ref 0.40–1.20)
GFR: 78.44 mL/min
Glucose, Bld: 106 mg/dL — ABNORMAL HIGH (ref 70–99)
Potassium: 3.8 meq/L (ref 3.5–5.1)
Sodium: 141 meq/L (ref 135–145)
Total Bilirubin: 0.4 mg/dL (ref 0.2–1.2)
Total Protein: 6.6 g/dL (ref 6.0–8.3)

## 2024-03-24 LAB — CBC WITH DIFFERENTIAL/PLATELET
Basophils Absolute: 0.1 K/uL (ref 0.0–0.1)
Basophils Relative: 0.9 % (ref 0.0–3.0)
Eosinophils Absolute: 0.2 K/uL (ref 0.0–0.7)
Eosinophils Relative: 3.3 % (ref 0.0–5.0)
HCT: 43.8 % (ref 36.0–46.0)
Hemoglobin: 14.8 g/dL (ref 12.0–15.0)
Lymphocytes Relative: 27.9 % (ref 12.0–46.0)
Lymphs Abs: 1.6 K/uL (ref 0.7–4.0)
MCHC: 33.6 g/dL (ref 30.0–36.0)
MCV: 87.9 fl (ref 78.0–100.0)
Monocytes Absolute: 0.4 K/uL (ref 0.1–1.0)
Monocytes Relative: 7.2 % (ref 3.0–12.0)
Neutro Abs: 3.4 K/uL (ref 1.4–7.7)
Neutrophils Relative %: 60.7 % (ref 43.0–77.0)
Platelets: 298 K/uL (ref 150.0–400.0)
RBC: 4.99 Mil/uL (ref 3.87–5.11)
RDW: 14 % (ref 11.5–15.5)
WBC: 5.7 K/uL (ref 4.0–10.5)

## 2024-03-24 LAB — T4, FREE: Free T4: 0.68 ng/dL (ref 0.60–1.60)

## 2024-03-24 LAB — TSH: TSH: 2.63 u[IU]/mL (ref 0.35–5.50)

## 2024-03-24 MED ORDER — BUPROPION HCL ER (XL) 150 MG PO TB24: 150.0000 mg | ORAL_TABLET | Freq: Every day | ORAL | 1 refills | Status: AC

## 2024-03-24 MED ORDER — ONDANSETRON 4 MG PO TBDP: 4.0000 mg | ORAL_TABLET | Freq: Three times a day (TID) | ORAL | 0 refills | Status: AC | PRN

## 2024-03-24 NOTE — Progress Notes (Signed)
 "  NEUROLOGY FOLLOW UP OFFICE NOTE  Amy Glenn [id number]  Subjective:  Amy Glenn is a 74 y.o. 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  To briefly review: 01/27/24: Patient's symptoms started about 1 year ago. She was out walking and felt she wasn't walking right, like they were not working like they should. She thinks she was shuffling. She denies freezing. This got worse over time. She felt like she couldn't walk far. She has to stop and rest then she can go further. She mentions that when she would be walking in her house, she gets close to her chair and it is like her legs stop working. She felt like her left side was weak (arm and leg). She denies numbness, tingling, or pain. She has occasional low back pain but denies it running into legs. She had neck pain prior to her cervical spine disease. She had ACDF C4-7 by Dr. Joshua about 11 weeks ago. Her neck pain has not improved. Dr. Joshua did not have a good explanation for leg weakness, so she was referred to neurology.   She also endorses imbalance. She has fallen, about 2 times per month. It is usually when she bends over, she will fall forward.   She endorses good sense of smell. She has noticed some tremors in her left hand when trying to eat. Her handwriting is more messy, but not smaller. She sleeps alone, but does not endorse any REM sleep disorder concerns.   She denies cramps or twitching.   The patient denies symptoms suggestive of oculobulbar weakness including diplopia, ptosis, dysphagia, poor saliva control, dysarthria/dysphonia, impaired mastication, facial weakness/droop.   There are no neuromuscular respiratory weakness symptoms, particularly orthopnea>dyspnea.    Pseudobulbar affect is absent.   The patient has not  noticed any recent skin rashes nor does she report any constitutional symptoms like fever, night sweats, anorexia or unintentional weight loss.   Of note, patient saw Dr. 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 Per notes, there was concern for MCI due to MoCA on 06/2023 was 25/30. Patient feels she is stable in terms of cognition. Patient mentions she was repeatedly doing televisits and was not offered much, so she did not want to continue there.   Of note, patient is on B Complex and vit E. She used to take zinc .   EtOH use: No  Restrictive diet? No Family history of neurologic disease? No   Patient's daughter-in-law thinks patient has Parkinson's disease, so this is something patient is worried about.  Most recent Assessment and Plan (01/27/24): Amy Glenn is a 74 y.o. female who presents for evaluation of change in ability to walk, which patient describes as left sided weakness. 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  Her neurologic examination shows no clear left sided weakness or sensory deficit, and reflexes are normal to mildly increased. She has mild bradykinesia as well. Looking back at cervical spine imaging from prior to cervical spine surgery, there is no cervical stenosis to explain findings. Similarly, MRI brain does not well explain these findings. While findings are subtle, there may be findings consistent with parkinsonism (query idiopathic Parkinson's disease). I will check labs to  look for potential mimics and get a Datscan  to further evaluate. I discussed my concerns with patient who agreed to work up below.   PLAN: -Labwork: B1, B12, B6, vit E, copper  -Datscan   Since their last visit: Labs showed an elevated B6 and B12. I suggested she stop her B complex. She has stopped this.  Per my phone note on 02/29/24: Called patient to  discuss results of Datscan . It was normal. Patient states that despite this result, she is having more difficulty moving and freezing. She feels very unstable. She wonders about the possibility of a Sinemet  trial. Given that she has many parkinsonism symptoms and there is not another clear diagnosis, this is reasonable. We agreed to slow up titration as follows which was sent to patient via MyChart: Start Carbidopa  Levodopa  as follows: Take 1/2 tablet three times daily, at least 30 minutes before meals (approximately 7am/11am/4pm), for one week Then take 1/2 tablet in the morning, 1/2 tablet in the afternoon, 1 tablet in the evening, at least 30 minutes before meals, for one week Then take 1/2 tablet in the morning, 1 tablet in the afternoon, 1 tablet in the evening, at least 30 minutes before meals, for one week Then take 1 tablet three times daily at 7am/11am/4pm, at least 30 minutes before meals   As a reminder, carbidopa /levodopa  can be taken at the same time as a carbohydrate, but we like to have you take your pill either 30 minutes before a protein source or 1 hour after as protein can interfere with carbidopa /levodopa  absorption.   Common side effects (eg, nausea, somnolence, headache) are less likely with low starting doses and slow titration and tend to resolve over time.   Patient is aware that Sinemet  may not help, particularly if she does not have parkinson's disease. I considered an etiology such as neuropathy, but her exam does not well fit this. 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 Labs looking for mimics has also been negative.   I will arrange sooner follow up with patient and monitor on Sinemet  trial for now.  Patient is taking Sinemet . She thinks it is helping her a little. She isn't sure it is working, but is afraid to come off of it. She thinks it took away the tremor in her shoulder. She thinks  it is helping her walk better. She still freezes some. She walks with a cane.  The sinemet  makes her tired in the morning. She does better in the afternoons. She takes the Sinemet  at 7a/11am/4pm (1 tablet TID). She thinks the medication starts working after about 30 minutes. She does not clearly see a wearing off.  Patient is having more tremors in her hands. She feels like her hands are weak as well. She denies any numbness or tingling of her arms or hands.  She denies any EtOH use (AST 100, ALT 27).  MEDICATIONS:  Outpatient Encounter Medications as of 04/05/2024  Medication Sig   amitriptyline  (ELAVIL ) 10 MG tablet Take 10 mg by mouth at bedtime.   buPROPion  (WELLBUTRIN  XL) 150 MG 24 hr tablet Take 1 tablet (150 mg total) by mouth daily.   b complex vitamins capsule Take 1 capsule by mouth daily. (Patient not taking: Reported on 04/05/2024)   carbidopa -levodopa  (SINEMET  IR) 25-100 MG tablet Take 1 tablet at 7 am, 1 tablet at 11 am, 1.5 tablets at 4 pm for 1 week then, 1 tablet at 7 am, 1.5  tablets at 11 am, 1.5 tablets at 4 pm for 1 week, then 1.5 tablets three times daily (7 am, 11 am, 4 pm)   dorzolamide -timolol  (COSOPT ) 22.3-6.8 MG/ML ophthalmic solution Place 1 drop into both eyes 2 (two) times daily.   ibuprofen  (ADVIL ) 200 MG tablet Take 200 mg by mouth as needed for moderate pain (pain score 4-6).   JOURNAVX 50 MG TABS Take by mouth.   lansoprazole (PREVACID) 15 MG capsule Take 15 mg by mouth daily.   levothyroxine  (SYNTHROID ) 75 MCG tablet Take 75 mcg by mouth every morning.   metFORMIN  (GLUCOPHAGE -XR) 500 MG 24 hr tablet Take 500 mg by mouth daily with supper.   Multiple Vitamin (MULTIVITAMIN WITH MINERALS) TABS tablet Take 1 tablet by mouth daily.   ondansetron  (ZOFRAN -ODT) 4 MG disintegrating tablet Take 1 tablet (4 mg total) by mouth every 8 (eight) hours as needed for nausea or vomiting.   PARoxetine  (PAXIL ) 20 MG tablet Take 20 mg by mouth every morning.   rosuvastatin   (CRESTOR ) 10 MG tablet TAKE 1 TABLET BY MOUTH EVERY DAY AT BEDTIME   vitamin E  45 MG (100 UNITS) capsule Take 100 Units by mouth daily.   zolpidem  (AMBIEN ) 5 MG tablet Take 5 mg by mouth at bedtime. (Patient taking differently: Take 5 mg by mouth as needed. At bed time as needed)   [DISCONTINUED] carbidopa -levodopa  (SINEMET  IR) 25-100 MG tablet Take 1/2 tablet three times daily, at least 30 minutes before meals (approximately 7am/11am/4pm) for one week, then take 1/2 tablet in the morning, 1/2 tablet in the afternoon, 1 tablet in the evening for one week, then take 1/2 tablet in the morning, 1 tablet in the afternoon, 1 tablet in the evening for one week, then take 1 tablet three times daily thereafter   No facility-administered encounter medications on file as of 04/05/2024.    PAST MEDICAL HISTORY: Past Medical History:  Diagnosis Date   Anxiety    Arrhythmia    Arthritis    Chronic bronchitis (HCC)    Depression    Dysrhythmia    Fatty liver    GERD (gastroesophageal reflux disease)    HLD (hyperlipidemia)    Hypertension    Hypothyroidism    Wears hearing aid    both ears    PAST SURGICAL HISTORY: Past Surgical History:  Procedure Laterality Date   ANTERIOR CERVICAL DECOMP/DISCECTOMY FUSION N/A 11/10/2023   Procedure: ANTERIOR CERVICAL DECOMPRESSION/DISCECTOMY FUSION CERVICAL FOUR-FIVE, CERVICAL FIVE-SIX, CERVICAL SIX-SEVEN;  Surgeon: Joshua Alm Hamilton, MD;  Location: [facility] OR;  Service: Neurosurgery;  Laterality: N/A;  ACDF - C4-C5 - C5-C6 - C6-C7   APPENDECTOMY     CHOLECYSTECTOMY  03/16/1980   DILATION AND CURETTAGE OF UTERUS  03/16/2001   fibroids   ECTOPIC PREGNANCY SURGERY  1979x2   on rt   ECTOPIC PREGNANCY SURGERY  03/16/1978   x2 left   EXPLORATORY LAPAROTOMY  10/15/1975   for fertility   HERNIA REPAIR  03/16/1993   lt LA   KNEE ARTHROSCOPY Left 07/12/2012   Procedure: LEFT KNEE ARTHROSCOPY ;  Surgeon: Maude KANDICE Herald, MD;  Location: [facility];   Service: Orthopedics;  Laterality: Left;  Left knee arthroscopy with partial medial menisectomy and chondroplasty   LAPAROSCOPIC ABDOMINAL EXPLORATION  03/16/1976   repair fall tube scarring fertility   LOOP RECORDER IMPLANT     REPLACEMENT UNICONDYLAR JOINT KNEE Right 2010   REPLACEMENT UNICONDYLAR JOINT KNEE Left 2008   SHOULDER ARTHROSCOPY  03/17/2003   left  XI ROBOTIC ASSISTED LOWER ANTERIOR RESECTION N/A 05/30/2021   Procedure: XI ROBOTIC ASSISTED LOWER ANTERIOR RESECTION OF COLON RECTOSIGMOID, INTRAOPERATIVE ASSESSMENT OF PERFUSION USING FIREFLY, TAP BLOCK, AND RIGID PROCTOSCOPY;  Surgeon: Sheldon Standing, MD;  Location: [facility] ORS;  Service: General;  Laterality: N/A;    ALLERGIES: Allergies[1]  FAMILY HISTORY: Family History  Problem Relation Age of Onset   Healthy Mother    Healthy Father    Hypertension Sister     SOCIAL HISTORY: Social History[2] Social History   Social History Narrative   Left handed   Caffeine- 1 cup daily   Retired, 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 No distress. Cooperative with exam.  Skin: No obvious rash or jaundice. HEENT: Atraumatic. Anicteric. Lungs: Non-labored breathing on room air  Heart: Regular Extremities: No edema.  Psych: Affect appropriate.  Neurological: Mental Status: Alert. Speech fluent. No pseudobulbar affect Cranial Nerves: CNII: No RAPD. Visual fields intact. CNIII, IV, VI: PERRL. No nystagmus. EOMI. CN V: Facial sensation intact bilaterally to fine touch. [place] clench strong. Jaw jerk is negative. CN VII: Facial muscles symmetric and strong. No ptosis at rest. CN VIII: Hears finger rub well bilaterally. CN IX: No hypophonia. CN X: Palate elevates symmetrically. CN XI: Full strength shoulder  shrug bilaterally. CN XII: Tongue protrusion full and midline. No atrophy or fasciculations. No significant dysarthria Motor: Tone is normal.  Individual muscle group testing (MRC grade out of 5):  Movement     Neck flexion 5    Neck extension 5     Right Left   Shoulder abduction 5 5   Elbow flexion 5 5   Elbow extension 5 5   Finger abduction - FDI 3 5   Finger abduction - ADM 5- 5   Finger extension 5 5   Finger distal flexion - 2/3 5 5    Finger distal flexion - 4/5 5- 5   Thumb flexion - FPL 5 5   Thumb abduction - APB 5 5    Hip flexion 5 5   Hip extension 5 5   Hip adduction 5 5   Hip abduction 5 5   Knee extension 5 5   Knee flexion 5 5   Dorsiflexion 5 5   Plantarflexion 5 5    Reflexes:  Right Left   Bicep 2+ 2+   Tricep 2+ 2+   BrRad 2+ 2+   Knee 2+ 2+   Ankle 1+ 1+    Pathological Reflexes: Babinski: flexor response bilaterally Hoffman: absent bilaterally Troemner: absent bilaterally Facial: ?present on left, absent on right Midline tap: absent Sensation: Pinprick: Intact in all extremities Coordination: Intact finger-to- nose-finger bilaterally. Mildly slow finger tapping though seems to fluctuate. Gait: Has cane with her today but can walk without it. Unable to rise from chair with arms crossed. Narrow based gait. Short steps, but not clearly shuffling. No freezing appreciate. En bloc turns, reduced arm swing.   Labs and Imaging review: New results: 03/25/23: TSH: wnl CBC w/ diff unremarkable CMP significant for glucose 106, AST 100, ALT 27  01/27/24: Copper  wnl Vit E wnl B6 elevated to 41.4 B12: 1988 B1 wnl  DATSCAN  (02/29/24): FINDINGS:  Good target to background ratio. Symmetric intense uptake within LEFT and RIGHT striata. The heads of the caudate nuclei and the posterior striata (putamen) are normal shape. No evidence of loss of dopamine transport populations in the basal ganglia.   IMPRESSION: 1. Normal DaTscan  without  evidence of striatal dopaminergic deficit.  Previously reviewed results: 01/03/24: TSH wnl Free T4 wnl   11/13/23: BMP unremarkable CBC unremarkable   07/08/23: AChR binding ab negative ESR 2 CK 64 ANA negative   Lipid panel (01/06/22): tChol 176, LDL 96, TG 111 HbA1c (05/23/21): 5.7   Imaging/Procedures: MRI cervical spine wo contrast (07/23/23): IMPRESSION: 1. No acute osseous abnormality in the cervical spine. 2. Chronic cervical spine degeneration with no significant spinal stenosis. Moderate to severe neural foraminal stenosis at the right C5 and left C6 nerve levels is in part related to bulky facet spurring.   MRI brain wo contrast (08/28/23): FINDINGS: No acute intracranial hemorrhage, mass, edema, or hydrocephalus. Mild cortical atrophy. No encephalomalacia. Mild white matter disease suggesting chronic small vessel ischemic change. The vascular flow voids are unremarkable. No significant sinus disease.   IMPRESSION: Mild age-related change without acute intracranial pathology.   MRI cervical spine w/wo contrast (11/13/23): FINDINGS: Alignment: Improved postoperative cervical lordosis. No significant spondylolisthesis, aside from mild chronic and degenerative anterolisthesis of C7 on T1.   Vertebrae: ACDF hardware artifact now C4 through C7, with normal background bone marrow signal. No suspicious marrow edema or enhancement.   Cord: Normal. No abnormal intradural enhancement. No dural thickening.   Posterior Fossa, vertebral arteries, paraspinal tissues: Cervicomedullary junction is within normal limits. Preserved major vascular flow voids in the bilateral neck.   Postoperative prevertebral and retropharyngeal space fluid or edema tracking throughout the cervical spine and just past the cervicothoracic junction (see series 3, image 12). This measures up to 5 mm in thickness above C6. At the C6 level postcontrast images do suggest a discrete fluid  collection there encompassing 7 x 29 x 29 mm (AP by transverse by CC), estimated volume 3 mL.   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 (ref: J am Coll Radiol. 2015 Feb;12(2): 143-50).   Otherwise small volume fluid and edema along the surgical approach from the right anterior neck as seen on series 6, image 22.   Disc levels:   No cervical spinal stenosis.   Mild disc bulging C2-C3 and C3-C4 is stable as detailed on the preoperative MRI. ACDF C4-C5 through C6-C7 with no spinal stenosis. Mild to moderate facet hypertrophy at C4-C5 with trace bilateral facet joint fluid. Moderate facet hypertrophy at C5-C6 greater on the right, trace facet joint fluid. The right C5 and left C6 neural foraminal patency appears stable to improved.   No visible upper thoracic spinal stenosis.   IMPRESSION: C4 through C7 ACDF with expected postoperative MRI appearance; small 3 mL prevertebral hematoma/seroma overlying the C6 and C7 vertebrae. No cervical spinal stenosis. And no progressive degeneration or stenosis from the preoperative MRI.  Assessment/Plan:  This is Amy Glenn, a 74 y.o. female with abnormal gait and freezing. When she was seen originally on 01/27/24, she was complaining of left sided weakness, though no weakness was noted on exam. Today patient has clear weakness of FDI with atrophy in the RIGHT hand. I did not note this on 01/27/24. I did not see fasciculations or other abnormalities. Given that patient's datscan  was normal and she has not clearly responded to Sinemet , I am considering other etiologies.  I will get an EMG of her RUE to evaluate the FDI weakness and atrophy and make sure there is not concern for a peripheral nerve issue but also not a larger neuromuscular disease to explain her difficulty with ambulation. I will expand her EMG to include the right leg and paraspinals if needed. In the meantime, we  will try to increase Sinemet  further as patient would like to continue to give it a try to help her.   Plan: -EMG of RUE - 04/11/24 at 10:30 am -Will increase Sinemet :  - 1 tablet at 7 am, 1 tablet at 11 am, 1.5 tablets at 4 pm for 1 week then,  - 1 tablet at 7 am, 1.5 tablets at 11 am, 1.5 tablets at 4 pm for 1 week, then  - 1.5 tablets three times daily (7 am, 11 am, 4 pm)  Return to clinic in ~3 months  Total time spent reviewing records, interview, history/exam, documentation, and coordination of care on day of encounter:  45 min  Venetia Potters, MD     [1] No Known Allergies [2]  Social History Tobacco Use   Smoking status: Never   Smokeless tobacco: Never  Vaping Use   Vaping status: Never Used  Substance Use Topics   Alcohol use: Not Currently    Comment: occ   Drug use: No   "

## 2024-03-24 NOTE — Progress Notes (Signed)
 "  Patient Care Team: Prentiss Frieze, DO as PCP - General (Family Medicine) Inocencio Soyla Lunger, MD as PCP - Electrophysiology (Cardiology) Sheldon Standing, MD as Consulting Physician (General Surgery) Burnette Fallow, MD as Consulting Physician (Gastroenterology) Dann Candyce RAMAN, MD as Consulting Physician (Cardiology) Colleen Lacinda SAUNDERS, NP as Nurse Practitioner (Gastroenterology) Prentiss Frieze, DO as Referring Physician (Family Medicine) Leigh Venetia CROME, MD as Consulting Physician (Neurology)  Subjective   History of Present Illness Amy Glenn is a 74 year old female who presents with medication-induced nausea and fatigue.  Medication-induced nausea - Intermittent nausea associated with current medications - Zofran  sublingual tablets previously effective but no longer available - Pepto Bismol ineffective for symptom relief  Fatigue - Marked fatigue, especially after taking morning medications at 7 AM - Requires returning to bed after morning medications - Persistent tiredness throughout the day - Takes multiple medications including low-dose Wellbutrin , Paxil , amitriptyline , and Ambien , which may contribute to fatigue - No missed medication doses  Tremor - New onset hand shaking involving some fingers - Concern regarding continuation of current medications due to this symptom  Cognitive impairment - Memory loss, including difficulty recalling names and medication details  Thyroid  dysfunction - History of thyroid  problems - Last thyroid  check in October  Functional status and activity tolerance - No regular exercise due to low energy and poor ambulatory ability - Feels safe driving  Glycemic control - Blood sugar has been stable  Review of Systems: Negative, with the exception of above mentioned in HPI.  History   Reviewed by clinician on day of visit: allergies, medications, problem list, medical history, surgical history, family history, social  history, and previous encounter notes.  Medications   Show/hide medication list[1] Allergies[2]  Objective   BP 112/74 (BP Location: Right Arm, Cuff Size: Large)   Pulse (!) 103   Ht 5' 7 (1.702 m)   Wt 193 lb (87.5 kg)   SpO2 97%   BMI 30.23 kg/m   Physical Exam Constitutional:      General: She is not in acute distress.    Appearance: She is well-developed.  HENT:     Head: Normocephalic and atraumatic.  Eyes:     Conjunctiva/sclera: Conjunctivae normal.  Cardiovascular:     Rate and Rhythm: Normal rate and regular rhythm.     Heart sounds: Normal heart sounds.  Pulmonary:     Effort: Pulmonary effort is normal.     Breath sounds: Normal breath sounds.  Neurological:     Mental Status: She is alert.  Psychiatric:        Behavior: Behavior normal.    Last CBC Lab Results  Component Value Date   WBC 5.7 03/24/2024   HGB 14.8 03/24/2024   HCT 43.8 03/24/2024   MCV 87.9 03/24/2024   MCH 30.3 11/13/2023   RDW 14.0 03/24/2024   PLT 298.0 03/24/2024   Last metabolic panel Lab Results  Component Value Date   GLUCOSE 106 (H) 03/24/2024   NA 141 03/24/2024   K 3.8 03/24/2024   CL 103 03/24/2024   CO2 29 03/24/2024   BUN 13 03/24/2024   CREATININE 0.76 03/24/2024   GFR 78.44 03/24/2024   CALCIUM  9.2 03/24/2024   PROT 6.6 03/24/2024   ALBUMIN 4.2 03/24/2024   LABGLOB 2.1 01/06/2022   AGRATIO 2.0 01/06/2022   BILITOT 0.4 03/24/2024   ALKPHOS 55 03/24/2024   AST 100 (H) 03/24/2024   ALT 27 03/24/2024   ANIONGAP 10 11/13/2023   Last  thyroid  functions Lab Results  Component Value Date   TSH 2.63 03/24/2024   FREET4 0.68 03/24/2024   Last vitamin B12 and Folate Lab Results  Component Value Date   VITAMINB12 1,988 (H) 01/27/2024   Results for orders placed or performed in visit on 03/24/24  Comprehensive metabolic panel with GFR   Collection Time: 03/24/24  9:56 AM  Result Value Ref Range   Sodium 141 135 - 145 mEq/L   Potassium 3.8 3.5 - 5.1  mEq/L   Chloride 103 96 - 112 mEq/L   CO2 29 19 - 32 mEq/L   Glucose, Bld 106 (H) 70 - 99 mg/dL   BUN 13 6 - 23 mg/dL   Creatinine, Ser 9.23 0.40 - 1.20 mg/dL   Total Bilirubin 0.4 0.2 - 1.2 mg/dL   Alkaline Phosphatase 55 39 - 117 U/L   AST 100 (H) 5 - 37 U/L   ALT 27 3 - 35 U/L   Total Protein 6.6 6.0 - 8.3 g/dL   Albumin 4.2 3.5 - 5.2 g/dL   GFR 21.55 >39.99 mL/min   Calcium  9.2 8.4 - 10.5 mg/dL  CBC with Differential/Platelet   Collection Time: 03/24/24  9:56 AM  Result Value Ref Range   WBC 5.7 4.0 - 10.5 K/uL   RBC 4.99 3.87 - 5.11 Mil/uL   Hemoglobin 14.8 12.0 - 15.0 g/dL   HCT 56.1 63.9 - 53.9 %   MCV 87.9 78.0 - 100.0 fl   MCHC 33.6 30.0 - 36.0 g/dL   RDW 85.9 88.4 - 84.4 %   Platelets 298.0 150.0 - 400.0 K/uL   Neutrophils Relative % 60.7 43.0 - 77.0 %   Lymphocytes Relative 27.9 12.0 - 46.0 %   Monocytes Relative 7.2 3.0 - 12.0 %   Eosinophils Relative 3.3 0.0 - 5.0 %   Basophils Relative 0.9 0.0 - 3.0 %   Neutro Abs 3.4 1.4 - 7.7 K/uL   Lymphs Abs 1.6 0.7 - 4.0 K/uL   Monocytes Absolute 0.4 0.1 - 1.0 K/uL   Eosinophils Absolute 0.2 0.0 - 0.7 K/uL   Basophils Absolute 0.1 0.0 - 0.1 K/uL  TSH   Collection Time: 03/24/24  9:56 AM  Result Value Ref Range   TSH 2.63 0.35 - 5.50 uIU/mL  T4, free   Collection Time: 03/24/24  9:56 AM  Result Value Ref Range   Free T4 0.68 0.60 - 1.60 ng/dL    Diagnoses and Orders   1. Hypothyroidism due to Hashimoto thyroiditis   2. Research study patient   3. Cognitive impairment   4. Adjustment disorder with mixed anxiety and depressed mood   5. Adverse effect of drug, initial encounter   6. Class 1 obesity with serious comorbidity and body mass index (BMI) of 30.0 to 30.9 in adult, unspecified obesity type    Meds ordered this encounter  Medications   buPROPion  (WELLBUTRIN  XL) 150 MG 24 hr tablet    Sig: Take 1 tablet (150 mg total) by mouth daily.    Dispense:  30 tablet    Refill:  1   ondansetron  (ZOFRAN -ODT) 4 MG  disintegrating tablet    Sig: Take 1 tablet (4 mg total) by mouth every 8 (eight) hours as needed for nausea or vomiting.    Dispense:  20 tablet    Refill:  0   Orders Placed This Encounter  Procedures   Comprehensive metabolic panel with GFR   CBC with Differential/Platelet   TSH   T4, free  Assessment and Plan   Assessment & Plan Cognitive impairment Memory loss and difficulty recalling names. Possible medication side effects and depression contribution. Previous thyroid  over-treatment could contribute. - Ordered lab work to assess thyroid  function and other parameters. - Consider therapy if Wellbutrin  adjustment improves cognitive function.  Adverse effects of medication Nausea and fatigue likely from Sinemet  and Wellbutrin . Nausea managed with Zofran  ODT. Fatigue possibly from Wellbutrin . - Prescribed Zofran  ODT for nausea. - Increased Wellbutrin  to 150 mg XL, potential increase to 300 mg. - Advised discussion with Dr. Leigh about medication necessity and alternatives.  Depression Contributing to fatigue and cognitive impairment. Current Wellbutrin  dose is lowest possible. - Increased Wellbutrin  to 150 mg XL, potential increase to 300 mg. - Monitor for improvement in depressive symptoms and cognitive function.  Autoimmune hypothyroidism Previous thyroid  over-treatment could contribute to cognitive issues. Current medication dose unchanged since October. - Ordered lab work to reassess thyroid  function.  General health maintenance Discussed exercise options to improve health and energy. Deferred stimulants for memory due to cardiac concerns. - Encouraged consideration of water  aerobics. - Monitor for potential use of stimulants for memory and alertness.  Geni Shutter, DO, MS, FAAFP, Dipl. [facility] [address] [place], [address] Dept: [phone] Dept Fax: [phone]  Attestations   Reviewed by clinician on  day of visit: allergies, medications, problem list, medical history, surgical history, family history, social history, and previous encounter notes. Discussed the use of AI scribe software for clinical note transcription with the patient, who gave verbal consent to proceed. As the patient's PCP, board-certified in [facility] Medicine and Obesity Medicine, I am providing ongoing, guideline-directed obesity management utilizing all pillars of care, including lifestyle intervention and FDA-approved anti-obesity pharmacotherapy.     [1]  Outpatient Medications Prior to Visit  Medication Sig   amitriptyline  (ELAVIL ) 10 MG tablet Take 10 mg by mouth at bedtime.   b complex vitamins capsule Take 1 capsule by mouth daily.   carbidopa -levodopa  (SINEMET  IR) 25-100 MG tablet Take 1/2 tablet three times daily, at least 30 minutes before meals (approximately 7am/11am/4pm) for one week, then take 1/2 tablet in the morning, 1/2 tablet in the afternoon, 1 tablet in the evening for one week, then take 1/2 tablet in the morning, 1 tablet in the afternoon, 1 tablet in the evening for one week, then take 1 tablet three times daily thereafter   dorzolamide -timolol  (COSOPT ) 22.3-6.8 MG/ML ophthalmic solution Place 1 drop into both eyes 2 (two) times daily.   ibuprofen  (ADVIL ) 200 MG tablet Take 200 mg by mouth as needed for moderate pain (pain score 4-6).   JOURNAVX 50 MG TABS Take by mouth.   lansoprazole (PREVACID) 15 MG capsule Take 15 mg by mouth daily.   levothyroxine  (SYNTHROID ) 75 MCG tablet Take 75 mcg by mouth every morning.   metFORMIN  (GLUCOPHAGE -XR) 500 MG 24 hr tablet Take 500 mg by mouth daily with supper.   Multiple Vitamin (MULTIVITAMIN WITH MINERALS) TABS tablet Take 1 tablet by mouth daily.   PARoxetine  (PAXIL ) 20 MG tablet Take 20 mg by mouth every morning.   rosuvastatin  (CRESTOR ) 10 MG tablet TAKE 1 TABLET BY MOUTH EVERY DAY AT BEDTIME   vitamin E  45 MG (100 UNITS) capsule Take 100 Units by mouth  daily.   zolpidem  (AMBIEN ) 5 MG tablet Take 5 mg by mouth at bedtime. (Patient taking differently: Take 5 mg by mouth as needed. At bed time as needed)   [DISCONTINUED] buPROPion  ER (  WELLBUTRIN  SR) 100 MG 12 hr tablet Take 100 mg by mouth every morning.   No facility-administered medications prior to visit.  [2] No Known Allergies  "

## 2024-03-26 NOTE — Progress Notes (Unsigned)
" °  Electrophysiology Office Note:   Date:  03/26/2024  ID:  Amy Glenn, DOB [birth year], MRN [id number]  Primary Cardiologist: None Primary Heart Failure: None Electrophysiologist: Will Gladis Norton, MD  {Click to update primary MD,subspecialty MD or APP then REFRESH:1}    History of Present Illness:   Amy Glenn is a 73 y.o. female with h/o PAC's, PVC's, SVT, GERD, hypothyroidism seen today for routine electrophysiology followup.   Seen in EP Clinic 01/03/24 for ILR alert of SVT and new onset AF.  She had undergone an anterior spine surgery around the same time of the events.   Since last being seen in our clinic the patient reports doing ***.    She *** denies chest pain, palpitations, dyspnea, PND, orthopnea, nausea, vomiting, dizziness, syncope, edema, weight gain, or early satiety.   Review of systems complete and found to be negative unless listed in HPI.   EP Information / Studies Reviewed:    EKG is not ordered today. EKG from 11/16/23 reviewed which showed NSR 88 bpm      Arrhythmia / AAD / Pertinent EP Studies SVT  PAC's, PVC's  Toprol  > self stopped due to fatigue   Device  MDT LNQ II implanted 08/24/22, surveillance of palpitations, near syncope  Risk Assessment/Calculations:    CHA2DS2-VASc Score = 2  {Confirm score is correct.  If not, click here to update score.  REFRESH note.  :1} This indicates a 2.2% annual risk of stroke. The patient's score is based upon: CHF History: 0 HTN History: 0 Diabetes History: 0 Stroke History: 0 Vascular Disease History: 0 Age Score: 1 Gender Score: 1     No BP recorded.  {Refresh Note OR Click here to enter BP  :1}***        Physical Exam:   VS:  There were no vitals taken for this visit.   Wt Readings from Last 3 Encounters:  03/24/24 193 lb (87.5 kg)  03/10/24 196 lb 6.4 oz (89.1 kg)  01/27/24 195 lb (88.5 kg)     GEN: Well nourished, well developed in no acute distress NECK: No JVD; No carotid  bruits CARDIAC: {EPRHYTHM:28826}, no murmurs, rubs, gallops RESPIRATORY:  Clear to auscultation without rales, wheezing or rhonchi  ABDOMEN: Soft, non-tender, non-distended EXTREMITIES:  No edema; No deformity   ASSESSMENT AND PLAN:    Palpitations SVT PVC's/PAC's  New SCAF  CHA2DS2-VASc 2 (age / female) -ILR review shows ***  -Cardizem  120mg  daily for SVT > better?*** -not on OAC due to low burden / low risk score, see discussion 01/03/24 -symptom burden improved ***    Follow up with EP APP {EPFOLLOW LE:71826}  Signed, Daphne Barrack, NP-C, AGACNP-BC [facility] - Electrophysiology  03/26/2024, 10:58 AM  "

## 2024-03-27 ENCOUNTER — Encounter

## 2024-03-27 LAB — GENECONNECT MOLECULAR SCREEN

## 2024-03-28 ENCOUNTER — Ambulatory Visit: Admitting: Pulmonary Disease

## 2024-03-28 DIAGNOSIS — I480 Paroxysmal atrial fibrillation: Secondary | ICD-10-CM

## 2024-03-28 DIAGNOSIS — R002 Palpitations: Secondary | ICD-10-CM

## 2024-03-28 DIAGNOSIS — Z95818 Presence of other cardiac implants and grafts: Secondary | ICD-10-CM

## 2024-03-28 DIAGNOSIS — I471 Supraventricular tachycardia, unspecified: Secondary | ICD-10-CM

## 2024-03-28 DIAGNOSIS — I493 Ventricular premature depolarization: Secondary | ICD-10-CM

## 2024-03-29 ENCOUNTER — Other Ambulatory Visit: Payer: Self-pay | Admitting: Medical Genetics

## 2024-03-29 DIAGNOSIS — Z006 Encounter for examination for normal comparison and control in clinical research program: Secondary | ICD-10-CM

## 2024-04-03 ENCOUNTER — Encounter: Payer: Self-pay | Admitting: Family Medicine

## 2024-04-03 ENCOUNTER — Ambulatory Visit: Payer: Self-pay | Admitting: Family Medicine

## 2024-04-03 DIAGNOSIS — T50905A Adverse effect of unspecified drugs, medicaments and biological substances, initial encounter: Secondary | ICD-10-CM | POA: Insufficient documentation

## 2024-04-03 DIAGNOSIS — Z006 Encounter for examination for normal comparison and control in clinical research program: Secondary | ICD-10-CM | POA: Insufficient documentation

## 2024-04-03 DIAGNOSIS — F4323 Adjustment disorder with mixed anxiety and depressed mood: Secondary | ICD-10-CM | POA: Insufficient documentation

## 2024-04-05 ENCOUNTER — Encounter: Payer: Self-pay | Admitting: Neurology

## 2024-04-05 ENCOUNTER — Ambulatory Visit: Payer: Self-pay | Admitting: Neurology

## 2024-04-05 VITALS — BP 126/84 | HR 97 | Ht 67.0 in | Wt 195.0 lb

## 2024-04-05 DIAGNOSIS — R258 Other abnormal involuntary movements: Secondary | ICD-10-CM

## 2024-04-05 DIAGNOSIS — R251 Tremor, unspecified: Secondary | ICD-10-CM | POA: Diagnosis not present

## 2024-04-05 DIAGNOSIS — R29898 Other symptoms and signs involving the musculoskeletal system: Secondary | ICD-10-CM

## 2024-04-05 MED ORDER — CARBIDOPA-LEVODOPA 25-100 MG PO TABS: ORAL_TABLET | ORAL | 5 refills | Status: AC

## 2024-04-05 NOTE — Patient Instructions (Addendum)
-  EMG of right arm to look into why it is weak - 04/11/24 at 10:30 am (see more information below)  -Will increase Sinemet :  - 1 tablet at 7 am, 1 tablet at 11 am, 1.5 tablets at 4 pm for 1 week then,  - 1 tablet at 7 am, 1.5 tablets at 11 am, 1.5 tablets at 4 pm for 1 week, then  - 1.5 tablets three times daily (7 am, 11 am, 4 pm)  Return to clinic on 06/29/24 at 2:30 pm  Please let me know if you have any questions or concerns in the meantime.  The physicians and staff at [facility] are committed to providing excellent care. You may receive a survey requesting feedback about your experience at our office. We strive to receive very good responses to the survey questions. If you feel that your experience would prevent you from giving the office a very good  response, please contact our office to try to remedy the situation. We may be reached at [phone]. Thank you for taking the time out of your busy day to complete the survey.  Venetia Potters, MD  Neurology  ELECTROMYOGRAM AND NERVE CONDUCTION STUDIES (EMG/NCS) INSTRUCTIONS  How to Prepare The neurologist conducting the EMG will need to know if you have certain medical conditions. Tell the neurologist and other EMG lab personnel if you: Have a pacemaker or any other electrical medical device Take blood-thinning medications Have hemophilia, a blood-clotting disorder that causes prolonged bleeding Bathing Take a shower or bath shortly before your exam in order to remove oils from your skin. Dont apply lotions or creams before the exam.  What to Expect Youll likely be asked to change into a hospital gown for the procedure and lie down on an examination table. The following explanations can help you understand what will happen during the exam.  Electrodes. The neurologist or a technician places surface electrodes at various locations on your skin depending on where youre experiencing symptoms. Or the neurologist may insert  needle electrodes at different sites depending on your symptoms.  Sensations. The electrodes will at times transmit a tiny electrical current that you may feel as a twinge or spasm. The needle electrode may cause discomfort or pain that usually ends shortly after the needle is removed. If you are concerned about discomfort or pain, you may want to talk to the neurologist about taking a short break during the exam.  Instructions. During the needle EMG, the neurologist will assess whether there is any spontaneous electrical activity when the muscle is at rest - activity that isnt present in healthy muscle tissue - and the degree of activity when you slightly contract the muscle.  He or she will give you instructions on resting and contracting a muscle at appropriate times. Depending on what muscles and nerves the neurologist is examining, he or she may ask you to change positions during the exam.  After your EMG You may experience some temporary, minor bruising where the needle electrode was inserted into your muscle. This bruising should fade within several days. If it persists, contact your primary care doctor.

## 2024-04-06 ENCOUNTER — Other Ambulatory Visit: Payer: Self-pay

## 2024-04-06 DIAGNOSIS — R202 Paresthesia of skin: Secondary | ICD-10-CM

## 2024-04-09 LAB — GENECONNECT MOLECULAR SCREEN: Genetic Analysis Overall Interpretation: NEGATIVE

## 2024-04-11 ENCOUNTER — Telehealth: Payer: Self-pay | Admitting: Neurology

## 2024-04-11 ENCOUNTER — Ambulatory Visit: Payer: Self-pay | Admitting: Neurology

## 2024-04-11 DIAGNOSIS — R202 Paresthesia of skin: Secondary | ICD-10-CM | POA: Diagnosis not present

## 2024-04-11 DIAGNOSIS — R29898 Other symptoms and signs involving the musculoskeletal system: Secondary | ICD-10-CM

## 2024-04-11 NOTE — Procedures (Signed)
 " [facility]  [address] [place], [address]  [place], [place] [address] Tel: [phone] Fax: [phone] Test Date:  04/11/2024  Patient: Amy Glenn DOB: 20-Feb-1951 Physician: Venetia Potters, MD  Sex: Female Height: 5' 7 Ref Phys: Venetia Potters, MD  ID#: [id number] Temp:  35.4 C Technician:    History: This is a 74 year old female with right hand weakness, imbalance, and stiffness.  NCV & EMG Findings: Extensive electrodiagnostic evaluation of the right upper limb shows: Right median, ulnar, and radial sensory responses are within normal limits. Right ulnar (FDI) motor response shows reduced amplitude (2.1 mV). Right median (APB) and ulnar (ADM) motor responses are within normal limits. Active denervation changes with no activation or motor units are seen in the right first dorsal interosseous muscle. All other tested muscles are within normal limits with normal motor unit configuration and recruitment pattern. No fasciculations seen in any tested muscles.  Impression: This study is most consistent with the following: Active denervation, no motor units or activation, and atrophy are seen isolated to the right first dorsal interosseous muscle on needle examination, which corresponding low amplitude motor nerve conduction study to that muscle, that is of unclear significance. This finding is too limited in degree and distribution for diagnostic purposes. Given that all other ulnar innervated muscles are normal and ulnar sensory response is normal, ulnar neuropathy is less likely. No definitive electrodiagnostic evidence of a right cervical (C5-C8) motor radiculopathy. No definitive electrodiagnostic evidence of an evolving disorder of anterior horn cells in the right upper limb at this time.    ___________________________ Venetia Potters, MD    Nerve Conduction Studies Motor Nerve Results    Latency Amplitude F-Lat Segment Distance CV Comment  Site (ms) Norm (mV) Norm (ms)   (cm) (m/s) Norm   Right Median (APB) Motor  Wrist 2.8  < 4.0 6.4  > 5.0      moved electrodes x4; could not eliminate dip  Elbow 7.5 - 6.4 -  Elbow-Wrist 26 55  > 50   Right Ulnar (ADM) Motor  Wrist 1.88  < 3.1 9.5  > 7.0        Bel elbow 5.4 - 8.7 -  Bel elbow-Wrist 20.5 59  > 50   Ab elbow 7.1 - 8.4 -  Ab elbow-Bel elbow 10 59 -   Right Ulnar (FDI) Motor  Wrist 2.6  < 4.5 *2.1  > 7.0         Sensory Sites    Neg Peak Lat Amplitude (O-P) Segment Distance Velocity Comment  Site (ms) Norm (V) Norm  (cm) (ms)   Right Median Sensory  Wrist-Dig II 3.2  < 3.8 37  > 10 Wrist-Dig II 13    Right Radial Sensory  Forearm-Wrist 2.1  < 2.8 15  > 10 Forearm-Wrist 10    Right Ulnar Sensory  Wrist-Dig V 2.7  < 3.2 22  > 5 Wrist-Dig V 11     Electromyography   Side Muscle Ins.Act Fibs Fasc Recrt Amp Dur Poly Activation Comment  Right FDI Nml *1+ Nml *None *- *- *- *None *ATR  Right ADM Nml Nml Nml Nml Nml Nml Nml Nml N/A  Right EIP Nml Nml Nml Nml Nml Nml Nml Nml N/A  Right Pronator teres Nml Nml Nml Nml Nml Nml Nml Nml N/A  Right Biceps Nml Nml Nml Nml Nml Nml Nml Nml N/A  Right Triceps lat hd Nml Nml Nml Nml Nml Nml Nml Nml N/A  Right  Deltoid Nml Nml Nml Nml Nml Nml Nml Nml N/A      Waveforms:  Motor        Sensory        "

## 2024-04-11 NOTE — Telephone Encounter (Signed)
 Discussed the results of patient's EMG after the procedure today. It showed isolated active deinnervation without reinnervation of the right first dorsal interosseous muscle, which is the weak and atrophied muscle on examination. Other ulnar or C8 muscles are normal, so in isolation, this finding is of unclear significance.  Of note, I was concerned this could represent split hand, however there is no current evidence of an evolving disorder of anterior horn cells (motor neuron disease).  All question were answered.  Venetia Potters, MD [facility]

## 2024-04-13 ENCOUNTER — Telehealth: Payer: Self-pay | Admitting: Neurology

## 2024-04-13 ENCOUNTER — Encounter: Payer: Self-pay | Admitting: Neurology

## 2024-04-13 NOTE — Telephone Encounter (Signed)
 Amy Glenn( Self) lvm stating she is returning a call from the office.  PH: Did not leave.

## 2024-04-13 NOTE — Telephone Encounter (Signed)
 Attempted to call patient to discuss future directions of care after testing. I left a message asking for a call back to our office.  Venetia Potters, MD [facility]

## 2024-04-14 ENCOUNTER — Telehealth: Payer: Self-pay | Admitting: Neurology

## 2024-04-14 NOTE — Telephone Encounter (Signed)
 Called patient back to discuss her case. I am still uncertain the etiology of her imbalance, freezing, felling stiff. She did have cervical spine surgery, so perhaps residuals of cervical spine disease could contribute. She also has some signs of parkinsonism, but a normal DAT scan and no significant response to Sinemet  to date (currently on 1.5 tablets TID). She is not currently on nor has seen ever been on medications that would be concerning for drug induced parkinsonism that would have a normal DAT scan. Her EMG did not show a significant neuromuscular cause (like suggestion of motor neuron disease).  After discussion, we agreed to continue Sinemet  and continue to monitor closely. Patient will let me know if there are any significant changes in her symptoms.  All questions were answered.  Venetia Potters, MD [facility]

## 2024-04-15 ENCOUNTER — Ambulatory Visit

## 2024-04-17 ENCOUNTER — Ambulatory Visit

## 2024-04-18 LAB — CUP PACEART REMOTE DEVICE CHECK
Date Time Interrogation Session: 20260201233839
Implantable Pulse Generator Implant Date: 20240601

## 2024-04-20 ENCOUNTER — Other Ambulatory Visit

## 2024-04-20 ENCOUNTER — Other Ambulatory Visit: Payer: Self-pay | Admitting: Nurse Practitioner

## 2024-04-27 ENCOUNTER — Ambulatory Visit: Admitting: Family Medicine

## 2024-05-02 ENCOUNTER — Ambulatory Visit: Admitting: Family Medicine

## 2024-05-02 ENCOUNTER — Ambulatory Visit: Admitting: Neurology

## 2024-05-11 ENCOUNTER — Ambulatory Visit: Admitting: Family Medicine

## 2024-05-18 ENCOUNTER — Ambulatory Visit

## 2024-05-19 ENCOUNTER — Ambulatory Visit: Admitting: Physician Assistant

## 2024-06-01 ENCOUNTER — Ambulatory Visit: Admitting: Neurology

## 2024-06-18 ENCOUNTER — Ambulatory Visit

## 2024-06-29 ENCOUNTER — Ambulatory Visit: Payer: Self-pay | Admitting: Neurology

## 2024-07-19 ENCOUNTER — Ambulatory Visit

## 2024-08-19 ENCOUNTER — Ambulatory Visit

## 2024-09-19 ENCOUNTER — Ambulatory Visit

## 2024-10-20 ENCOUNTER — Ambulatory Visit

## 2024-11-20 ENCOUNTER — Ambulatory Visit

## 2024-12-21 ENCOUNTER — Ambulatory Visit

## 2025-01-21 ENCOUNTER — Ambulatory Visit

## 2025-02-21 ENCOUNTER — Ambulatory Visit

## 2025-03-24 ENCOUNTER — Ambulatory Visit
